# Patient Record
Sex: Female | Born: 1960 | Race: Black or African American | Hispanic: No | Marital: Single | Smoking: Former smoker
Health system: Southern US, Community
[De-identification: ages and names within clinical notes are randomized; demographics above are authoritative.]

## PROBLEM LIST (undated history)

## (undated) DIAGNOSIS — I509 Heart failure, unspecified: Secondary | ICD-10-CM

## (undated) DIAGNOSIS — F329 Major depressive disorder, single episode, unspecified: Secondary | ICD-10-CM

## (undated) DIAGNOSIS — F32A Depression, unspecified: Secondary | ICD-10-CM

## (undated) DIAGNOSIS — R519 Headache, unspecified: Secondary | ICD-10-CM

## (undated) DIAGNOSIS — I272 Pulmonary hypertension, unspecified: Secondary | ICD-10-CM

## (undated) DIAGNOSIS — G8929 Other chronic pain: Secondary | ICD-10-CM

## (undated) DIAGNOSIS — C310 Malignant neoplasm of maxillary sinus: Secondary | ICD-10-CM

## (undated) DIAGNOSIS — I712 Thoracic aortic aneurysm, without rupture, unspecified: Secondary | ICD-10-CM

## (undated) DIAGNOSIS — C300 Malignant neoplasm of nasal cavity: Secondary | ICD-10-CM

## (undated) DIAGNOSIS — F319 Bipolar disorder, unspecified: Secondary | ICD-10-CM

## (undated) DIAGNOSIS — Z9981 Dependence on supplemental oxygen: Secondary | ICD-10-CM

## (undated) DIAGNOSIS — E662 Morbid (severe) obesity with alveolar hypoventilation: Secondary | ICD-10-CM

## (undated) DIAGNOSIS — M549 Dorsalgia, unspecified: Secondary | ICD-10-CM

## (undated) DIAGNOSIS — R0789 Other chest pain: Secondary | ICD-10-CM

## (undated) DIAGNOSIS — E785 Hyperlipidemia, unspecified: Secondary | ICD-10-CM

## (undated) DIAGNOSIS — I209 Angina pectoris, unspecified: Secondary | ICD-10-CM

## (undated) DIAGNOSIS — R0600 Dyspnea, unspecified: Secondary | ICD-10-CM

## (undated) DIAGNOSIS — M25569 Pain in unspecified knee: Secondary | ICD-10-CM

## (undated) DIAGNOSIS — I2781 Cor pulmonale (chronic): Secondary | ICD-10-CM

## (undated) DIAGNOSIS — R51 Headache: Secondary | ICD-10-CM

## (undated) HISTORY — DX: Malignant neoplasm of nasal cavity: C30.0

## (undated) HISTORY — DX: Depression, unspecified: F32.A

## (undated) HISTORY — DX: Heart failure, unspecified: I50.9

## (undated) HISTORY — DX: Morbid (severe) obesity due to excess calories: E66.01

## (undated) HISTORY — DX: Morbid (severe) obesity with alveolar hypoventilation: E66.2

## (undated) HISTORY — DX: Major depressive disorder, single episode, unspecified: F32.9

## (undated) HISTORY — PX: RIGHT OOPHORECTOMY: SHX2359

## (undated) HISTORY — DX: Other chronic pain: G89.29

## (undated) HISTORY — DX: Bipolar disorder, unspecified: F31.9

## (undated) HISTORY — DX: Dorsalgia, unspecified: M54.9

## (undated) HISTORY — DX: Pain in unspecified knee: M25.569

## (undated) HISTORY — PX: TONSILLECTOMY: SUR1361

---

## 2004-06-22 ENCOUNTER — Emergency Department: Payer: Self-pay | Admitting: General Practice

## 2004-11-20 ENCOUNTER — Emergency Department: Payer: Self-pay | Admitting: Emergency Medicine

## 2004-11-22 ENCOUNTER — Inpatient Hospital Stay: Payer: Self-pay | Admitting: Psychiatry

## 2004-12-03 ENCOUNTER — Inpatient Hospital Stay: Payer: Self-pay | Admitting: Psychiatry

## 2004-12-18 ENCOUNTER — Emergency Department: Payer: Self-pay | Admitting: Emergency Medicine

## 2006-06-14 ENCOUNTER — Emergency Department: Payer: Self-pay | Admitting: Emergency Medicine

## 2006-08-27 ENCOUNTER — Emergency Department: Payer: Self-pay | Admitting: Unknown Physician Specialty

## 2006-12-23 ENCOUNTER — Other Ambulatory Visit: Payer: Self-pay

## 2006-12-23 ENCOUNTER — Inpatient Hospital Stay: Payer: Self-pay | Admitting: Internal Medicine

## 2007-12-22 ENCOUNTER — Emergency Department: Payer: Self-pay | Admitting: Emergency Medicine

## 2008-01-02 ENCOUNTER — Inpatient Hospital Stay: Payer: Self-pay | Admitting: Internal Medicine

## 2008-01-02 ENCOUNTER — Other Ambulatory Visit: Payer: Self-pay

## 2008-06-04 ENCOUNTER — Inpatient Hospital Stay: Payer: Self-pay | Admitting: Internal Medicine

## 2008-06-04 ENCOUNTER — Other Ambulatory Visit: Payer: Self-pay

## 2008-06-10 ENCOUNTER — Emergency Department: Payer: Self-pay | Admitting: Emergency Medicine

## 2008-06-10 ENCOUNTER — Other Ambulatory Visit: Payer: Self-pay

## 2009-12-30 ENCOUNTER — Inpatient Hospital Stay: Payer: Self-pay | Admitting: Internal Medicine

## 2010-03-07 ENCOUNTER — Inpatient Hospital Stay: Payer: Self-pay | Admitting: Internal Medicine

## 2011-11-11 ENCOUNTER — Emergency Department: Payer: Self-pay | Admitting: *Deleted

## 2012-01-18 ENCOUNTER — Emergency Department: Payer: Self-pay | Admitting: Emergency Medicine

## 2012-03-19 ENCOUNTER — Emergency Department: Payer: Self-pay | Admitting: Emergency Medicine

## 2012-03-19 LAB — CBC
HCT: 37.5 % (ref 35.0–47.0)
HGB: 11.3 g/dL — ABNORMAL LOW (ref 12.0–16.0)
MCV: 85 fL (ref 80–100)
RBC: 4.4 10*6/uL (ref 3.80–5.20)
WBC: 12.2 10*3/uL — ABNORMAL HIGH (ref 3.6–11.0)

## 2012-03-19 LAB — BASIC METABOLIC PANEL
BUN: 28 mg/dL — ABNORMAL HIGH (ref 7–18)
Chloride: 102 mmol/L (ref 98–107)
Co2: 34 mmol/L — ABNORMAL HIGH (ref 21–32)
Creatinine: 0.89 mg/dL (ref 0.60–1.30)
Potassium: 3.9 mmol/L (ref 3.5–5.1)
Sodium: 142 mmol/L (ref 136–145)

## 2012-03-19 LAB — CK TOTAL AND CKMB (NOT AT ARMC)
CK, Total: 64 U/L (ref 21–215)
CK-MB: 1.3 ng/mL (ref 0.5–3.6)

## 2012-03-19 LAB — TROPONIN I: Troponin-I: <0.02 ng/mL

## 2012-03-25 ENCOUNTER — Emergency Department: Payer: Self-pay | Admitting: Unknown Physician Specialty

## 2012-03-25 LAB — CBC
HCT: 38.8 % (ref 35.0–47.0)
Platelet: 187 10*3/uL (ref 150–440)
RBC: 4.66 10*6/uL (ref 3.80–5.20)
RDW: 16.3 % — ABNORMAL HIGH (ref 11.5–14.5)
WBC: 10 10*3/uL (ref 3.6–11.0)

## 2012-03-25 LAB — URINALYSIS, COMPLETE
Bilirubin,UR: NEGATIVE
Blood: NEGATIVE
Glucose,UR: NEGATIVE mg/dL (ref 0–75)
Ketone: NEGATIVE
Protein: NEGATIVE
Specific Gravity: 1.005 (ref 1.003–1.030)
Squamous Epithelial: 1

## 2012-03-25 LAB — COMPREHENSIVE METABOLIC PANEL
Albumin: 3 g/dL — ABNORMAL LOW (ref 3.4–5.0)
Alkaline Phosphatase: 75 U/L (ref 50–136)
BUN: 15 mg/dL (ref 7–18)
Bilirubin,Total: 0.3 mg/dL (ref 0.2–1.0)
Chloride: 97 mmol/L — ABNORMAL LOW (ref 98–107)
Creatinine: 0.72 mg/dL (ref 0.60–1.30)
SGOT(AST): 18 U/L (ref 15–37)
SGPT (ALT): 22 U/L
Total Protein: 7.1 g/dL (ref 6.4–8.2)

## 2012-03-25 LAB — MAGNESIUM: Magnesium: 1.7 mg/dL — ABNORMAL LOW

## 2012-04-22 ENCOUNTER — Encounter: Payer: Self-pay | Admitting: Cardiovascular Disease

## 2012-04-22 ENCOUNTER — Ambulatory Visit: Payer: Self-pay | Admitting: Cardiovascular Disease

## 2012-04-30 ENCOUNTER — Ambulatory Visit (INDEPENDENT_AMBULATORY_CARE_PROVIDER_SITE_OTHER): Payer: Medicaid Other | Admitting: Cardiovascular Disease

## 2012-04-30 ENCOUNTER — Encounter: Payer: Self-pay | Admitting: Cardiovascular Disease

## 2012-04-30 VITALS — BP 118/70 | HR 78 | Ht 64.0 in | Wt 349.2 lb

## 2012-04-30 DIAGNOSIS — I509 Heart failure, unspecified: Secondary | ICD-10-CM | POA: Insufficient documentation

## 2012-04-30 DIAGNOSIS — R0602 Shortness of breath: Secondary | ICD-10-CM

## 2012-04-30 DIAGNOSIS — R011 Cardiac murmur, unspecified: Secondary | ICD-10-CM

## 2012-04-30 NOTE — Assessment & Plan Note (Signed)
The patient has clinical evidence of congestive heart failure. Ejection fraction is not known. I recommend an echocardiogram. She also has a cardiac murmur suggestive of an aortic etiology but there does not seem to be severe stenosis. Her functional capacity is significantly reduced due to morbid obesity. She appears to be in [place] Heart Association class III. Some of her symptoms also might be related to obesity hypoventilation syndrome. Continue current dose of furosemide. Depending on the echo findings, she might require ischemic cardiac evaluation. However, stress testing might be somewhat limited in her situation due to morbid obesity. Further medications if needed will be added based on the results of the echocardiogram and whether she has LV systolic dysfunction. Low-sodium diet is recommended as well as weight loss.

## 2012-04-30 NOTE — Patient Instructions (Addendum)
Your physician has requested that you have an echocardiogram. Echocardiography is a painless test that uses sound waves to create images of your heart. It provides your doctor with information about the size and shape of your heart and how well your heart's chambers and valves are working. This procedure takes approximately one hour. There are no restrictions for this procedure.  Follow up after echo.  

## 2012-04-30 NOTE — Progress Notes (Signed)
HPI  This is a 51 year old African American female who was referred by Dr. Mayford Knife for evaluation of congestive heart failure. She has chronic medical conditions that include morbid obesity, type 2 diabetes and obesity hypoventilation syndrome with few admissions at [facility] for hypoxia mostly related to this. However, she also has known history of congestive heart failure with unknown ejection fraction. She frequently requires IV diuretics during her hospitalizations. She had some emergency room visits recently to [facility] due to increased dyspnea. BNP was found to be elevated at 450 with chest x-ray finding suggestive of pulmonary vascular congestion. She was given IV furosemide and discharged home. She denies any chest pain. She reports dyspnea with minimal activities as well as orthopnea without PND. She also gets frequent lower extremity edema.  No Known Allergies   Current Outpatient Prescriptions on File Prior to Visit  Medication Sig Dispense Refill  . ARIPiprazole (ABILIFY) 10 MG tablet Take 10 mg by mouth daily.      . divalproex (DEPAKOTE) 500 MG DR tablet Take 500 mg by mouth at bedtime.      . furosemide (LASIX) 40 MG tablet Take 40 mg by mouth 2 (two) times daily.      . metFORMIN (GLUCOPHAGE) 500 MG tablet Take 500 mg by mouth 2 (two) times daily with a meal.         Past Medical History  Diagnosis Date  . Bipolar disorder   . Chronic back pain   . Chronic knee pain   . Morbid obesity   . Obesity hypoventilation syndrome   . CHF (congestive heart failure)   . Depression   . Diabetes mellitus     Type II     Past Surgical History  Procedure Date  . Right oophorectomy      History reviewed. No pertinent family history.   History   Social History  . Marital Status: Unknown    Spouse Name: N/A    Number of Children: N/A  . Years of Education: N/A   Occupational History  . Not on file.   Social History Main Topics  . Smoking status: Former Games developer  .  Smokeless tobacco: Not on file  . Alcohol Use: Yes  . Drug Use: No  . Sexually Active: Not on file   Other Topics Concern  . Not on file   Social History Narrative  . No narrative on file     ROS Constitutional: Negative for fever, chills, diaphoresis, activity change, appetite change.  HENT: Negative for hearing loss, nosebleeds, congestion, sore throat, facial swelling, drooling, trouble swallowing, neck pain, voice change, sinus pressure and tinnitus.  Eyes: Negative for photophobia, pain, discharge and visual disturbance.  Respiratory: Negative for apnea, cough, chest tightness and wheezing.  Cardiovascular: Negative for chest pain, palpitations. Gastrointestinal: Negative for nausea, vomiting, abdominal pain, diarrhea, constipation, blood in stool and abdominal distention.  Genitourinary: Negative for dysuria, urgency, frequency, hematuria and decreased urine volume.  Musculoskeletal: Negative for myalgias, back pain, joint swelling, arthralgias and gait problem.  Skin: Negative for color change, pallor, rash and wound.  Neurological: Negative for dizziness, tremors, seizures, syncope, speech difficulty, weakness, light-headedness, numbness and headaches.  Psychiatric/Behavioral: Negative for suicidal ideas, hallucinations, behavioral problems and agitation. The patient is not nervous/anxious.     PHYSICAL EXAM   BP 118/70  Pulse 78  Ht 5\' 4"  (1.626 m)  Wt 349 lb 4 oz (158.419 kg)  BMI 59.95 kg/m2 Constitutional: She is oriented to person, place, and time.  She  Is morbidly obese. No distress.  HENT: No nasal discharge.  Head: Normocephalic and atraumatic.  Eyes: Pupils are equal and round. Right eye exhibits no discharge. Left eye exhibits no discharge.  Neck: Normal range of motion. Neck supple. No JVD present. No thyromegaly present.  Cardiovascular: Normal rate, regular rhythm, normal heart sounds. Exam reveals no gallop and no friction rub. There is a 2/6 systolic  ejection murmur at the aortic area which is early peaking.  Pulmonary/Chest: Effort normal and breath sounds normal. No stridor. No respiratory distress. She has no wheezes. She has no rales. She exhibits no tenderness.  Abdominal: Soft. Bowel sounds are normal. She exhibits no distension. There is no tenderness. There is no rebound and no guarding.  Musculoskeletal: Normal range of motion. She exhibits trace edema and no tenderness.  Neurological: She is alert and oriented to person, place, and time. Coordination normal.  Skin: Skin is warm and dry. No rash noted. She is not diaphoretic. No erythema. No pallor.  Psychiatric: She has a normal mood and affect. Her behavior is normal. Judgment and thought content normal.     EKG: Sinus  Rhythm   Low voltage in precordial leads.    ASSESSMENT AND PLAN

## 2012-05-07 ENCOUNTER — Other Ambulatory Visit (INDEPENDENT_AMBULATORY_CARE_PROVIDER_SITE_OTHER): Payer: Medicaid Other

## 2012-05-07 ENCOUNTER — Other Ambulatory Visit: Payer: Self-pay

## 2012-05-07 DIAGNOSIS — R0602 Shortness of breath: Secondary | ICD-10-CM

## 2012-05-07 DIAGNOSIS — R011 Cardiac murmur, unspecified: Secondary | ICD-10-CM

## 2012-05-07 DIAGNOSIS — R609 Edema, unspecified: Secondary | ICD-10-CM

## 2012-05-14 ENCOUNTER — Ambulatory Visit: Payer: Medicaid Other | Admitting: Cardiovascular Disease

## 2012-05-30 ENCOUNTER — Ambulatory Visit: Payer: Medicaid Other | Admitting: Cardiovascular Disease

## 2012-06-21 ENCOUNTER — Ambulatory Visit: Payer: Medicaid Other | Admitting: Cardiovascular Disease

## 2012-06-25 ENCOUNTER — Encounter: Payer: Self-pay | Admitting: Cardiovascular Disease

## 2013-12-15 LAB — URINALYSIS, COMPLETE
BACTERIA: NONE SEEN
Bilirubin,UR: NEGATIVE
Blood: NEGATIVE
GLUCOSE, UR: NEGATIVE mg/dL (ref 0–75)
Ketone: NEGATIVE
LEUKOCYTE ESTERASE: NEGATIVE
NITRITE: NEGATIVE
Ph: 6 (ref 4.5–8.0)
Protein: NEGATIVE
RBC,UR: NONE SEEN /HPF (ref 0–5)
Specific Gravity: 1.004 (ref 1.003–1.030)
Squamous Epithelial: NONE SEEN
WBC UR: NONE SEEN /HPF (ref 0–5)

## 2013-12-15 LAB — DRUG SCREEN, URINE

## 2013-12-15 LAB — ETHANOL

## 2013-12-15 LAB — COMPREHENSIVE METABOLIC PANEL
Albumin: 3.4 g/dL (ref 3.4–5.0)
Alkaline Phosphatase: 87 U/L
Anion Gap: 4 — ABNORMAL LOW (ref 7–16)
BUN: 14 mg/dL (ref 7–18)
Bilirubin,Total: 0.3 mg/dL (ref 0.2–1.0)
CALCIUM: 8.8 mg/dL (ref 8.5–10.1)
CREATININE: 0.74 mg/dL (ref 0.60–1.30)
Chloride: 103 mmol/L (ref 98–107)
Co2: 31 mmol/L (ref 21–32)
EGFR (African American): >60
EGFR (Non-African Amer.): >60
Glucose: 115 mg/dL — ABNORMAL HIGH (ref 65–99)
OSMOLALITY: 277 (ref 275–301)
Potassium: 3.4 mmol/L — ABNORMAL LOW (ref 3.5–5.1)
SGOT(AST): 19 U/L (ref 15–37)
SGPT (ALT): 21 U/L (ref 12–78)
Sodium: 138 mmol/L (ref 136–145)
TOTAL PROTEIN: 8.2 g/dL (ref 6.4–8.2)

## 2013-12-15 LAB — CBC
HCT: 42.9 % (ref 35.0–47.0)
HGB: 13.6 g/dL (ref 12.0–16.0)
MCH: 29.2 pg (ref 26.0–34.0)
MCHC: 31.7 g/dL — ABNORMAL LOW (ref 32.0–36.0)
MCV: 92 fL (ref 80–100)
Platelet: 163 10*3/uL (ref 150–440)
RBC: 4.66 10*6/uL (ref 3.80–5.20)
RDW: 15.2 % — AB (ref 11.5–14.5)
WBC: 11.3 10*3/uL — ABNORMAL HIGH (ref 3.6–11.0)

## 2013-12-15 LAB — SALICYLATE LEVEL

## 2013-12-15 LAB — TSH: Thyroid Stimulating Horm: 0.933 u[IU]/mL

## 2013-12-15 LAB — ACETAMINOPHEN LEVEL

## 2013-12-16 ENCOUNTER — Inpatient Hospital Stay: Payer: Self-pay | Admitting: Psychiatry

## 2013-12-18 LAB — LIPID PANEL
Cholesterol: 173 mg/dL (ref 0–200)
HDL Cholesterol: 48 mg/dL (ref 40–60)
Ldl Cholesterol, Calc: 105 mg/dL — ABNORMAL HIGH (ref 0–100)
Triglycerides: 101 mg/dL (ref 0–200)
VLDL Cholesterol, Calc: 20 mg/dL (ref 5–40)

## 2013-12-18 LAB — FOLATE: Folic Acid: 14.3 ng/mL (ref 3.1–100.0)

## 2013-12-18 LAB — HEMOGLOBIN A1C: HEMOGLOBIN A1C: 6.5 % — AB (ref 4.2–6.3)

## 2013-12-22 LAB — BASIC METABOLIC PANEL
Anion Gap: 2 — ABNORMAL LOW (ref 7–16)
BUN: 15 mg/dL (ref 7–18)
CO2: 35 mmol/L — AB (ref 21–32)
Calcium, Total: 9.1 mg/dL (ref 8.5–10.1)
Chloride: 101 mmol/L (ref 98–107)
Creatinine: 0.66 mg/dL (ref 0.60–1.30)
EGFR (African American): >60
EGFR (Non-African Amer.): >60
GLUCOSE: 145 mg/dL — AB (ref 65–99)
Osmolality: 279 (ref 275–301)
Potassium: 3.7 mmol/L (ref 3.5–5.1)
SODIUM: 138 mmol/L (ref 136–145)

## 2014-01-04 ENCOUNTER — Encounter (HOSPITAL_COMMUNITY): Payer: Self-pay | Admitting: Emergency Medicine

## 2014-01-04 ENCOUNTER — Emergency Department (HOSPITAL_COMMUNITY)
Admission: EM | Admit: 2014-01-04 | Discharge: 2014-01-04 | Disposition: A | Discharge status: Home / Self Care | Payer: PRIVATE HEALTH INSURANCE | Attending: Emergency Medicine | Admitting: Emergency Medicine

## 2014-01-04 DIAGNOSIS — R443 Hallucinations, unspecified: Secondary | ICD-10-CM | POA: Insufficient documentation

## 2014-01-04 DIAGNOSIS — Z87891 Personal history of nicotine dependence: Secondary | ICD-10-CM | POA: Insufficient documentation

## 2014-01-04 DIAGNOSIS — Z7982 Long term (current) use of aspirin: Secondary | ICD-10-CM | POA: Insufficient documentation

## 2014-01-04 DIAGNOSIS — G8929 Other chronic pain: Secondary | ICD-10-CM | POA: Insufficient documentation

## 2014-01-04 DIAGNOSIS — I509 Heart failure, unspecified: Secondary | ICD-10-CM | POA: Insufficient documentation

## 2014-01-04 DIAGNOSIS — F319 Bipolar disorder, unspecified: Secondary | ICD-10-CM | POA: Insufficient documentation

## 2014-01-04 DIAGNOSIS — E119 Type 2 diabetes mellitus without complications: Secondary | ICD-10-CM | POA: Insufficient documentation

## 2014-01-04 DIAGNOSIS — Z79899 Other long term (current) drug therapy: Secondary | ICD-10-CM | POA: Insufficient documentation

## 2014-01-04 MED ORDER — LORAZEPAM 1 MG PO TABS
2.0000 mg | ORAL_TABLET | Freq: Once | ORAL | Status: AC
  Administered 2014-01-04: 2 mg via ORAL
  Filled 2014-01-04: qty 2

## 2014-01-04 NOTE — ED Notes (Signed)
Pt is bipolar and has been off depakote for 6 months and abilify for 1 month.  Was kept at [facility] for a week.  Excessively cleaning, not sleeping, hallucinating, mood swings.  States she feels automatic like a robot.  Pt singing "second time around" at the top of her lungs.  Denies SI/HI.  Denies substance abuse.

## 2014-01-04 NOTE — Discharge Instructions (Signed)
Bipolar Disorder °Bipolar disorder is a mental illness. The term bipolar disorder actually is used to describe a group of disorders that all share varying degrees of emotional highs and lows that can interfere with daily functioning, such as work, school, or relationships. Bipolar disorder also can lead to drug abuse, hospitalization, and suicide. °The emotional highs of bipolar disorder are periods of elation or irritability and high energy. These highs can range from a mild form (hypomania) to a severe form (mania). People experiencing episodes of hypomania may appear energetic, excitable, and highly productive. People experiencing mania may behave impulsively or erratically. They often make poor decisions. They may have difficulty sleeping. The most severe episodes of mania can involve having very distorted beliefs or perceptions about the world and seeing or hearing things that are not real (psychotic delusions and hallucinations).  °The emotional lows of bipolar disorder (depression) also can range from mild to severe. Severe episodes of bipolar depression can involve psychotic delusions and hallucinations. °Sometimes people with bipolar disorder experience a state of mixed mood. Symptoms of hypomania or mania and depression are both present during this mixed-mood episode. °SIGNS AND SYMPTOMS °There are signs and symptoms of the episodes of hypomania and mania as well as the episodes of depression. The signs and symptoms of hypomania and mania are similar but vary in severity. They include: °· Inflated self-esteem or feeling of increased self-confidence. °· Decreased need for sleep. °· Unusual talkativeness (rapid or pressured speech) or the feeling of a need to keep talking. °· Sensation of racing thoughts or constant talking, with quick shifts between topics that may or may not be related (flight of ideas). °· Decreased ability to focus or concentrate. °· Increased purposeful activity, such as work, studies,  or social activity, or nonproductive activity, such as pacing, squirming and fidgeting, or finger and toe tapping. °· Impulsive behavior and use of poor judgment, resulting in high-risk activities, such as having unprotected sex or spending excessive amounts of money. °Signs and symptoms of depression include the following:  °· Feelings of sadness, hopelessness, or helplessness. °· Frequent or uncontrollable episodes of crying. °· Lack of feeling anything or caring about anything. °· Difficulty sleeping or sleeping too much.  °· Inability to enjoy the things you used to enjoy.   °· Desire to be alone all the time.   °· Feelings of guilt or worthlessness.  °· Lack of energy or motivation.   °· Difficulty concentrating, remembering, or making decisions.  °· Change in appetite or weight beyond normal fluctuations. °· Thoughts of death or the desire to harm yourself. °DIAGNOSIS  °Bipolar disorder is diagnosed through an assessment by your caregiver. Your caregiver will ask questions about your emotional episodes. There are two main types of bipolar disorder. People with type I bipolar disorder have manic episodes with or without depressive episodes. People with type II bipolar disorder have hypomanic episodes and major depressive episodes, which are more serious than mild depression. The type of bipolar disorder you have can make an important difference in how your illness is monitored and treated. °Your caregiver may ask questions about your medical history and use of alcohol or drugs, including prescription medication. Certain medical conditions and substances also can cause emotional highs and lows that resemble bipolar disorder (secondary bipolar disorder).  °TREATMENT  °Bipolar disorder is a long-term illness. It is best controlled with continuous treatment rather than treatment only when symptoms occur. The following treatments can be prescribed for bipolar disorders: °· Medication Medication can be prescribed by  a doctor   that is an expert in treating mental disorders (psychiatrists). Medications called mood stabilizers are usually prescribed to help control the illness. Other medications are sometimes added if symptoms of mania, depression, or psychotic delusions and hallucinations occur despite the use of a mood stabilizer. °· Talk therapy Some forms of talk therapy are helpful in providing support, education, and guidance. °A combination of medication and talk therapy is best for managing the disorder over time. A procedure in which electricity is applied to your brain through your scalp (electroconvulsive therapy) is used in cases of severe mania when medication and talk therapy do not work or work too slowly. °Document Released: 12/11/2000 Document Revised: 12/30/2012 Document Reviewed: 09/30/2012 °ExitCare® Patient Information ©2014 [employer]. ° °

## 2014-01-04 NOTE — ED Provider Notes (Signed)
Medical screening examination/treatment/procedure(s) were conducted as a shared visit with non-physician practitioner(s) and myself.  I personally evaluated the patient during the encounter.   EKG Interpretation None      Please see my above dictated note.  Tanna Furry, MD 01/04/14 [phone]

## 2014-01-04 NOTE — ED Provider Notes (Signed)
CSN: [id number]     Arrival date & time 01/04/14  1619 History   First MD Initiated Contact with Patient 01/04/14 1638     Chief Complaint  Patient presents with  . Medical Clearance     (Consider location/radiation/quality/duration/timing/severity/associated sxs/prior Treatment) HPI Comments: Patient is a 53 year old female a history of bipolar disorder who presents to the emergency department for bizarre behavior and medical clearance. Son states that patient lives with her daughter, his sister, and that she has been exhibiting bizarre behaviors which are typical when she is noncompliant with her medications. Son states that patient was hospitalized at [facility] for one week for medication noncompliance. Patient was placed back on her Abilify and discharged 2 weeks ago, but son states that this has not helped. He states that his mother is supposed to also be taking Depakote 500 mg twice a day which she has not been taking. He states that patient has been leaving the house a mess, not showering, hallucinating, and exhibiting mood swings. Patient denies SI/HI, A/V hallucinations, etoh use and illicit drug use. She state she does not like how her medications make her feel. Patient's son states she has psychiatric follow up scheduled for tomorrow. When asked why patient could not make it to this appointment he states, "My sister called me crying that things were getting worse and I couldn't not take action".  The history is provided by the patient and a relative. No language interpreter was used.    Past Medical History  Diagnosis Date  . Bipolar disorder   . Chronic back pain   . Chronic knee pain   . Morbid obesity   . Obesity hypoventilation syndrome   . Depression   . Diabetes mellitus     Type II  . CHF (congestive heart failure)    Past Surgical History  Procedure Laterality Date  . Right oophorectomy     No family history on file. History  Substance Use Topics  .  Smoking status: Former Research scientist (life sciences)  . Smokeless tobacco: Not on file  . Alcohol Use: Yes   OB History   Grav Para Term Preterm Abortions TAB SAB Ect Mult Living                  Review of Systems  Psychiatric/Behavioral: Positive for hallucinations and behavioral problems.  All other systems reviewed and are negative.    Allergies  Review of patient's allergies indicates no known allergies.  Home Medications   Prior to Admission medications   Medication Sig Start Date End Date Taking? Authorizing Provider  ARIPiprazole (ABILIFY) 10 MG tablet Take 10 mg by mouth daily.    Historical Provider, MD  aspirin 500 MG tablet Take 500 mg by mouth every 6 (six) hours as needed.    Historical Provider, MD  divalproex (DEPAKOTE) 500 MG DR tablet Take 500 mg by mouth at bedtime.    Historical Provider, MD  furosemide (LASIX) 40 MG tablet Take 40 mg by mouth 2 (two) times daily.    Historical Provider, MD  metFORMIN (GLUCOPHAGE) 500 MG tablet Take 500 mg by mouth 2 (two) times daily with a meal.    Historical Provider, MD   BP 126/69  Pulse 87  Temp(Src) 97.1 F (36.2 C) (Oral)  Resp 18  SpO2 95%  Physical Exam  Nursing note and vitals reviewed. Constitutional: She is oriented to person, place, and time. She appears well-developed and well-nourished. No distress.  HENT:  Head: Normocephalic and atraumatic.  Eyes: Conjunctivae and EOM are normal. No scleral icterus.  Neck: Normal range of motion.  Cardiovascular: Normal rate, regular rhythm and normal heart sounds.   Pulmonary/Chest: Effort normal and breath sounds normal. No respiratory distress. She has no wheezes. She has no rales.  Musculoskeletal: Normal range of motion.  Neurological: She is alert and oriented to person, place, and time.  GCS 15. Speech is goal oriented. Patient answers questions appropriately and follows simple commands. She is alert and oriented to person, place, and time. She moves her extremities without ataxia.   Skin: Skin is warm and dry. No rash noted. She is not diaphoretic. No erythema. No pallor.  Psychiatric: She has a normal mood and affect. Her speech is normal. She is not actively hallucinating. Cognition and memory are normal. She expresses no homicidal and no suicidal ideation. She expresses no suicidal plans and no homicidal plans.    ED Course  Procedures (including critical care time) Labs Review Labs Reviewed - No data to display  Imaging Review No results found.   EKG Interpretation None      MDM   Final diagnoses:  Bipolar disorder    53 year old female with a history of bipolar disorder presents to the emergency department for bizarre behavior. Son states that patient is supposed to be taking Depakote as well as Abilify, but she has been noncompliant with the Depakote secondary to side effects. Patient states that she does not like how the medication makes her feel. Son endorses patient acting more messy than usual and not showering frequently. Patient denies SI/HI, A/V hallucinations, alcohol, illicit drug use. She has followup scheduled with a psychiatrist tomorrow. I do not believe the patient to be a harm to herself or others. I do not believe she meets criteria for inpatient treatment. I consulted with my attending, Dr. Jeneen Rinks who agrees that patient is appropriate for discharge today. Will give Ativan to help manage symptoms until patient is able to followup with her psychiatrist tomorrow. Return precautions discussed with the son and patient who verbalize comfort and understanding with this plan with no unaddressed concerns.   Filed Vitals:   01/04/14 1631  BP: 126/69  Pulse: 87  Temp: 97.1 F (36.2 C)  TempSrc: Oral  Resp: 18  SpO2: 95%     Antonietta Breach, PA-C 01/04/14 1807

## 2014-01-04 NOTE — ED Provider Notes (Addendum)
Medical screening examination/treatment/procedure(s) were conducted as a shared visit with non-physician practitioner(s) and myself.  I personally evaluated the patient during the encounter.   EKG Interpretation None      She's seen and examined. I reviewed her history. Discharge from [facility] 2 weeks ago. Is compliant with her Abilify and her diabetic meds. Is not taking Depakote. She states that she was not told that she had to take the Depakote. Her son actually reiterates this. Her son, Judeth Porch, states that she had done well on Monotherapy with just Abilify while inpatient and was going to recheck how she was doing before the Depakote was reinstituted.  This recheck appointment is tomorrow. She's not homicidal, she is not suicidal. She is not gravely disabled. She admits that she has difficulty as her daughter now has a boyfriend and states that her boyfriend takes "a lot of the time she used to give to me".     Patient does not meet criteria for a hold. Do not feel she needs psychiatric evaluation now. I talked to her, her son, and then the 2 together. Plan is to give her a single dose of by mouth Ativan tonight to breath" feeling" on her bipolar disorder tonight. Feel that she is on appropriate "floor" meds with her Abilify. She may be getting manic at times. She is not so right now. She denies hallucinations. She is oriented lucid and conversant with me.  Tanna Furry, MD 01/04/14 [place], MD 01/04/14 [phone]

## 2014-01-14 ENCOUNTER — Emergency Department: Payer: Self-pay | Admitting: Emergency Medicine

## 2014-01-14 LAB — DRUG SCREEN, URINE

## 2014-01-14 LAB — COMPREHENSIVE METABOLIC PANEL
ALBUMIN: 2.8 g/dL — AB (ref 3.4–5.0)
ALT: 24 U/L (ref 12–78)
ANION GAP: 7 (ref 7–16)
AST: 16 U/L (ref 15–37)
Alkaline Phosphatase: 74 U/L
BUN: 15 mg/dL (ref 7–18)
Bilirubin,Total: 0.4 mg/dL (ref 0.2–1.0)
CREATININE: 0.6 mg/dL (ref 0.60–1.30)
Calcium, Total: 8.9 mg/dL (ref 8.5–10.1)
Chloride: 100 mmol/L (ref 98–107)
Co2: 32 mmol/L (ref 21–32)
EGFR (African American): >60
EGFR (Non-African Amer.): >60
Glucose: 165 mg/dL — ABNORMAL HIGH (ref 65–99)
Osmolality: 282 (ref 275–301)
Potassium: 3.3 mmol/L — ABNORMAL LOW (ref 3.5–5.1)
Sodium: 139 mmol/L (ref 136–145)
Total Protein: 7.3 g/dL (ref 6.4–8.2)

## 2014-01-14 LAB — URINALYSIS, COMPLETE
BILIRUBIN, UR: NEGATIVE
BLOOD: NEGATIVE
Glucose,UR: NEGATIVE mg/dL (ref 0–75)
KETONE: NEGATIVE
Leukocyte Esterase: NEGATIVE
NITRITE: NEGATIVE
PROTEIN: NEGATIVE
Ph: 5 (ref 4.5–8.0)
RBC,UR: 1 /HPF (ref 0–5)
Specific Gravity: 1.028 (ref 1.003–1.030)
Squamous Epithelial: 2

## 2014-01-14 LAB — CBC
HCT: 39.8 % (ref 35.0–47.0)
HGB: 12.5 g/dL (ref 12.0–16.0)
MCH: 29.3 pg (ref 26.0–34.0)
MCHC: 31.4 g/dL — AB (ref 32.0–36.0)
MCV: 93 fL (ref 80–100)
PLATELETS: 163 10*3/uL (ref 150–440)
RBC: 4.26 10*6/uL (ref 3.80–5.20)
RDW: 15 % — ABNORMAL HIGH (ref 11.5–14.5)
WBC: 9 10*3/uL (ref 3.6–11.0)

## 2014-01-14 LAB — ETHANOL
Ethanol %: <0.003 % (ref 0.000–0.080)
Ethanol: <3 mg/dL

## 2014-01-14 LAB — SALICYLATE LEVEL: Salicylates, Serum: <1.7 mg/dL

## 2014-01-14 LAB — ACETAMINOPHEN LEVEL: Acetaminophen: <2 ug/mL

## 2014-03-04 ENCOUNTER — Inpatient Hospital Stay: Payer: Self-pay | Admitting: Internal Medicine

## 2014-03-04 LAB — COMPREHENSIVE METABOLIC PANEL
ALBUMIN: 2.8 g/dL — AB (ref 3.4–5.0)
AST: 19 U/L (ref 15–37)
Alkaline Phosphatase: 71 U/L
Anion Gap: 5 — ABNORMAL LOW (ref 7–16)
BUN: 22 mg/dL — ABNORMAL HIGH (ref 7–18)
Bilirubin,Total: 0.2 mg/dL (ref 0.2–1.0)
CALCIUM: 8.1 mg/dL — AB (ref 8.5–10.1)
CO2: 36 mmol/L — AB (ref 21–32)
CREATININE: 0.73 mg/dL (ref 0.60–1.30)
Chloride: 103 mmol/L (ref 98–107)
Glucose: 90 mg/dL (ref 65–99)
Osmolality: 290 (ref 275–301)
POTASSIUM: 3.7 mmol/L (ref 3.5–5.1)
SGPT (ALT): 18 U/L (ref 12–78)
Sodium: 144 mmol/L (ref 136–145)
TOTAL PROTEIN: 6.9 g/dL (ref 6.4–8.2)

## 2014-03-04 LAB — CBC
HCT: 39.4 % (ref 35.0–47.0)
HGB: 12.2 g/dL (ref 12.0–16.0)
MCH: 28.6 pg (ref 26.0–34.0)
MCHC: 31 g/dL — ABNORMAL LOW (ref 32.0–36.0)
MCV: 93 fL (ref 80–100)
Platelet: 173 10*3/uL (ref 150–440)
RBC: 4.26 10*6/uL (ref 3.80–5.20)
RDW: 15.1 % — AB (ref 11.5–14.5)
WBC: 8.8 10*3/uL (ref 3.6–11.0)

## 2014-03-04 LAB — CK TOTAL AND CKMB (NOT AT ARMC)
CK, TOTAL: 74 U/L
CK, TOTAL: 48 U/L
CK-MB: 0.8 ng/mL (ref 0.5–3.6)
CK-MB: 0.6 ng/mL (ref 0.5–3.6)

## 2014-03-04 LAB — TROPONIN I: Troponin-I: <0.02 ng/mL

## 2014-03-04 LAB — PRO B NATRIURETIC PEPTIDE: B-TYPE NATIURETIC PEPTID: 163 pg/mL — AB (ref 0–125)

## 2014-03-05 LAB — CBC WITH DIFFERENTIAL/PLATELET
BASOS ABS: 0 10*3/uL (ref 0.0–0.1)
BASOS PCT: 0.5 %
EOS ABS: 0.1 10*3/uL (ref 0.0–0.7)
EOS PCT: 1.7 %
HCT: 40.2 % (ref 35.0–47.0)
HGB: 12.7 g/dL (ref 12.0–16.0)
LYMPHS PCT: 13.8 %
Lymphocyte #: 1.2 10*3/uL (ref 1.0–3.6)
MCH: 29 pg (ref 26.0–34.0)
MCHC: 31.6 g/dL — ABNORMAL LOW (ref 32.0–36.0)
MCV: 92 fL (ref 80–100)
MONOS PCT: 9.6 %
Monocyte #: 0.8 x10 3/mm (ref 0.2–0.9)
NEUTROS ABS: 6.4 10*3/uL (ref 1.4–6.5)
Neutrophil %: 74.4 %
PLATELETS: 175 10*3/uL (ref 150–440)
RBC: 4.38 10*6/uL (ref 3.80–5.20)
RDW: 15 % — ABNORMAL HIGH (ref 11.5–14.5)
WBC: 8.6 10*3/uL (ref 3.6–11.0)

## 2014-03-05 LAB — BASIC METABOLIC PANEL
Anion Gap: 2 — ABNORMAL LOW (ref 7–16)
BUN: 16 mg/dL (ref 7–18)
CHLORIDE: 96 mmol/L — AB (ref 98–107)
CREATININE: 0.6 mg/dL (ref 0.60–1.30)
Calcium, Total: 8.7 mg/dL (ref 8.5–10.1)
Co2: 39 mmol/L — ABNORMAL HIGH (ref 21–32)
EGFR (African American): >60
Glucose: 117 mg/dL — ABNORMAL HIGH (ref 65–99)
Osmolality: 276 (ref 275–301)
Potassium: 3.7 mmol/L (ref 3.5–5.1)
Sodium: 137 mmol/L (ref 136–145)

## 2014-04-16 ENCOUNTER — Emergency Department: Payer: Self-pay | Admitting: Emergency Medicine

## 2014-04-16 LAB — CBC WITH DIFFERENTIAL/PLATELET
BASOS ABS: 0.1 10*3/uL (ref 0.0–0.1)
Basophil %: 0.9 %
EOS PCT: 1.3 %
Eosinophil #: 0.1 10*3/uL (ref 0.0–0.7)
HCT: 42.7 % (ref 35.0–47.0)
HGB: 13.2 g/dL (ref 12.0–16.0)
LYMPHS PCT: 18.5 %
Lymphocyte #: 1.5 10*3/uL (ref 1.0–3.6)
MCH: 28.3 pg (ref 26.0–34.0)
MCHC: 30.8 g/dL — ABNORMAL LOW (ref 32.0–36.0)
MCV: 92 fL (ref 80–100)
MONO ABS: 0.7 x10 3/mm (ref 0.2–0.9)
Monocyte %: 8.2 %
NEUTROS ABS: 5.7 10*3/uL (ref 1.4–6.5)
Neutrophil %: 71.1 %
Platelet: 122 10*3/uL — ABNORMAL LOW (ref 150–440)
RBC: 4.64 10*6/uL (ref 3.80–5.20)
RDW: 16 % — ABNORMAL HIGH (ref 11.5–14.5)
WBC: 8 10*3/uL (ref 3.6–11.0)

## 2014-04-16 LAB — BASIC METABOLIC PANEL
Anion Gap: 6 — ABNORMAL LOW (ref 7–16)
BUN: 22 mg/dL — ABNORMAL HIGH (ref 7–18)
CALCIUM: 8.7 mg/dL (ref 8.5–10.1)
CHLORIDE: 104 mmol/L (ref 98–107)
CO2: 33 mmol/L — AB (ref 21–32)
Creatinine: 0.76 mg/dL (ref 0.60–1.30)
GLUCOSE: 145 mg/dL — AB (ref 65–99)
OSMOLALITY: 291 (ref 275–301)
Potassium: 3.4 mmol/L — ABNORMAL LOW (ref 3.5–5.1)
Sodium: 143 mmol/L (ref 136–145)

## 2014-11-14 ENCOUNTER — Encounter (HOSPITAL_COMMUNITY): Payer: Self-pay | Admitting: Emergency Medicine

## 2014-11-14 ENCOUNTER — Emergency Department (HOSPITAL_COMMUNITY)
Admission: EM | Admit: 2014-11-14 | Discharge: 2014-11-14 | Disposition: A | Discharge status: Home / Self Care | Payer: Medicare Other | Attending: Emergency Medicine | Admitting: Emergency Medicine

## 2014-11-14 DIAGNOSIS — F319 Bipolar disorder, unspecified: Secondary | ICD-10-CM | POA: Insufficient documentation

## 2014-11-14 DIAGNOSIS — Z792 Long term (current) use of antibiotics: Secondary | ICD-10-CM | POA: Insufficient documentation

## 2014-11-14 DIAGNOSIS — Z7982 Long term (current) use of aspirin: Secondary | ICD-10-CM | POA: Diagnosis not present

## 2014-11-14 DIAGNOSIS — K029 Dental caries, unspecified: Secondary | ICD-10-CM | POA: Insufficient documentation

## 2014-11-14 DIAGNOSIS — Z79899 Other long term (current) drug therapy: Secondary | ICD-10-CM | POA: Insufficient documentation

## 2014-11-14 DIAGNOSIS — Z87891 Personal history of nicotine dependence: Secondary | ICD-10-CM | POA: Insufficient documentation

## 2014-11-14 DIAGNOSIS — I509 Heart failure, unspecified: Secondary | ICD-10-CM | POA: Diagnosis not present

## 2014-11-14 DIAGNOSIS — G8929 Other chronic pain: Secondary | ICD-10-CM | POA: Diagnosis not present

## 2014-11-14 DIAGNOSIS — Z791 Long term (current) use of non-steroidal anti-inflammatories (NSAID): Secondary | ICD-10-CM | POA: Diagnosis not present

## 2014-11-14 DIAGNOSIS — E119 Type 2 diabetes mellitus without complications: Secondary | ICD-10-CM | POA: Insufficient documentation

## 2014-11-14 DIAGNOSIS — Z880 Allergy status to penicillin: Secondary | ICD-10-CM | POA: Diagnosis not present

## 2014-11-14 DIAGNOSIS — K088 Other specified disorders of teeth and supporting structures: Secondary | ICD-10-CM | POA: Diagnosis present

## 2014-11-14 MED ORDER — TRAMADOL HCL 50 MG PO TABS
50.0000 mg | ORAL_TABLET | Freq: Once | ORAL | Status: AC
  Administered 2014-11-14: 50 mg via ORAL
  Filled 2014-11-14: qty 1

## 2014-11-14 MED ORDER — TRAMADOL HCL 50 MG PO TABS: 50.0000 mg | ORAL_TABLET | Freq: Four times a day (QID) | ORAL | Status: DC | PRN

## 2014-11-14 NOTE — ED Provider Notes (Signed)
CSN: [id number]     Arrival date & time 11/14/14  0103 History   First MD Initiated Contact with Patient 11/14/14 0136     Chief Complaint  Patient presents with  . Dental Pain     (Consider location/radiation/quality/duration/timing/severity/associated sxs/prior Treatment) Patient is a 54 y.o. female presenting with tooth pain. The history is provided by the patient.  Dental Pain Location:  Upper Upper teeth location:  15/LU 2nd molar and 14/LU 1st molar Quality:  Dull Severity:  Severe Onset quality:  Gradual Timing:  Constant Progression:  Unchanged Chronicity:  New Context: dental caries   Context: not abscess   Relieved by:  Nothing Worsened by:  Nothing tried Ineffective treatments:  None tried Associated symptoms: no congestion and no trismus   Risk factors: diabetes     Past Medical History  Diagnosis Date  . Bipolar disorder   . Chronic back pain   . Chronic knee pain   . Morbid obesity   . Obesity hypoventilation syndrome   . Depression   . Diabetes mellitus     Type II  . CHF (congestive heart failure)    Past Surgical History  Procedure Laterality Date  . Right oophorectomy     History reviewed. No pertinent family history. History  Substance Use Topics  . Smoking status: Former Research scientist (life sciences)  . Smokeless tobacco: Not on file  . Alcohol Use: Yes   OB History    No data available     Review of Systems  HENT: Negative for congestion.   All other systems reviewed and are negative.     Allergies  Penicillins  Home Medications   Prior to Admission medications   Medication Sig Start Date End Date Taking? Authorizing Provider  ARIPiprazole (ABILIFY) 10 MG tablet Take 10 mg by mouth daily.   Yes Historical Provider, MD  aspirin 500 MG tablet Take 500 mg by mouth every 6 (six) hours as needed for pain.     Historical Provider, MD  clindamycin (CLEOCIN) 150 MG capsule Take 150 mg by mouth 3 (three) times daily.   Yes Historical Provider, MD    divalproex (DEPAKOTE) 500 MG DR tablet Take 500 mg by mouth at bedtime.   Yes Historical Provider, MD  furosemide (LASIX) 40 MG tablet Take 40 mg by mouth 2 (two) times daily.   Yes Historical Provider, MD  metFORMIN (GLUCOPHAGE) 500 MG tablet Take 500 mg by mouth 2 (two) times daily with a meal.   Yes Historical Provider, MD  naproxen sodium (ANAPROX) 220 MG tablet Take 220 mg by mouth 2 (two) times daily with a meal.   Yes Historical Provider, MD   BP 152/92 mmHg  Pulse 70  Temp(Src) 98 F (36.7 C) (Oral)  Resp 20  SpO2 98% Physical Exam  Constitutional: She is oriented to person, place, and time. She appears well-developed and well-nourished. No distress.  HENT:  Head: Normocephalic and atraumatic.  Mouth/Throat: Oropharynx is clear and moist. No trismus in the jaw. No oropharyngeal exudate.    Eyes: Conjunctivae are normal. Pupils are equal, round, and reactive to light.  Neck: Normal range of motion. Neck supple.  Cardiovascular: Normal rate, regular rhythm, normal heart sounds and intact distal pulses.   Pulmonary/Chest: Effort normal and breath sounds normal. No respiratory distress. She has no wheezes. She has no rales.  Abdominal: Soft. Bowel sounds are normal. There is no tenderness. There is no rebound and no guarding.  Musculoskeletal: Normal range of motion.  Neurological: She is  alert and oriented to person, place, and time.  Skin: Skin is warm and dry.  Psychiatric: She has a normal mood and affect.    ED Course  Procedures (including critical care time) Labs Review Labs Reviewed - No data to display  Imaging Review No results found.   EKG Interpretation None      MDM   Final diagnoses:  None    Follow up with dentistry will rx for pain medication.      Carlisle Beers, MD 11/14/14 [phone]

## 2014-11-14 NOTE — Discharge Instructions (Signed)
Dental Care and Dentist Visits  Dental care supports good overall health. Regular dental visits can also help you avoid dental pain, bleeding, infection, and other more serious health problems in the future. It is important to keep the mouth healthy because diseases in the teeth, gums, and other oral tissues can spread to other areas of the body. Some problems, such as diabetes, heart disease, and pre-term labor have been associated with poor oral health.   See your dentist every 6 months. If you experience emergency problems such as a toothache or broken tooth, go to the dentist right away. If you see your dentist regularly, you may catch problems early. It is easier to be treated for problems in the early stages.   WHAT TO EXPECT AT A DENTIST VISIT   Your dentist will look for many common oral health problems and recommend proper treatment. At your regular dental visit, you can expect:  · Gentle cleaning of the teeth and gums. This includes scraping and polishing. This helps to remove the sticky substance around the teeth and gums (plaque). Plaque forms in the mouth shortly after eating. Over time, plaque hardens on the teeth as tartar. If tartar is not removed regularly, it can cause problems. Cleaning also helps remove stains.  · Periodic X-rays. These pictures of the teeth and supporting bone will help your dentist assess the health of your teeth.  · Periodic fluoride treatments. Fluoride is a natural mineral shown to help strengthen teeth. Fluoride treatment involves applying a fluoride gel or varnish to the teeth. It is most commonly done in children.  · Examination of the mouth, tongue, jaws, teeth, and gums to look for any oral health problems, such as:  ¨ Cavities (dental caries). This is decay on the tooth caused by plaque, sugar, and acid in the mouth. It is best to catch a cavity when it is small.  ¨ Inflammation of the gums caused by plaque buildup (gingivitis).  ¨ Problems with the mouth or malformed  or misaligned teeth.  ¨ Oral cancer or other diseases of the soft tissues or jaws.   KEEP YOUR TEETH AND GUMS HEALTHY  For healthy teeth and gums, follow these general guidelines as well as your dentist's specific advice:  · Have your teeth professionally cleaned at the dentist every 6 months.  · Brush twice daily with a fluoride toothpaste.  · Floss your teeth daily.   · Ask your dentist if you need fluoride supplements, treatments, or fluoride toothpaste.  · Eat a healthy diet. Reduce foods and drinks with added sugar.  · Avoid smoking.  TREATMENT FOR ORAL HEALTH PROBLEMS  If you have oral health problems, treatment varies depending on the conditions present in your teeth and gums.  · Your caregiver will most likely recommend good oral hygiene at each visit.  · For cavities, gingivitis, or other oral health disease, your caregiver will perform a procedure to treat the problem. This is typically done at a separate appointment. Sometimes your caregiver will refer you to another dental specialist for specific tooth problems or for surgery.  SEEK IMMEDIATE DENTAL CARE IF:  · You have pain, bleeding, or soreness in the gum, tooth, jaw, or mouth area.  · A permanent tooth becomes loose or separated from the gum socket.  · You experience a blow or injury to the mouth or jaw area.  Document Released: 05/17/2011 Document Revised: 11/27/2011 Document Reviewed: 05/17/2011  [employer]® Patient Information ©2015 [employer]. This information is not intended to replace advice   given to you by your health care provider. Make sure you discuss any questions you have with your health care provider.

## 2014-11-14 NOTE — ED Notes (Signed)
Pt c/o L upper molar pain, pt states she was seen by dentist yesterday and given scripts for pain and antbx, pt got antbx filled but states she did not get pain meds filled fear of taking tylenol. Now pain is much worse.

## 2015-01-09 NOTE — Consult Note (Signed)
PATIENT NAME:  Jodi [name], Jodi [name] MR#:  [id number] DATE OF BIRTH:  Apr 12, 1961  DATE OF CONSULTATION:  12/16/2013  REFERRING PHYSICIAN:  ED physician  CONSULTING PHYSICIAN:  Cordelia Pen. Gretel Acre, MD  HISTORY OF PRESENT ILLNESS:  The patient is a 54 year old African American female who presented to the ED as she reported that, "I don't know why I'm here. I asked my daughter, [name]."  The patient has history of schizophrenia and she was very disorganized, looking about the room and playing with the socks in her hand and twirling over her head like a lasso. The patient stated that she has a mental illness and she has bipolar and does not take any psychiatric medications. The patient then stood up and tore off her linens from the bed and throwing them on the ground.   During my interview, the patient reported that she has history of manic depression and she is currently at the hospital. She reported that she was doing things at her daughter's home which she was not supposed to do. When asked about the same, she reported that she was cleaning mostly. However, she cleans good and sometimes she does a lot of cleaning. She was pointing towards spring cleaning. The patient reported that she was not getting help and was losing control. The patient reported that she was not acting right and then she was brought to the hospital by her daughter, [name]. Reported that she sleeps well and does not have any auditory or visual hallucinations. She stated that she is not taking any psychotropic medications at this time.   PAST PSYCHIATRIC HISTORY: The patient reported that she has history of psychiatric hospitalization in the past. She was at [facility] in 2006. She has been diagnosed with bipolar disorder. She was also admitted to [facility] 8 years ago. Reported that she has tried lithium, Seroquel, Risperdal. Reported that she was given Depakote but she does not remember the response to the medications. She stated that she is currently  taking Abilify, which was prescribed a long time ago and she does well on the medication.   PAST MEDICAL HISTORY: Non-insulin-dependent diabetes, morbid obesity.   FAMILY HISTORY: The patient reported that her 2 sisters have history of bipolar disorder. Her father has also history of bipolar disorder.   ALLERGIES: Currently denied any drug allergies.   SOCIAL HISTORY: The patient reported that she lives with her daughter, [name]. She has never been married. Reported that she used to work in Forensic scientist, including Psychologist, counselling, [employer]. She has been on disability at this time. She currently denied access to guns at home. She denied using any drugs including cigarettes.   REVIEW OF SYSTEMS:   CONSTITUTIONAL: Denies any fever or chills. No weight changes.  EYES: No double or blurred vision.  RESPIRATORY: No shortness of breath or cough.  CARDIOVASCULAR: No chest pain or orthopnea.  GASTROINTESTINAL: No abdominal pain, nausea, vomiting or diarrhea.  GENITOURINARY: No incontinence or frequency.  ENDOCRINE: No heat or cold intolerance.  LYMPHATIC: No anemia or easy bruising.  INTEGUMENTARY: No acne or rash.   VITAL SIGNS: Temperature 98.4, pulse 80, respirations 22, blood pressure 124/68.   LABORATORY DATA:  Glucose 115, BUN 14, creatinine 0.74, sodium 138, potassium 3.4, chloride 103, bicarbonate 31, GFR 60, anion gap 4. Blood alcohol less than 3. Protein 8.2, albumin 3.4, bilirubin 0.3, alkaline phosphatase 87, AST 19, ALT 21. TSH 0.933. UDS is negative. WBC 11.3, RBC 4.6, hemoglobin 13.6, hematocrit 42.9, MCV is 92.  MENTAL STATUS EXAMINATION: The patient is a morbidly obese female who was sitting in the chair. She was covering herself with a bedsheet. She maintained fair eye contact. Her speech was low in tone and volume. Mood was depressed and anxious at this time, however, she was manic earlier. Her language was appropriate. Fund of knowledge seems intact. She denied  having any perceptual disturbances. She denied having any suicidal or homicidal ideations or plans. Her memory seemed intact.   DIAGNOSTIC IMPRESSION: AXIS I: Bipolar 1 disorder, most recent episode manic, severe, without psychotic features.  AXIS II: None.  AXIS III: Morbid obesity, diabetes mellitus, hypertension,  AXIS IV: Severe.  AXIS V: Current global assessment of functioning 25.   TREATMENT PLAN: 1.  The patient is currently on involuntary commitment and will be admitted to the [facility] for stabilization and safety.  2.  I will start her on the Abilify 10 mg p.o. daily.  3.  Treatment team will obtain collateral information from her family members.  4.  She will be monitored closely by the staff.   Thank you for allowing me to participate in the care of this patient.   ____________________________ Cordelia Pen. Gretel Acre, MD usf:cs D: 12/16/2013 16:00:36 ET T: 12/16/2013 18:36:06 ET JOB#: [id number]  cc: Cordelia Pen. Gretel Acre, MD, <Dictator> Jeronimo Norma MD ELECTRONICALLY SIGNED 12/20/2013 12:31

## 2015-01-09 NOTE — Consult Note (Signed)
PATIENT NAME:  Jodi [name], Jodi [name] MR#:  [id number] DATE OF BIRTH:  1961/09/12  DATE OF CONSULTATION:  01/14/2014  REFERRING PHYSICIAN:   CONSULTING PHYSICIAN:  Gonzella Lex, MD  IDENTIFYING INFORMATION AND REASON FOR CONSULT: A 54 year old woman with a history of bipolar disorder who actually came to the Emergency Room for an accidental cut to her wrist. Consultation because of ongoing psychiatric symptoms.   HISTORY OF PRESENT ILLNESS: Information obtained from the patient, from the chart and from conversation with the patient's daughter. The patient gave consent for me to speak with the daughter. The patient and daughter tell a similar story which is that, although she just came home from the hospital about 3 weeks ago, she has continued to have some symptoms characteristic of mania. She has continued to have some hyperactive behavior with a lot of purposeless movement, purposeless cleaning of the house, some agitation. She has frequently been getting paranoid, believing that someone was out to get her or was coming into the house. This has been somewhat disruptive to the household. On the other hand, the patient does have insight into this and the fact that it is delusional and she has been compliant with her medicine and with outpatient treatment. Furthermore, there is no indication that she has done anything acutely dangerous at this point. They are just concerned about her ongoing symptoms.   PAST PSYCHIATRIC HISTORY: Long history of bipolar disorder characterized by multiple manias. The patient denies a history of depression. Has had several hospitalizations over the years. Has had some problems with compliance which have resulted in return of symptoms. No history of suicide.   SOCIAL HISTORY: The patient lives with her daughter and daughter's boyfriend. It sounds like by and large they are getting along okay but with some conflict to be expected and some of it exacerbated by the patient's illness.    PAST MEDICAL HISTORY: The patient is overweight and has been diagnosed with type 2 diabetes. Currently, she also has an acute cut to her wrist which is accidental by her description. Apparently, she was running up a flight of stairs while holding a glass and fell down. It broke and cut her wrist.   CURRENT MEDICATIONS: Different than what she was taking on discharge. Psychiatrically she tells me now she is taking Abilify 10 mg per day and Depakote 500 mg at night. This is slightly different than what she was on when she left the hospital last time but she has seen Dr. Annitta Jersey in the interval.   ALLERGIES: AMOXICILLIN AND PENICILLIN.   REVIEW OF SYSTEMS: The patient admits to feeling paranoid at times. Denies suicidal or homicidal ideation. It sounds like she may occasionally have some hallucinations. Still has some agitated behavior. Feels like she is sleeping reasonably well but admits that she often wakes up very early. Having some pain in her wrist as to be expected from the cut, otherwise unremarkable.   MENTAL STATUS EXAMINATION: The patient interviewed in the Emergency Room. The patient looks her stated age. Cooperative and pleasant with the interview. Good eye contact, normal psychomotor activity. Speech normal rate, tone and volume. Affect euthymic and appropriate. Mood stated as being okay. Thoughts appear to be lucid. No obvious loosening of associations or delusions. Did not make any bizarre statements. Shows good insight and judgment, understanding that her paranoia at home is still a symptom of her illness. Denies suicidality. Denies that she is aware of having hallucinations currently. The patient's short- and long-term memory appear  to be intact. Normal intelligence. Normal fund of knowledge.   LABORATORY RESULTS: Wrist x-ray was done which just showed her wrist with no bony damage and no foreign bodies still in it. Salicylates negative. Acetaminophen negative. Chemistry panel: Elevated  glucose 165, potassium of 3.3. CBC unremarkable. Urinalysis unremarkable. Drug screen negative.   ASSESSMENT: This is a 54 year old woman with bipolar disorder who recently was in the hospital for a mania. Since going home, she is still having some symptoms characteristic of hypomania. They are certainly resolving compared to what they were before. There is no sign that she is behaving in an acutely dangerous manner and she has been cooperative with treatment and maintains good insight. At this point, I do not think there is any indication for rehospitalization. I have discussed the illness and treatment with both the patient and her daughter. My recommendation is that she increase the dose of her Depakote as 500 would normally be a very low dose for someone of her size and she is not having any side effects currently. Continue the Abilify, which is probably at 20 mg a day, and increase Depakote to 1000 mg at night. I have given her a prescription. She agrees to the plan. She will follow up with Dr. Annitta Jersey and I have instructed her and her daughter to call the office, see if she can get in a week or so early if possible.   DIAGNOSIS, PRINCIPAL AND PRIMARY:  AXIS I: Bipolar disorder, hypomania.   SECONDARY DIAGNOSES: AXIS I: No further.  AXIS II: No diagnosis.  AXIS III: Obesity, diabetes.  AXIS IV: Moderate chronic stress, living with her daughter.  AXIS V: Functioning at time of evaluation 53.   ____________________________ Gonzella Lex, MD jtc:cs D: 01/14/2014 16:11:00 ET T: 01/14/2014 18:53:29 ET JOB#: [id number]  cc: Gonzella Lex, MD, <Dictator> Gonzella Lex MD ELECTRONICALLY SIGNED 01/14/2014 22:50

## 2015-01-09 NOTE — H&P (Signed)
PATIENT NAME:  Jodi [name], KRING MR#:  [id number] DATE OF BIRTH:  Oct 21, 1960  DATE OF ADMISSION:  03/04/2014  PRIMARY CARE PHYSICIAN: A. Lavone Orn, MD  REFERRING PHYSICIAN: Gretchen Short. Beather Arbour, MD   CHIEF COMPLAINT: Shortness of breath.   HISTORY OF PRESENT ILLNESS: Jodi [name] is a 54 year old morbidly obese female with a BMI of 43,  has history of hypertension, congestive heart failure, diastolic, who presented to the Emergency Department with complaints of shortness of breath which has been gradually getting worse for the last 3 to 4 days. The patient states that the patient is supposed to take Lasix 40 mg 2 times a day; however, has been taking only once a day. Noticed to have increased swelling in the lower extremities which has been gradually getting worse. The patient, at baseline, sleeps only in the recliner. Denied any paroxysmal nocturnal dyspnea. Concerning this shortness of breath, came to the Emergency Department. Initial oxygen saturations were 88%. The patient was given 1 dose of Lasix, with good diuresis in the Emergency Department. The patient denied having any chest pain, palpations. Initial set of cardiac enzymes was negative. Chest x-ray is consistent with vascular congestion.   PAST MEDICAL HISTORY:  1. Hypertension.  2. Hyperlipidemia.  3. Diabetes mellitus.  4. Morbid obesity with a BMI of 56. 5. Chronic back pain.  6. Bipolar disorder with multiple manic episodes.  7. Obesity hypoventilation syndrome, noncompliant with CPAP.   PAST SURGICAL HISTORY: Right oophorectomy.   ALLERGIES: AMOXICILLIN AND PENICILLIN.   HOME MEDICATIONS:  1. Metformin 500 mg 2 times a day.  2. Lasix 40 mg 2 times a day. 3. Aripiprazole 20 mg once a day.   SOCIAL HISTORY: Remote history of smoking. Denies drinking alcohol or using illicit drugs. Lives with her daughter. Independent of ADLs.   FAMILY HISTORY: Grandmother with history of cancer. Sister died of unknown reasons. Father with  pancreatic cancer.   REVIEW OF SYSTEMS:  CONSTITUTIONAL: Denies any generalized weakness.  EYES: No change in vision.  ENT: No change in hearing.  RESPIRATORY: Has cough, shortness of breath.  CARDIOVASCULAR: No chest pain, palpitations. Increased lower extremity swelling.  ENDOCRINE: Has diagnosis of diabetes mellitus.  HEMATOLOGY: No easy bruising or bleeding.  SKIN: No rashes or lesions.  MUSCULOSKELETAL: Has chronic pain.  NEUROLOGIC: No weakness or numbness in any part of the body.  PSYCHIATRIC: Has history of bipolar disorder.   PHYSICAL EXAMINATION:  GENERAL: This is a well-built, well-nourished, age-appropriate female lying down in the bed, not in distress.  VITAL SIGNS: Temperature 98, pulse 61, blood pressure 146/79, respiratory rate of 16, oxygen saturation is 90% on 2 liters of oxygen.  HEENT: Head normocephalic, atraumatic. Eyes: No scleral icterus. Conjunctivae normal. Pupils equal and reactive. Extraocular movements are intact. Mucous membranes moist. No pharyngeal erythema.  NECK: Supple. No lymphadenopathy. No JVD. No carotid bruit.  CHEST: Has no focal tenderness.  LUNGS: Bibasilar crackles are heard.  HEART: S1, S2 regular. Systolic murmur heard.  ABDOMEN: Bowel sounds present. Soft, nontender, nondistended. No hepatosplenomegaly.  EXTREMITIES: 2 to 3+ pitting edema extending up to the knees.  SKIN: No rash or lesions.  MUSCULOSKELETAL: No joint pains and aches.  NEUROLOGIC: The patient is alert, oriented to place, person and time. Cranial nerves II through XII intact. Motor 5/5 in upper and lower extremities.   LABORATORY DATA:  Chest x-ray shows vascular congestion, mild cardiomegaly, mildly increased interstitial markings.  CBC is completely within normal limits.  CMP is completely within normal  limits.  BNP is 163.   ASSESSMENT AND PLAN: [name] is a 54 year old female who comes to the Emergency Department with congestive heart failure.   1. Congestive  heart failure. Keep the patient on fluid restriction. Continue with the diuresis 40 mg IV b.i.d. Strict I's and O's. The patient is not on any ACE inhibitors or beta blockers. Start the patient on Coreg and lisinopril.  2. Diabetes mellitus. The patient is on metformin, and also continue sliding scale insulin.  3. Morbid obesity. Counseled with the patient regarding diet and exercise. Will need further counseling.  4. Hypertension, currently well controlled.  5. Keep the patient on deep vein thrombosis prophylaxis with Lovenox.   TIME SPENT: 50 minutes.   ____________________________ Monica Becton, MD pv:lb D: 03/04/2014 05:38:18 ET T: 03/04/2014 06:16:21 ET JOB#: [id number]  cc: Monica Becton, MD, <Dictator> A. Lavone Orn, MD Grier Mitts VASIREDDY MD ELECTRONICALLY SIGNED 03/12/2014 20:58

## 2015-01-09 NOTE — H&P (Signed)
PATIENT NAME:  Jodi [name], Jodi [name] MR#:  [id number] DATE OF BIRTH:  Oct 03, 1960  DATE OF ADMISSION:  12/16/2013  REFERRING PHYSICIAN: Emergency Room M.D.   ATTENDING PHYSICIAN: Jolanta B. Bary Leriche, M.D.   IDENTIFYING DATA: Jodi [name] is a 54 year old female with history of bipolar disorder.   CHIEF COMPLAINT: "I've been cleaning"   HISTORY OF PRESENT ILLNESS: Jodi [name] was brought to the hospital by her daughter, with whom she lives. The patient has been not sleeping, cleaning her house all the time, singing, dancing, hyperactive, not herself with disorganized thoughts. The patient has a history of bipolar illness. She was hospitalized several times before, always for manic episodes. Following her last hospitalization in 2008 at [facility], she was on Depakote and Abilify but she was not compliant with medications. She first stopped Depakote and then Abilify. She has been off medications for a period of time. She is not a good historian and I do not believe that she can be fully trusted, but much of the information was obtained from her daughter, [name].  The patient reports that she agreed to come to the hospital this time around while in the past she would always be taken to the hospital by police when her symptoms were really much worse. She started on Abilify already and I see much progress. She is not displaying any unusual behavior. No singing, no dancing. She went to group this morning. She is still disorganized and tangential, but no overly psychotic symptoms are present. The patient denies ever having symptoms of depression. She denies auditory hallucinations but does admit to paranoid delusions, feeling of power and might, communicating with God and being a special messenger, appropriately for Easter. She denies ever using alcohol or substances.   PAST PSYCHIATRIC HISTORY: She was hospitalized at [facility] in 2006 and [facility] in [facility]. She says that in all she  could have 3 or 4 hospitalizations. She has been tried on multiple medications including Seroquel, Risperdal, Depakote, lithium. She feels that Abilify works best for her. She is morbidly obese and weight gain is always an issue. She reports that lately she has been losing weight. She developed a heart condition for which she is prescribed low cholesterol, low sodium diet. She later developed diabetes for which she is prescribed a low-carbohydrate. She adheres to her diet religiously, helping her weight loss. She denies ever attempting suicide.   FAMILY PSYCHIATRIC HISTORY: Reportedly 2 sisters with bipolar.   PAST MEDICAL HISTORY: Morbid obesity, hypertension, dyslipidemia, diabetes, history of coronary artery disease.   ALLERGIES: AMOXICILLIN AND PENICILLIN.   MEDICATIONS ON ADMISSION: Aspirin 81 mg daily, metformin 500 mg twice daily, Depakote ER 500 mg at bedtime, Abilify 10 mg at bedtime, furosemide 40 mg 3 times daily, amoxicillin 500 mg 3 times daily for 10 days and Benadryl. I doubt that they are her regular medications. This probably is an incomplete or old list.   SOCIAL: She is disabled. She used to work in [employer] (medical) and now she lives with her daughter, [name].    REVIEW OF SYSTEMS: CONSTITUTIONAL: No fevers or chills. Positive for gradual weight loss.  EYES: No double or blurred vision.  ENT: No hearing loss.  RESPIRATORY: No shortness of breath or cough.  CARDIOVASCULAR: No chest pain or orthopnea.  GASTROINTESTINAL: No abdominal pain, nausea, vomiting or diarrhea.  GENITOURINARY: Positive for incontinence.  ENDOCRINE: No heat or cold intolerance.  LYMPHATIC: No anemia or easy bruising.  INTEGUMENTARY: No acne or rash.  MUSCULOSKELETAL: No muscle or joint pain.  NEUROLOGIC: No tingling or weakness.  PSYCHIATRIC: See history of present illness for details.   PHYSICAL EXAMINATION: VITAL SIGNS: Blood pressure 123/74, pulse 71, respirations 20, temperature 97.9.  GENERAL: This is  a morbidly obese female walking with a walker.  HEENT: The pupils are equal, round and reactive to light.  NECK: Supple. No thyromegaly.  LUNGS: Clear to auscultation. No dullness to percussion.  HEART: Regular rhythm and rate. No murmurs, rubs or gallops.  ABDOMEN: Soft, nontender, nondistended. Positive bowel sounds.  MUSCULOSKELETAL: Normal muscle strength in all extremities.  SKIN: No rashes or bruises.  LYMPHATIC: No cervical adenopathy.  NEUROLOGIC: Cranial nerves II through XII are intact.   LABORATORY DATA: Chemistries are within normal limits except for blood glucose of 128, potassium of 3.4. Blood alcohol level is zero. LFTs within normal limits. TSH 0.933. Urine tox screen negative for substances. CBC within normal limits except for white blood count of 11.3. Urinalysis is not suggestive of urinary tract infection. Serum acetaminophen and salicylate are low.   MENTAL STATUS EXAMINATION ON ADMISSION: The patient is alert and oriented to person, place, time and situation. She is pleasant, polite and cooperative. She is marginally groomed, wearing private clothes. She maintains good eye contact. Her speech is of normal rhythm, rate and volume. Mood is "very good" with labile affect. Thought process is disorganized. There are racing thoughts and tangentiality. She denies thoughts of hurting herself or others. She is paranoid and delusional with delusions of grandeur. She denies auditory or visual hallucinations but has a way to communicate with God. Her cognition is grossly intact. She registers 3 out of 3 and recalls 1 out of 3 objects after 5 minutes. She can spell "cat" forwards and backwards. She is not a very good historian. She seems to be of normal intelligence and average fund of knowledge. Her insight and judgment seem fair.   SUICIDE RISK ASSESSMENT: This is a patient with history of bipolar illness, mostly manic episodes, who came to the hospital in a manic psychotic episode in the  context of medication noncompliance.   DIAGNOSES: AXIS I: Bipolar 1 disorder, mania with psychosis.  AXIS IV: Deferred.  AXIS III: Dyslipidemia, hypertension, coronary artery disease, diabetes and morbid obesity.  AXIS II: Mental illness, treatment compliance.  AXIS V: Global assessment of functioning 35.   PLAN: The patient was admitted to [facility] for safety, stabilization and medication management. She was initially placed on suicide precautions and was closely monitored for any unsafe behaviors. She underwent full psychiatric and risk assessment. She received pharmacotherapy, individual and group psychotherapy, substance abuse counseling and support from therapeutic milieu.  1.  Mood and psychosis. The patient was started on Abilify and low-dose Depakote. Given her weight, I do not believe that Depakote is a good choice. She has been improving very rapidly and was noncompliant with Abilify. Will just keep her on Abilify for now and wait and see.  Tegretol would be more appropriate or maybe switching to Geodon if necessary.  2.  Medical. We will continue all medications as in the community with glucose monitoring.  3.  Morbid obesity. Dietary consult.  4.  The patient is using a walker on the unit, at home a cane.  I am going to ask PT to evaluate.  5.  Disposition. She will be discharged to home with her daughter.   ____________________________ Wardell Honour. Bary Leriche, MD jbp:[id number] D: 12/17/2013 16:29:38 ET T:  12/17/2013 18:51:51 ET JOB#: [id number]  cc: Jolanta B. Bary Leriche, MD, <Dictator> Clovis Fredrickson MD ELECTRONICALLY SIGNED 12/20/2013 5:32

## 2015-01-09 NOTE — Discharge Summary (Signed)
PATIENT NAME:  Jodi [name], Jodi [name] MR#:  [id number] DATE OF BIRTH:  1960-10-20  DATE OF ADMISSION:  03/04/2014 DATE OF DISCHARGE:  03/05/2014  FINAL DIAGNOSES:  1. Acute diastolic congestive heart failure.  2. Bipolar.  3. Obesity.  4. Diabetes.   MEDICATIONS ON DISCHARGE: Include metformin 500 mg twice a day, Lasix 40 mg twice a day, Depakote ER 500 mg extended-release 2 tablets at bedtime, Seroquel decreased to 150 mg extended-release at bedtime, metoprolol tartrate 25 mg extended-release once a day.   DIET: Low sodium diet, carbohydrate -controlled diet, regular consistency.   ACTIVITY: As tolerated.   FOLLOWUP:  With the CHF clinic. Follow up in 1-2 weeks at [facility]. I do recommend an outpatient sleep study.   HOSPITAL COURSE: The patient was admitted 03/04/2014, discharged 03/05/2014. Came in with shortness of breath. The patient stopped taking her Lasix for a few days. Was admitted with congestive heart failure and started on Lasix 40 mg IV b.i.d.   LABORATORY AND RADIOLOGICAL DATA DURING THE HOSPITAL COURSE: Included troponin that was negative x 3. White blood cell count 8.8, H and H 12.2 and 39.4, platelet count 173,000. Glucose 90, BUN 22, creatinine 0.73, sodium 144, potassium 3.7, chloride 103, CO2 of 36, calcium 8.1. Liver function tests normal range. Albumin low at 2.9. BNP elevated at 163.  EKG: Sinus bradycardia. Chest x-ray: Vascular congestion, mild cardiomegaly, increased interstitial markings raising concern for mild interstitial edema echocardiogram showed an EF of 55% to 60%, normal global left ventricular systolic function, moderate left ventricular hypertrophy, mildly elevated pulmonary arterial systolic pressure. Creatinine upon discharge 0.60, potassium 3.7.   HOSPITAL COURSE PER PROBLEM LIST:  1. For the patient's acute diastolic congestive heart failure, we did give Lasix 40 mg IV b.i.d. While here, I switched over to her usual 40 mg twice a day.  I advised  her to take this medication. I had to convince her to take a beta blocker, Toprol-XL, once a day. No need for ACE inhibitors, since the patient's ejection fraction is normal. Follow up in the congestive heart failure clinic. I do recommend an outpatient sleep study to rule out sleep apnea.  2. Bipolar disorder. The patient is very sleepy with the increased dose of Seroquel. I cut her Seroquel  in her half, 150 mg extended-release daily. I wrote her a script for 30 pills. The patient states that she does not take the Abilify anymore and she takes Depakote.  3. Obesity, BMI is 57.7. Weight loss as needed. Recommend screening for sleep apnea with sleep study.  4. Diabetes. The patient is on metformin.  TIME SPENT ON DISCHARGE: 35 minutes.    ____________________________ Tana Conch. Leslye Peer, MD rjw:dd D: 03/05/2014 15:13:02 ET T: 03/05/2014 21:13:40 ET JOB#: [id number]  cc: Tana Conch. Leslye Peer, MD, <Dictator> Marisue Brooklyn MD ELECTRONICALLY SIGNED 03/06/2014 16:18

## 2015-01-09 NOTE — Consult Note (Signed)
Brief Consult Note: Diagnosis: bipolar disorder.   Patient was seen by consultant.   Consult note dictated.   Recommend further assessment or treatment.   Orders entered.   Discussed with Attending MD.   Comments: Psychiatry: Patient seen and note dictated. Patient with bipolar disorder and recent hospitalization still having some hypomanic behavior and paranoia at home but no sign of acute dangerousness. Reviewed plan and suggest increased depakote and follow up with outpt psychiatry. Patient agreeable. Script done.Discussed with ER doctor.  Electronic Signatures: Clapacs, Madie Reno (MD)  (Signed 29-Apr-15 16:03)  Authored: Brief Consult Note   Last Updated: 29-Apr-15 16:03 by Gonzella Lex (MD)

## 2015-01-21 ENCOUNTER — Other Ambulatory Visit: Payer: Self-pay | Admitting: Family Medicine

## 2015-01-21 DIAGNOSIS — Z1231 Encounter for screening mammogram for malignant neoplasm of breast: Secondary | ICD-10-CM

## 2015-02-27 ENCOUNTER — Emergency Department (HOSPITAL_COMMUNITY): Payer: Medicare Other

## 2015-02-27 ENCOUNTER — Emergency Department (HOSPITAL_COMMUNITY)
Admission: EM | Admit: 2015-02-27 | Discharge: 2015-02-27 | Disposition: A | Discharge status: Home / Self Care | Payer: Medicare Other | Attending: Emergency Medicine | Admitting: Emergency Medicine

## 2015-02-27 ENCOUNTER — Encounter (HOSPITAL_COMMUNITY): Payer: Self-pay | Admitting: Emergency Medicine

## 2015-02-27 DIAGNOSIS — E119 Type 2 diabetes mellitus without complications: Secondary | ICD-10-CM | POA: Insufficient documentation

## 2015-02-27 DIAGNOSIS — Z880 Allergy status to penicillin: Secondary | ICD-10-CM | POA: Insufficient documentation

## 2015-02-27 DIAGNOSIS — D849 Immunodeficiency, unspecified: Secondary | ICD-10-CM | POA: Diagnosis not present

## 2015-02-27 DIAGNOSIS — R0602 Shortness of breath: Secondary | ICD-10-CM

## 2015-02-27 DIAGNOSIS — Z87891 Personal history of nicotine dependence: Secondary | ICD-10-CM | POA: Insufficient documentation

## 2015-02-27 DIAGNOSIS — R600 Localized edema: Secondary | ICD-10-CM | POA: Diagnosis not present

## 2015-02-27 DIAGNOSIS — R609 Edema, unspecified: Secondary | ICD-10-CM

## 2015-02-27 DIAGNOSIS — F319 Bipolar disorder, unspecified: Secondary | ICD-10-CM | POA: Diagnosis not present

## 2015-02-27 DIAGNOSIS — I509 Heart failure, unspecified: Secondary | ICD-10-CM | POA: Insufficient documentation

## 2015-02-27 DIAGNOSIS — M25561 Pain in right knee: Secondary | ICD-10-CM | POA: Insufficient documentation

## 2015-02-27 DIAGNOSIS — G8929 Other chronic pain: Secondary | ICD-10-CM | POA: Insufficient documentation

## 2015-02-27 DIAGNOSIS — R05 Cough: Secondary | ICD-10-CM | POA: Insufficient documentation

## 2015-02-27 DIAGNOSIS — M25562 Pain in left knee: Secondary | ICD-10-CM | POA: Insufficient documentation

## 2015-02-27 LAB — CBC
HEMATOCRIT: 46.3 % — AB (ref 36.0–46.0)
HEMOGLOBIN: 14.5 g/dL (ref 12.0–15.0)
MCH: 29.2 pg (ref 26.0–34.0)
MCHC: 31.3 g/dL (ref 30.0–36.0)
MCV: 93.2 fL (ref 78.0–100.0)
PLATELETS: 174 10*3/uL (ref 150–400)
RBC: 4.97 MIL/uL (ref 3.87–5.11)
RDW: 14.5 % (ref 11.5–15.5)
WBC: 7.1 10*3/uL (ref 4.0–10.5)

## 2015-02-27 LAB — COMPREHENSIVE METABOLIC PANEL
ALK PHOS: 63 U/L (ref 38–126)
ALT: 18 U/L (ref 14–54)
ANION GAP: 11 (ref 5–15)
AST: 16 U/L (ref 15–41)
Albumin: 3.4 g/dL — ABNORMAL LOW (ref 3.5–5.0)
BUN: 18 mg/dL (ref 6–20)
CHLORIDE: 101 mmol/L (ref 101–111)
CO2: 28 mmol/L (ref 22–32)
Calcium: 9 mg/dL (ref 8.9–10.3)
Creatinine, Ser: 0.62 mg/dL (ref 0.44–1.00)
GFR calc non Af Amer: >60 mL/min (ref >60)
GLUCOSE: 129 mg/dL — AB (ref 65–99)
Potassium: 3.7 mmol/L (ref 3.5–5.1)
Sodium: 140 mmol/L (ref 135–145)
Total Bilirubin: 0.4 mg/dL (ref 0.3–1.2)
Total Protein: 7.1 g/dL (ref 6.5–8.1)

## 2015-02-27 LAB — TROPONIN I

## 2015-02-27 LAB — BRAIN NATRIURETIC PEPTIDE: B NATRIURETIC PEPTIDE 5: 23.2 pg/mL (ref 0.0–100.0)

## 2015-02-27 MED ORDER — MORPHINE SULFATE 4 MG/ML IJ SOLN
4.0000 mg | Freq: Once | INTRAMUSCULAR | Status: DC
Start: 2015-02-27 — End: 2015-02-27

## 2015-02-27 MED ORDER — HYDROCODONE-ACETAMINOPHEN 5-325 MG PO TABS
1.0000 | ORAL_TABLET | Freq: Once | ORAL | Status: DC
  Filled 2015-02-27: qty 1

## 2015-02-27 NOTE — ED Provider Notes (Signed)
MSE was initiated and I personally evaluated the patient and placed orders (if any) at  6:02 AM on February 26, 4178.   54 year old female with a history of morbid obesity, diabetes mellitus, and CHF presents to the emergency department for further evaluation of lower extremity swelling and shortness of breath. Patient states that she was sitting on the couch when she felt as though her left leg felt funny. She reports looking down at her leg and noticing that it seemed swollen to her. She states that she may have had some mild swelling in her right ankle as well. Shortly after noticing this swelling, the patient reports she began to feel short of breath. She denies experiencing any chest pain. No syncope, nausea, or vomiting associated with her symptoms. Patient denies a history of blood clots. She reports trying to take a tablet of 40 mg Lasix to relieve her symptoms, with no effect. She states that she has had similar symptoms in the past associated with acute CHF exacerbation.  PCP - Precious Haws   The patient appears stable so that the remainder of the MSE may be completed by another provider.  Antonietta Breach, PA-C 02/27/15 [id number]  Julianne Rice, MD 02/27/15 [phone]

## 2015-02-27 NOTE — Discharge Instructions (Signed)
Elevate your legs and use heat or ice to help with pain and swelling. Use tylenol and motrin as needed for pain. Continue your usual home medications. Follow up with your regular doctor in 5-7 days for recheck of symptoms and ongoing management of your chronic knee pain. Return to the ER for changes or worsening symptoms.   Edema Edema is an abnormal buildup of fluids. It is more common in your legs and thighs. Painless swelling of the feet and ankles is more likely as a person ages. It also is common in looser skin, like around your eyes. HOME CARE   Keep the affected body part above the level of the heart while lying down.  Do not sit still or stand for a long time.  Do not put anything right under your knees when you lie down.  Do not wear tight clothes on your upper legs.  Exercise your legs to help the puffiness (swelling) go down.  Wear elastic bandages or support stockings as told by your doctor.  A low-salt diet may help lessen the puffiness.  Only take medicine as told by your doctor. GET HELP IF:  Treatment is not working.  You have heart, liver, or kidney disease and notice that your skin looks puffy or shiny.  You have puffiness in your legs that does not get better when you raise your legs.  You have sudden weight gain for no reason. GET HELP RIGHT AWAY IF:   You have shortness of breath or chest pain.  You cannot breathe when you lie down.  You have pain, redness, or warmth in the areas that are puffy.  You have heart, liver, or kidney disease and get edema all of a sudden.  You have a fever and your symptoms get worse all of a sudden. MAKE SURE YOU:   Understand these instructions.  Will watch your condition.  Will get help right away if you are not doing well or get worse. Document Released: 02/21/2008 Document Revised: 09/09/2013 Document Reviewed: 06/27/2013 [facility] Patient Information 2015 [place], [place]. This information is not intended to  replace advice given to you by your health care provider. Make sure you discuss any questions you have with your health care provider.  Knee Pain Knee pain can be a result of an injury or other medical conditions. Treatment will depend on the cause of your pain. HOME CARE  Only take medicine as told by your doctor.  Keep a healthy weight. Being overweight can make the knee hurt more.  Stretch before exercising or playing sports.  If there is constant knee pain, change the way you exercise. Ask your doctor for advice.  Make sure shoes fit well. Choose the right shoe for the sport or activity.  Protect your knees. Wear kneepads if needed.  Rest when you are tired. GET HELP RIGHT AWAY IF:   Your knee pain does not stop.  Your knee pain does not get better.  Your knee joint feels hot to the touch.  You have a fever. MAKE SURE YOU:   Understand these instructions.  Will watch this condition.  Will get help right away if you are not doing well or get worse. Document Released: 12/01/2008 Document Revised: 11/27/2011 Document Reviewed: 12/01/2008 [facility] Patient Information 2015 [place], [place]. This information is not intended to replace advice given to you by your health care provider. Make sure you discuss any questions you have with your health care provider.  Heat Therapy Heat therapy can help ease  sore, stiff, injured, and tight muscles and joints. Heat relaxes your muscles, which may help ease your pain.  RISKS AND COMPLICATIONS If you have any of the following conditions, do not use heat therapy unless your health care provider has approved:  Poor circulation.  Healing wounds or scarred skin in the area being treated.  Diabetes, heart disease, or high blood pressure.  Not being able to feel (numbness) the area being treated.  Unusual swelling of the area being treated.  Active infections.  Blood clots.  Cancer.  Inability to communicate pain. This may  include young children and people who have problems with their brain function (dementia).  Pregnancy. Heat therapy should only be used on old, pre-existing, or long-lasting (chronic) injuries. Do not use heat therapy on new injuries unless directed by your health care provider. HOW TO USE HEAT THERAPY There are several different kinds of heat therapy, including:  Moist heat pack.  Warm water bath.  Hot water bottle.  Electric heating pad.  Heated gel pack.  Heated wrap.  Electric heating pad. Use the heat therapy method suggested by your health care provider. Follow your health care provider's instructions on when and how to use heat therapy. GENERAL HEAT THERAPY RECOMMENDATIONS  Do not sleep while using heat therapy. Only use heat therapy while you are awake.  Your skin may turn pink while using heat therapy. Do not use heat therapy if your skin turns red.  Do not use heat therapy if you have new pain.  High heat or long exposure to heat can cause burns. Be careful when using heat therapy to avoid burning your skin.  Do not use heat therapy on areas of your skin that are already irritated, such as with a rash or sunburn. SEEK MEDICAL CARE IF:  You have blisters, redness, swelling, or numbness.  You have new pain.  Your pain is worse. MAKE SURE YOU:  Understand these instructions.  Will watch your condition.  Will get help right away if you are not doing well or get worse. Document Released: 11/27/2011 Document Revised: 01/19/2014 Document Reviewed: 10/28/2013 [facility] Patient Information 2015 [place], [place]. This information is not intended to replace advice given to you by your health care provider. Make sure you discuss any questions you have with your health care provider.  Cryotherapy Cryotherapy means treatment with cold. Ice or gel packs can be used to reduce both pain and swelling. Ice is the most helpful within the first 24 to 48 hours after an injury or  flare-up from overusing a muscle or joint. Sprains, strains, spasms, burning pain, shooting pain, and aches can all be eased with ice. Ice can also be used when recovering from surgery. Ice is effective, has very few side effects, and is safe for most people to use. PRECAUTIONS  Ice is not a safe treatment option for people with:  Raynaud phenomenon. This is a condition affecting small blood vessels in the extremities. Exposure to cold may cause your problems to return.  Cold hypersensitivity. There are many forms of cold hypersensitivity, including:  Cold urticaria. Red, itchy hives appear on the skin when the tissues begin to warm after being iced.  Cold erythema. This is a red, itchy rash caused by exposure to cold.  Cold hemoglobinuria. Red blood cells break down when the tissues begin to warm after being iced. The hemoglobin that carry oxygen are passed into the urine because they cannot combine with blood proteins fast enough.  Numbness or altered sensitivity  in the area being iced. If you have any of the following conditions, do not use ice until you have discussed cryotherapy with your caregiver:  Heart conditions, such as arrhythmia, angina, or chronic heart disease.  High blood pressure.  Healing wounds or open skin in the area being iced.  Current infections.  Rheumatoid arthritis.  Poor circulation.  Diabetes. Ice slows the blood flow in the region it is applied. This is beneficial when trying to stop inflamed tissues from spreading irritating chemicals to surrounding tissues. However, if you expose your skin to cold temperatures for too long or without the proper protection, you can damage your skin or nerves. Watch for signs of skin damage due to cold. HOME CARE INSTRUCTIONS Follow these tips to use ice and cold packs safely.  Place a dry or damp towel between the ice and skin. A damp towel will cool the skin more quickly, so you may need to shorten the time that the  ice is used.  For a more rapid response, add gentle compression to the ice.  Ice for no more than 10 to 20 minutes at a time. The bonier the area you are icing, the less time it will take to get the benefits of ice.  Check your skin after 5 minutes to make sure there are no signs of a poor response to cold or skin damage.  Rest 20 minutes or more between uses.  Once your skin is numb, you can end your treatment. You can test numbness by very lightly touching your skin. The touch should be so light that you do not see the skin dimple from the pressure of your fingertip. When using ice, most people will feel these normal sensations in this order: cold, burning, aching, and numbness.  Do not use ice on someone who cannot communicate their responses to pain, such as small children or people with dementia. HOW TO MAKE AN ICE PACK Ice packs are the most common way to use ice therapy. Other methods include ice massage, ice baths, and cryosprays. Muscle creams that cause a cold, tingly feeling do not offer the same benefits that ice offers and should not be used as a substitute unless recommended by your caregiver. To make an ice pack, do one of the following:  Place crushed ice or a bag of frozen vegetables in a sealable plastic bag. Squeeze out the excess air. Place this bag inside another plastic bag. Slide the bag into a pillowcase or place a damp towel between your skin and the bag.  Mix 3 parts water with 1 part rubbing alcohol. Freeze the mixture in a sealable plastic bag. When you remove the mixture from the freezer, it will be slushy. Squeeze out the excess air. Place this bag inside another plastic bag. Slide the bag into a pillowcase or place a damp towel between your skin and the bag. SEEK MEDICAL CARE IF:  You develop white spots on your skin. This may give the skin a blotchy (mottled) appearance.  Your skin turns blue or pale.  Your skin becomes waxy or hard.  Your swelling gets  worse. MAKE SURE YOU:   Understand these instructions.  Will watch your condition.  Will get help right away if you are not doing well or get worse. Document Released: 05/01/2011 Document Revised: 01/19/2014 Document Reviewed: 05/01/2011 [facility] Patient Information 2015 [place], [place]. This information is not intended to replace advice given to you by your health care provider. Make sure you discuss any  questions you have with your health care provider.  Shortness of Breath Shortness of breath means you have trouble breathing. Shortness of breath needs medical care right away. HOME CARE   Do not smoke.  Avoid being around chemicals or things (paint fumes, dust) that may bother your breathing.  Rest as needed. Slowly begin your normal activities.  Only take medicines as told by your doctor.  Keep all doctor visits as told. GET HELP RIGHT AWAY IF:   Your shortness of breath gets worse.  You feel lightheaded, pass out (faint), or have a cough that is not helped by medicine.  You cough up blood.  You have pain with breathing.  You have pain in your chest, arms, shoulders, or belly (abdomen).  You have a fever.  You cannot walk up stairs or exercise the way you normally do.  You do not get better in the time expected.  You have a hard time doing normal activities even with rest.  You have problems with your medicines.  You have any new symptoms. MAKE SURE YOU:  Understand these instructions.  Will watch your condition.  Will get help right away if you are not doing well or get worse. Document Released: 02/21/2008 Document Revised: 09/09/2013 Document Reviewed: 11/20/2011 [facility] Patient Information 2015 [place], [place]. This information is not intended to replace advice given to you by your health care provider. Make sure you discuss any questions you have with your health care provider.

## 2015-02-27 NOTE — ED Provider Notes (Signed)
CSN: [id number]     Arrival date & time 02/27/15  5400 History   First MD Initiated Contact with Patient 02/27/15 [phone]     Chief Complaint  Patient presents with  . Shortness of Breath  . Leg Pain  . Leg Swelling     (Consider location/radiation/quality/duration/timing/severity/associated sxs/prior Treatment) HPI Comments: Jodi [name] is a 54 y.o. female with a PMHx of morbid obesity, CHF, DM2, depression, chronic back and knee pain, and bipolar disorder, who presents to the ED with complaints of lower extremity swelling and SOB. Patient states that she was sitting on the couch when she felt L leg felt funny but without pain. She reports she thought it looked swollen. She states that she may have had some mild swelling in her right ankle as well. Shortly after this, she states she began to feel short of breath. She states that she has had similar symptoms in the past associated with acute CHF exacerbation. She tried taking lasix 40mg  around the time of onset at ~3:30am, and at the time of my exam she states that this has helped her shortness of breath. She states that now she is having some right knee pain, 3/10 aching constant nonradiating with no known aggravating factors, and unrelieved with Aleve. Additional symptoms include a dry nonproductive cough. She denies any fevers, chills, chest pain, wheezing, hemoptysis, recent travel/surgery/immobilization, as she continues, history of DVT/PE, leg warmth or erythema, abdominal pain, nausea, vomiting, diarrhea, constipation, dysuria, hematuria, vaginal bleeding or discharge, numbness, tingling, weakness, orthopnea, diaphoresis, lightheadedness, smoking history, or family history of cardiac disease or DVT/PE. Overall she feels better at this time.   Patient is a 54 y.o. female presenting with shortness of breath and leg pain. The history is provided by the patient. No language interpreter was used.  Shortness of Breath Severity:  Mild Onset  quality:  Gradual Duration:  4 hours Timing:  Constant Progression:  Improving Chronicity:  Recurrent Relieved by:  Diuretics Worsened by:  Nothing tried Ineffective treatments:  None tried Associated symptoms: cough (dry)   Associated symptoms: no abdominal pain, no chest pain, no claudication, no diaphoresis, no fever, no hemoptysis, no PND, no sputum production, no vomiting and no wheezing   Risk factors: obesity   Risk factors: no family hx of DVT, no hx of PE/DVT, no oral contraceptive use, no prolonged immobilization and no recent surgery   Leg Pain Associated symptoms: no fever     Past Medical History  Diagnosis Date  . Bipolar disorder   . Chronic back pain   . Chronic knee pain   . Morbid obesity   . Obesity hypoventilation syndrome   . Depression   . Diabetes mellitus     Type II  . CHF (congestive heart failure)    Past Surgical History  Procedure Laterality Date  . Right oophorectomy     No family history on file. History  Substance Use Topics  . Smoking status: Former Research scientist (life sciences)  . Smokeless tobacco: Not on file  . Alcohol Use: Yes   OB History    No data available     Review of Systems  Constitutional: Negative for fever, chills and diaphoresis.  Respiratory: Positive for cough (dry) and shortness of breath. Negative for hemoptysis, sputum production and wheezing.   Cardiovascular: Positive for leg swelling (subjective RLE). Negative for chest pain, claudication and PND.  Gastrointestinal: Negative for nausea, vomiting, abdominal pain, diarrhea and constipation.  Genitourinary: Negative for dysuria, hematuria, vaginal bleeding and vaginal  discharge.  Musculoskeletal: Positive for arthralgias (R knee pain). Negative for myalgias.  Skin: Negative for color change.  Allergic/Immunologic: Positive for immunocompromised state (diabetic).  Neurological: Negative for dizziness, weakness, light-headedness and numbness.  Psychiatric/Behavioral: Negative for  confusion.   10 Systems reviewed and are negative for acute change except as noted in the HPI.    Allergies  Penicillins  Home Medications   Prior to Admission medications   Medication Sig Start Date End Date Taking? Authorizing Provider  acetaminophen (TYLENOL) 500 MG tablet Take 500 mg by mouth every 6 (six) hours as needed for mild pain.   Yes Historical Provider, MD  divalproex (DEPAKOTE) 500 MG DR tablet Take 500 mg by mouth at bedtime.   Yes Historical Provider, MD  furosemide (LASIX) 40 MG tablet Take 40 mg by mouth 2 (two) times daily.   Yes Historical Provider, MD  metFORMIN (GLUCOPHAGE) 500 MG tablet Take 500 mg by mouth 2 (two) times daily with a meal.   Yes Historical Provider, MD  naproxen sodium (ANAPROX) 220 MG tablet Take 220 mg by mouth 2 (two) times daily as needed (pain).    Yes Historical Provider, MD  ARIPiprazole (ABILIFY) 10 MG tablet Take 10 mg by mouth daily.    Historical Provider, MD  traMADol (ULTRAM) 50 MG tablet Take 1 tablet (50 mg total) by mouth every 6 (six) hours as needed. Patient not taking: Reported on 02/27/2015 11/14/14   April Palumbo, MD   BP 119/60 mmHg  Pulse 72  Temp(Src) 98 F (36.7 C) (Oral)  Resp 18  Ht 5\' 4"  (1.626 m)  Wt 341 lb 4.8 oz (154.813 kg)  BMI 58.56 kg/m2  SpO2 95%   Physical Exam  Constitutional: She is oriented to person, place, and time. Vital signs are normal. She appears well-developed and well-nourished.  Non-toxic appearance. No distress.  Afebrile, nontoxic, NAD. Morbidly obese  HENT:  Head: Normocephalic and atraumatic.  Mouth/Throat: Oropharynx is clear and moist and mucous membranes are normal.  Eyes: Conjunctivae and EOM are normal. Right eye exhibits no discharge. Left eye exhibits no discharge.  Neck: Normal range of motion. Neck supple. No JVD present.  No JVD  Cardiovascular: Normal rate, regular rhythm, normal heart sounds and intact distal pulses.  Exam reveals no gallop and no friction rub.   No murmur  heard. RRR, nl s1/s2, no m/r/g, distal pulses intact, no pedal edema   Pulmonary/Chest: Effort normal and breath sounds normal. No respiratory distress. She has no decreased breath sounds. She has no wheezes. She has no rhonchi. She has no rales.  CTAB in all lung fields, no w/r/r, no hypoxia or increased WOB, speaking in full sentences, SpO2 95% on RA   Abdominal: Soft. Normal appearance and bowel sounds are normal. She exhibits no distension. There is no tenderness. There is no rigidity and no guarding.  Musculoskeletal: Normal range of motion.       Right knee: She exhibits normal range of motion, no swelling, no effusion, no erythema, normal alignment, no LCL laxity, normal patellar mobility, no bony tenderness and no MCL laxity. Tenderness found. Medial joint line and lateral joint line tenderness noted.       Legs: R knee with FROM intact, mild medial and lateral joint line TTP, no swelling/effusion/deformity, no bruising or erythema, no warmth, no abnormal alignment or patellar mobility, no varus/valgus laxity, neg anterior drawer test, no crepitus. No baker's cyst palpable. MAE x4 Strength and sensation grossly intact Distal pulses intact No pedal edema,  neg homan's bilaterally   Neurological: She is alert and oriented to person, place, and time. She has normal strength. No sensory deficit.  Skin: Skin is warm, dry and intact. No rash noted.  Psychiatric: She has a normal mood and affect.  Nursing note and vitals reviewed.   ED Course  Procedures (including critical care time) Labs Review Labs Reviewed  CBC - Abnormal; Notable for the following:    HCT 46.3 (*)    All other components within normal limits  COMPREHENSIVE METABOLIC PANEL - Abnormal; Notable for the following:    Glucose, Bld 129 (*)    Albumin 3.4 (*)    All other components within normal limits  BRAIN NATRIURETIC PEPTIDE  TROPONIN I    Imaging Review Dg Chest 2 View  02/27/2015   CLINICAL DATA:  Dyspnea  tonight.  EXAM: CHEST  2 VIEW  COMPARISON:  None.  FINDINGS: There is mild cardiomegaly. There is minimal linear scarring or atelectasis in the lateral left lung. There is no confluent airspace consolidation. There is no effusion. The pulmonary vasculature is normal.  IMPRESSION: Mild cardiomegaly.   Electronically Signed   By: Andreas Newport M.D.   On: 02/27/2015 06:14     EKG Interpretation   Date/Time:  Saturday February 27 2015 05:30:41 EDT Ventricular Rate:  63 PR Interval:  196 QRS Duration: 88 QT Interval:  426 QTC Calculation: 436 R Axis:   82 Text Interpretation:  Age not entered, assumed to be  54 years old for  purpose of ECG interpretation Sinus rhythm Low voltage, precordial leads  Borderline T abnormalities, anterior leads Confirmed by YELVERTON  MD,  DAVID ([id number]) on 02/27/2015 7:22:33 AM      MDM   Final diagnoses:  SOB (shortness of breath)  Peripheral edema  Knee pain, bilateral  Morbid obesity    54 y.o. female here with mild SOB and subjective RLE swelling. On exam, no pedal edema and neg homan's. Pain seems to be mostly in her knee, which is chronic. No CP complaint, no hypoxia or increased WOB, no tachycardia, doubt PE. States she took Lasix and since then she seems to feel better here. EKG nonischemic. CXR with mild cardiomegaly without pulm edema. Awaiting labs. Since pt is feeling improved, will hold off on any further lasix. Will give pain meds then reassess.  9:22 AM Pt didn't want pain meds, states her knees are already feeling better and SOB is gone. No interventions done here. Labs unremarkable, trop neg, BNP WNL, CBC and CMP WNL. Will have her use tylenol/motrin, heat/ice, and elevation to help with her leg pain/swelling (although no swelling noted on exam here). Will have her f/up with PCP in 1wk for recheck of symptoms and ongoing management. I explained the diagnosis and have given explicit precautions to return to the ER including for any other new or  worsening symptoms. The patient understands and accepts the medical plan as it's been dictated and I have answered their questions. Discharge instructions concerning home care and prescriptions have been given. The patient is STABLE and is discharged to home in good condition.   BP 119/60 mmHg  Pulse 72  Temp(Src) 98 F (36.7 C) (Oral)  Resp 18  Ht 5\' 4"  (1.626 m)  Wt 341 lb 4.8 oz (154.813 kg)  BMI 58.56 kg/m2  SpO2 95%        Treyvone Chelf Camprubi-Soms, PA-C 02/27/15 0175  Julianne Rice, MD 02/28/15 [phone]

## 2015-02-27 NOTE — ED Notes (Signed)
"  im not in that much pain, that's too strong"

## 2015-02-27 NOTE — ED Notes (Signed)
Pt brought to ED by [facility] for 5/10 leg pain and edema, pt states she is taking her lasix at home with not relief, pt is having some SOB and anxiety at this time. CBG 115, BP 146/88. R 18, HR 64, 97% on RA. Family member at the bedside. NAD noticed

## 2015-06-13 IMAGING — CR LEFT WRIST - COMPLETE 3+ VIEW
1 series · 4 of 4 positions shown · non-contrast
Comparison: None

CLINICAL DATA: Fell down stairs with a glass in hand, glass broke,
laceration at wrist anteriorly, question foreign body

EXAM:
LEFT WRIST - COMPLETE 3+ VIEW

[Series 1: pa · 0.17mm/px · 4 of 4 slices shown]
[im 1/4]
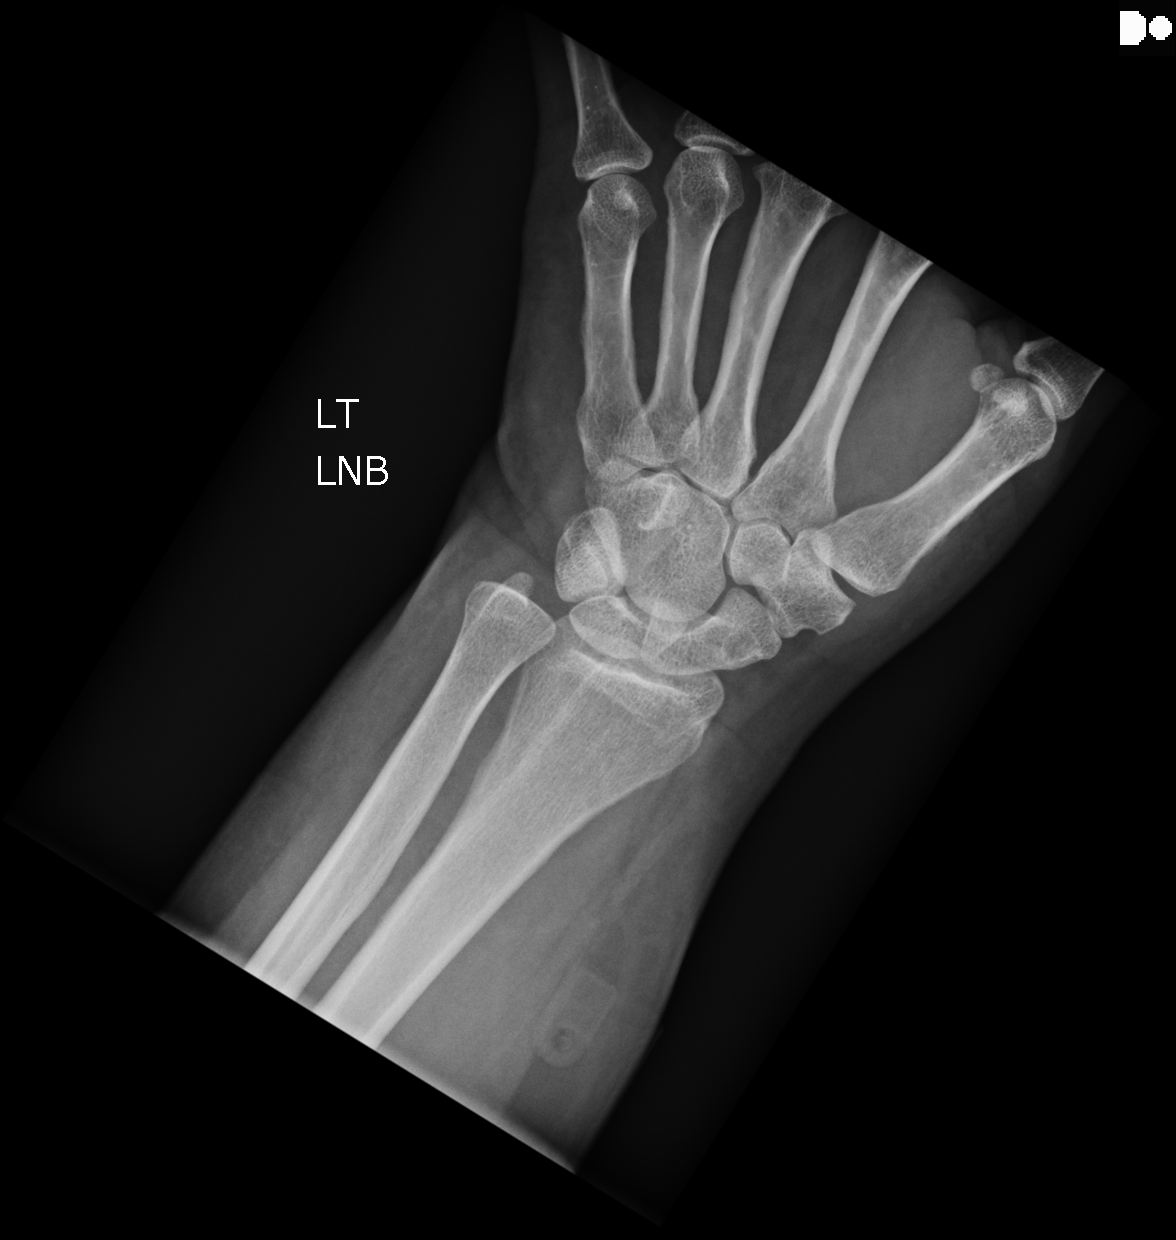
[im 2/4]
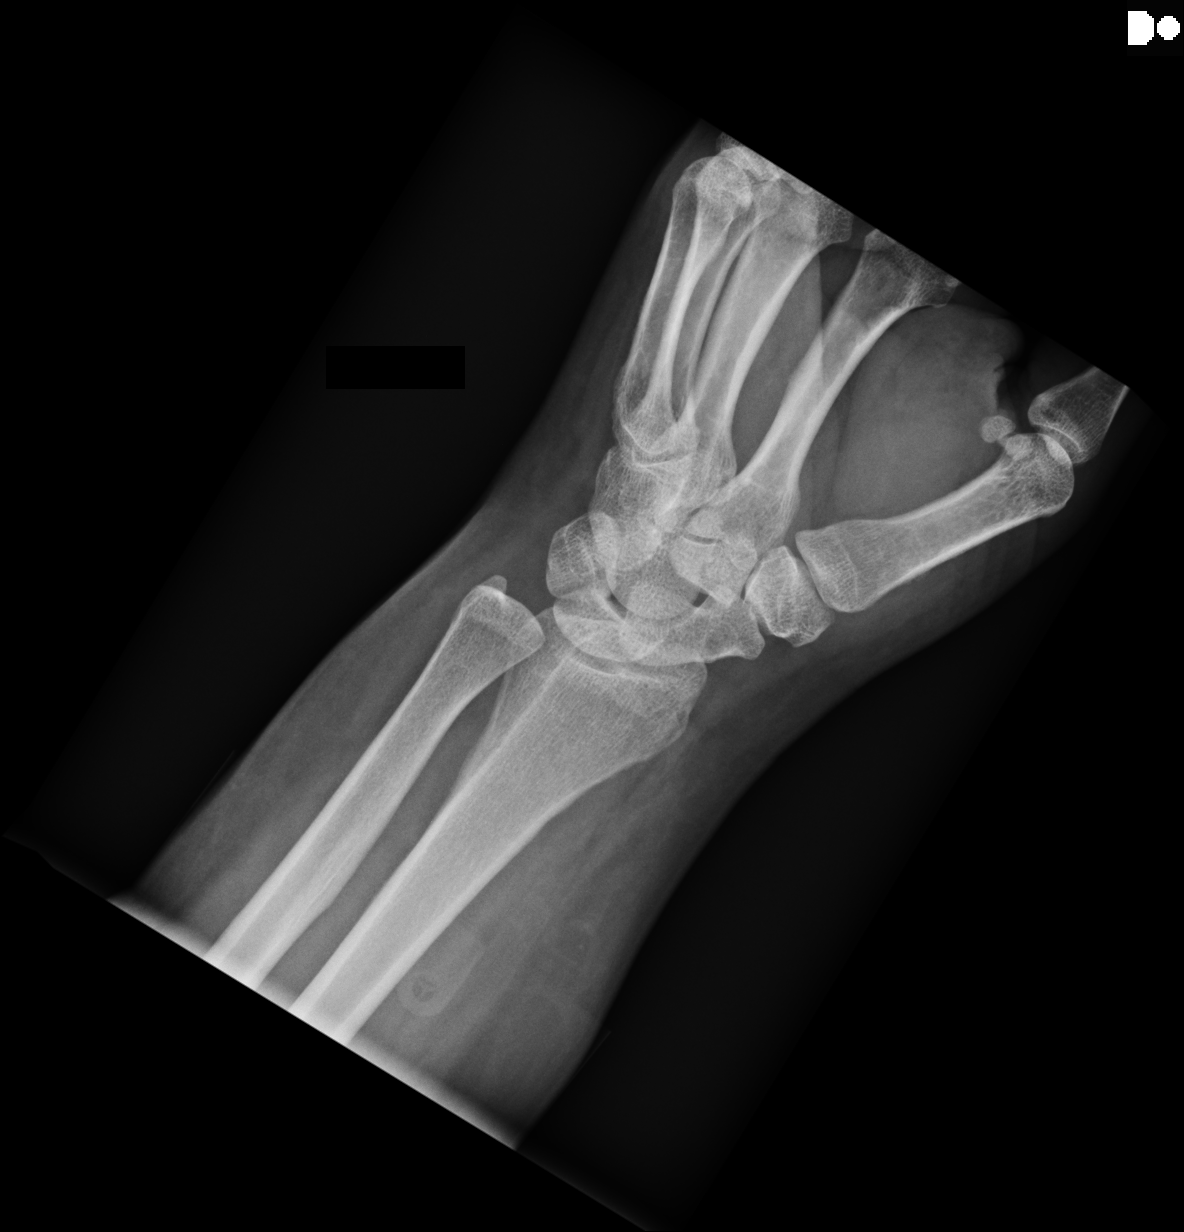
[im 3/4]
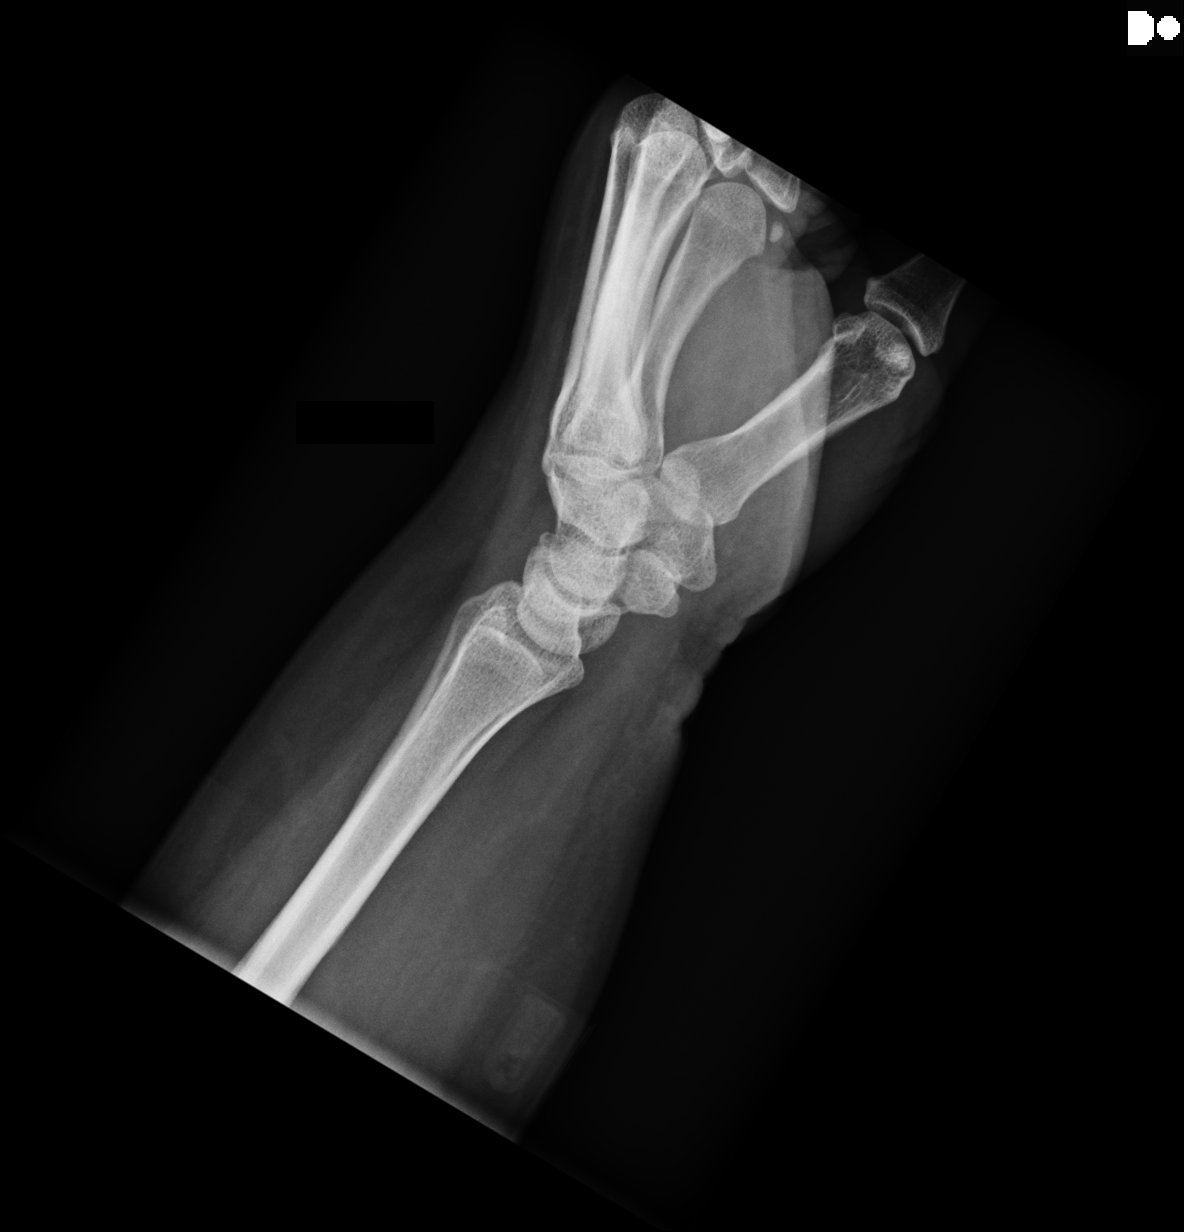
[im 4/4]
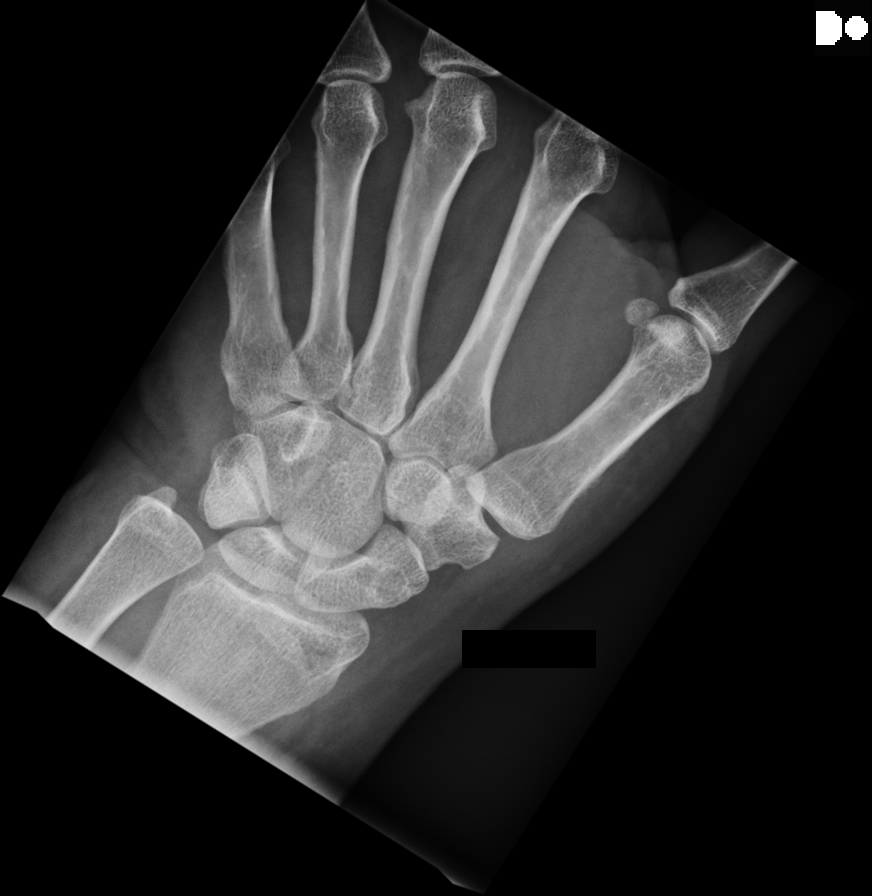

[4 of 4 positions shown; findings below may reference images not displayed]

FINDINGS: Clothing artifacts at distal forearm.

Osseous mineralization normal.

Joint spaces preserved.

No acute fracture, dislocation, or bone destruction.

No additional radiopaque foreign body identified.

Scattered soft tissue swelling.
IMPRESSION: Presumed clothing artifacts at distal forearm.

No definite additional radiopaque foreign bodies identified.

## 2015-09-14 ENCOUNTER — Emergency Department (HOSPITAL_COMMUNITY): Payer: Medicare Other

## 2015-09-14 ENCOUNTER — Encounter (HOSPITAL_COMMUNITY): Payer: Self-pay | Admitting: Emergency Medicine

## 2015-09-14 ENCOUNTER — Inpatient Hospital Stay (HOSPITAL_COMMUNITY)
Admission: EM | Admit: 2015-09-14 | Discharge: 2015-09-16 | DRG: 291 | Disposition: A | Discharge status: Home Health | Payer: Medicare Other | Attending: Internal Medicine | Admitting: Internal Medicine

## 2015-09-14 DIAGNOSIS — M549 Dorsalgia, unspecified: Secondary | ICD-10-CM | POA: Diagnosis present

## 2015-09-14 DIAGNOSIS — E662 Morbid (severe) obesity with alveolar hypoventilation: Secondary | ICD-10-CM | POA: Diagnosis present

## 2015-09-14 DIAGNOSIS — Z880 Allergy status to penicillin: Secondary | ICD-10-CM

## 2015-09-14 DIAGNOSIS — Z91199 Patient's noncompliance with other medical treatment and regimen due to unspecified reason: Secondary | ICD-10-CM

## 2015-09-14 DIAGNOSIS — G8929 Other chronic pain: Secondary | ICD-10-CM | POA: Diagnosis present

## 2015-09-14 DIAGNOSIS — E119 Type 2 diabetes mellitus without complications: Secondary | ICD-10-CM

## 2015-09-14 DIAGNOSIS — I5033 Acute on chronic diastolic (congestive) heart failure: Principal | ICD-10-CM | POA: Diagnosis present

## 2015-09-14 DIAGNOSIS — Z7984 Long term (current) use of oral hypoglycemic drugs: Secondary | ICD-10-CM

## 2015-09-14 DIAGNOSIS — Z87891 Personal history of nicotine dependence: Secondary | ICD-10-CM

## 2015-09-14 DIAGNOSIS — Z6841 Body Mass Index (BMI) 40.0 and over, adult: Secondary | ICD-10-CM

## 2015-09-14 DIAGNOSIS — Z79899 Other long term (current) drug therapy: Secondary | ICD-10-CM

## 2015-09-14 DIAGNOSIS — M25569 Pain in unspecified knee: Secondary | ICD-10-CM | POA: Diagnosis present

## 2015-09-14 DIAGNOSIS — I517 Cardiomegaly: Secondary | ICD-10-CM | POA: Diagnosis present

## 2015-09-14 DIAGNOSIS — I509 Heart failure, unspecified: Secondary | ICD-10-CM

## 2015-09-14 DIAGNOSIS — Z9119 Patient's noncompliance with other medical treatment and regimen: Secondary | ICD-10-CM

## 2015-09-14 DIAGNOSIS — E876 Hypokalemia: Secondary | ICD-10-CM | POA: Diagnosis present

## 2015-09-14 DIAGNOSIS — T502X5A Adverse effect of carbonic-anhydrase inhibitors, benzothiadiazides and other diuretics, initial encounter: Secondary | ICD-10-CM | POA: Diagnosis present

## 2015-09-14 DIAGNOSIS — R0989 Other specified symptoms and signs involving the circulatory and respiratory systems: Secondary | ICD-10-CM | POA: Diagnosis present

## 2015-09-14 DIAGNOSIS — J9601 Acute respiratory failure with hypoxia: Secondary | ICD-10-CM | POA: Diagnosis present

## 2015-09-14 DIAGNOSIS — Z9111 Patient's noncompliance with dietary regimen: Secondary | ICD-10-CM

## 2015-09-14 DIAGNOSIS — R0602 Shortness of breath: Secondary | ICD-10-CM | POA: Diagnosis not present

## 2015-09-14 DIAGNOSIS — F319 Bipolar disorder, unspecified: Secondary | ICD-10-CM | POA: Diagnosis present

## 2015-09-14 LAB — CBC
HEMATOCRIT: 45.8 % (ref 36.0–46.0)
Hemoglobin: 13.6 g/dL (ref 12.0–15.0)
MCH: 28.6 pg (ref 26.0–34.0)
MCHC: 29.7 g/dL — AB (ref 30.0–36.0)
MCV: 96.2 fL (ref 78.0–100.0)
Platelets: 171 10*3/uL (ref 150–400)
RBC: 4.76 MIL/uL (ref 3.87–5.11)
RDW: 14.1 % (ref 11.5–15.5)
WBC: 9.7 10*3/uL (ref 4.0–10.5)

## 2015-09-14 LAB — BASIC METABOLIC PANEL
Anion gap: 6 (ref 5–15)
BUN: 21 mg/dL — ABNORMAL HIGH (ref 6–20)
CHLORIDE: 98 mmol/L — AB (ref 101–111)
CO2: 38 mmol/L — AB (ref 22–32)
CREATININE: 0.74 mg/dL (ref 0.44–1.00)
Calcium: 8.6 mg/dL — ABNORMAL LOW (ref 8.9–10.3)
GFR calc Af Amer: >60 mL/min (ref >60)
GFR calc non Af Amer: >60 mL/min (ref >60)
GLUCOSE: 106 mg/dL — AB (ref 65–99)
POTASSIUM: 4.4 mmol/L (ref 3.5–5.1)
Sodium: 142 mmol/L (ref 135–145)

## 2015-09-14 LAB — I-STAT TROPONIN, ED: Troponin i, poc: 0 ng/mL (ref 0.00–0.08)

## 2015-09-14 MED ORDER — FUROSEMIDE 10 MG/ML IJ SOLN
40.0000 mg | Freq: Once | INTRAMUSCULAR | Status: AC
Start: 2015-09-14 — End: 2015-09-14
  Administered 2015-09-14: 40 mg via INTRAVENOUS
  Filled 2015-09-14: qty 4

## 2015-09-14 NOTE — ED Provider Notes (Signed)
CSN: [id number]     Arrival date & time 09/14/15  2124 History   First MD Initiated Contact with Patient 09/14/15 2150     Chief Complaint  Patient presents with  . Shortness of Breath  . Headache     (Consider location/radiation/quality/duration/timing/severity/associated sxs/prior Treatment) HPI   Patient is a 54 year old morbidly obese female with history of congestive heart failure on Lasix presenting today with shortness of breath. Patient takes 40 mg of Lasix twice a daily. Patient has been short of breath since her birthday on the [birth year]rd of this [birth year]. This is roughly 5 days ago. She's been increasing her Lasix taking it 3-4 times daily to try to get rid of her shortness of breath. Patient and patient's daughter reports that she's been having trouble maintaining her adherence to diet. Especially during her birthday and Christmas. Patient started some swelling to her lower extremities and shortness of breath. No fever no cough.  Patient is 85% on room air currently. She was 81% on arrival to the emergency department. On 2 L she is 93%.  Past Medical History  Diagnosis Date  . Bipolar disorder ([place])   . Chronic back pain   . Chronic knee pain   . Morbid obesity ([place])   . Obesity hypoventilation syndrome ([place])   . Depression   . Diabetes mellitus     Type II  . CHF (congestive heart failure) [facility])    Past Surgical History  Procedure Laterality Date  . Right oophorectomy     No family history on file. Social History  Substance Use Topics  . Smoking status: Former Research scientist (life sciences)  . Smokeless tobacco: None  . Alcohol Use: Yes   OB History    No data available     Review of Systems  Constitutional: Positive for fatigue. Negative for fever and activity change.  Eyes: Negative for discharge.  Respiratory: Positive for cough and shortness of breath. Negative for chest tightness.   Cardiovascular: Positive for leg swelling. Negative for chest pain.  Gastrointestinal: Negative for  abdominal distention.  Genitourinary: Negative for dysuria and difficulty urinating.  Musculoskeletal: Negative for joint swelling.  Skin: Negative for rash.  Allergic/Immunologic: Negative for immunocompromised state.  Neurological: Negative for seizures.  Psychiatric/Behavioral: Negative for agitation.      Allergies  Penicillins  Home Medications   Prior to Admission medications   Medication Sig Start Date End Date Taking? Authorizing Provider  acetaminophen (TYLENOL) 500 MG tablet Take 500 mg by mouth every 6 (six) hours as needed for mild pain.   Yes Historical Provider, MD  ARIPiprazole (ABILIFY) 10 MG tablet Take 10 mg by mouth at bedtime. Reported on 09/14/2015   Yes Historical Provider, MD  divalproex (DEPAKOTE) 500 MG DR tablet Take 500 mg by mouth at bedtime.   Yes Historical Provider, MD  furosemide (LASIX) 40 MG tablet Take 40 mg by mouth 2 (two) times daily.   Yes Historical Provider, MD  metFORMIN (GLUCOPHAGE) 500 MG tablet Take 500 mg by mouth 2 (two) times daily with a meal.   Yes Historical Provider, MD  traMADol (ULTRAM) 50 MG tablet Take 1 tablet (50 mg total) by mouth every 6 (six) hours as needed. Patient not taking: Reported on 02/27/2015 11/14/14   April Palumbo, MD   BP 146/70 mmHg  Pulse 68  Temp(Src) 98.9 F (37.2 C) (Oral)  Resp 17  Wt 340 lb (154.223 kg)  SpO2 95% Physical Exam  Constitutional: She is oriented to person, place, and time. She  appears well-developed and well-nourished.  Morbidly obese African American female.  HENT:  Head: Normocephalic and atraumatic.  Eyes: Conjunctivae are normal. Right eye exhibits no discharge.  Neck: Neck supple.  Cardiovascular: Normal rate, regular rhythm and normal heart sounds.   No murmur heard. Pulmonary/Chest: She has no wheezes. She has rales.  Patient mildly tachypnic.   Abdominal: Soft. She exhibits no distension. There is no tenderness.  Musculoskeletal: Normal range of motion. She exhibits no  edema.  Neurological: She is oriented to person, place, and time. No cranial nerve deficit.  Skin: Skin is warm and dry. No rash noted. She is not diaphoretic.  Psychiatric: She has a normal mood and affect. Her behavior is normal.  Nursing note and vitals reviewed.   ED Course  Procedures (including critical care time) Labs Review Labs Reviewed  BASIC METABOLIC PANEL - Abnormal; Notable for the following:    Chloride 98 (*)    CO2 38 (*)    Glucose, Bld 106 (*)    BUN 21 (*)    Calcium 8.6 (*)    All other components within normal limits  CBC - Abnormal; Notable for the following:    MCHC 29.7 (*)    All other components within normal limits  BRAIN NATRIURETIC PEPTIDE  I-STAT TROPOININ, ED    Imaging Review Dg Chest 2 View  09/14/2015  CLINICAL DATA:  Shortness of Breath EXAM: CHEST - 2 VIEW COMPARISON:  02/27/2015 FINDINGS: Cardiac shadow is enlarged. The lungs are well aerated bilaterally. Increased central vascular congestion is noted consistent with CHF. No pulmonary edema is noted. No bony abnormality is seen. IMPRESSION: Cardiomegaly and vascular congestion Electronically Signed   By: Inez Catalina M.D.   On: 09/14/2015 22:24   I have personally reviewed and evaluated these images and lab results as part of my medical decision-making.   EKG Interpretation   Date/Time:  Tuesday September 14 2015 22:47:39 EST Ventricular Rate:  71 PR Interval:  172 QRS Duration: 87 QT Interval:  400 QTC Calculation: 435 R Axis:   90 Text Interpretation:  Sinus rhythm Atrial premature complex Consider right  atrial enlargement Borderline right axis deviation Low voltage, precordial  leads No significant change since last tracing no acute ischemia Confirmed  by Gerald Leitz ([id number]) on 09/14/2015 10:52:20 PM      MDM   Final diagnoses:  Acute on chronic congestive heart failure, unspecified congestive heart failure type [facility])    Patient is a very pleasant 54 year old female  with morbid obesity, congestive heart failure on Lasix daily. Patient's been increasing her home Lasix to 3-4 times daily to try to help with her short of breath. She admits to dietary noncompliance. Patient is 81% on arrival to the ED. She is 89% currently without oxygen. On 2 L she is 94%. Her x-ray shows evidence of CHF. I believe this is a failure of PO Lasix. Patient will require IV diuretics. We'll start her on 40 mg of IV Lasix here in the emergency department.  Discussed with hospitilist, will admit to telemetry.   Courteney Julio Alm, MD 09/14/15 2337

## 2015-09-14 NOTE — ED Notes (Signed)
Patient c/o SOB & headache x6 days. History of CHF and headaches. Denies chest pain, denies increased swelling in lower extremities. Pedal pulses intact, no pitting edema. Takes lasix for CHF.

## 2015-09-15 ENCOUNTER — Observation Stay (HOSPITAL_BASED_OUTPATIENT_CLINIC_OR_DEPARTMENT_OTHER): Payer: Medicare Other

## 2015-09-15 ENCOUNTER — Encounter (HOSPITAL_COMMUNITY): Payer: Self-pay | Admitting: Internal Medicine

## 2015-09-15 DIAGNOSIS — Z7984 Long term (current) use of oral hypoglycemic drugs: Secondary | ICD-10-CM | POA: Diagnosis not present

## 2015-09-15 DIAGNOSIS — Z880 Allergy status to penicillin: Secondary | ICD-10-CM | POA: Diagnosis not present

## 2015-09-15 DIAGNOSIS — E662 Morbid (severe) obesity with alveolar hypoventilation: Secondary | ICD-10-CM | POA: Diagnosis present

## 2015-09-15 DIAGNOSIS — E119 Type 2 diabetes mellitus without complications: Secondary | ICD-10-CM

## 2015-09-15 DIAGNOSIS — Z6841 Body Mass Index (BMI) 40.0 and over, adult: Secondary | ICD-10-CM | POA: Diagnosis not present

## 2015-09-15 DIAGNOSIS — M549 Dorsalgia, unspecified: Secondary | ICD-10-CM | POA: Diagnosis present

## 2015-09-15 DIAGNOSIS — Z79899 Other long term (current) drug therapy: Secondary | ICD-10-CM | POA: Diagnosis not present

## 2015-09-15 DIAGNOSIS — E876 Hypokalemia: Secondary | ICD-10-CM | POA: Diagnosis present

## 2015-09-15 DIAGNOSIS — F3170 Bipolar disorder, currently in remission, most recent episode unspecified: Secondary | ICD-10-CM | POA: Diagnosis not present

## 2015-09-15 DIAGNOSIS — E118 Type 2 diabetes mellitus with unspecified complications: Secondary | ICD-10-CM

## 2015-09-15 DIAGNOSIS — G8929 Other chronic pain: Secondary | ICD-10-CM | POA: Diagnosis present

## 2015-09-15 DIAGNOSIS — R0989 Other specified symptoms and signs involving the circulatory and respiratory systems: Secondary | ICD-10-CM | POA: Diagnosis present

## 2015-09-15 DIAGNOSIS — Z9111 Patient's noncompliance with dietary regimen: Secondary | ICD-10-CM | POA: Diagnosis not present

## 2015-09-15 DIAGNOSIS — T502X5A Adverse effect of carbonic-anhydrase inhibitors, benzothiadiazides and other diuretics, initial encounter: Secondary | ICD-10-CM | POA: Diagnosis present

## 2015-09-15 DIAGNOSIS — R0602 Shortness of breath: Secondary | ICD-10-CM | POA: Diagnosis present

## 2015-09-15 DIAGNOSIS — F319 Bipolar disorder, unspecified: Secondary | ICD-10-CM | POA: Diagnosis present

## 2015-09-15 DIAGNOSIS — I509 Heart failure, unspecified: Secondary | ICD-10-CM | POA: Diagnosis not present

## 2015-09-15 DIAGNOSIS — Z87891 Personal history of nicotine dependence: Secondary | ICD-10-CM | POA: Diagnosis not present

## 2015-09-15 DIAGNOSIS — I5033 Acute on chronic diastolic (congestive) heart failure: Secondary | ICD-10-CM | POA: Diagnosis present

## 2015-09-15 DIAGNOSIS — J9601 Acute respiratory failure with hypoxia: Secondary | ICD-10-CM | POA: Diagnosis present

## 2015-09-15 DIAGNOSIS — I517 Cardiomegaly: Secondary | ICD-10-CM | POA: Diagnosis present

## 2015-09-15 DIAGNOSIS — M25569 Pain in unspecified knee: Secondary | ICD-10-CM | POA: Diagnosis present

## 2015-09-15 LAB — BASIC METABOLIC PANEL
ANION GAP: 8 (ref 5–15)
BUN: 18 mg/dL (ref 6–20)
CHLORIDE: 94 mmol/L — AB (ref 101–111)
CO2: 38 mmol/L — AB (ref 22–32)
Calcium: 8.6 mg/dL — ABNORMAL LOW (ref 8.9–10.3)
Creatinine, Ser: 0.57 mg/dL (ref 0.44–1.00)
GFR calc Af Amer: >60 mL/min (ref >60)
GFR calc non Af Amer: >60 mL/min (ref >60)
GLUCOSE: 124 mg/dL — AB (ref 65–99)
POTASSIUM: 3.5 mmol/L (ref 3.5–5.1)
Sodium: 140 mmol/L (ref 135–145)

## 2015-09-15 LAB — CBC
HEMATOCRIT: 45.4 % (ref 36.0–46.0)
HEMOGLOBIN: 13.3 g/dL (ref 12.0–15.0)
MCH: 28.1 pg (ref 26.0–34.0)
MCHC: 29.3 g/dL — AB (ref 30.0–36.0)
MCV: 96 fL (ref 78.0–100.0)
Platelets: 160 10*3/uL (ref 150–400)
RBC: 4.73 MIL/uL (ref 3.87–5.11)
RDW: 14.2 % (ref 11.5–15.5)
WBC: 9.4 10*3/uL (ref 4.0–10.5)

## 2015-09-15 LAB — COMPREHENSIVE METABOLIC PANEL
ALBUMIN: 3.6 g/dL (ref 3.5–5.0)
ALK PHOS: 69 U/L (ref 38–126)
ALT: 25 U/L (ref 14–54)
ANION GAP: 9 (ref 5–15)
AST: 13 U/L — ABNORMAL LOW (ref 15–41)
BILIRUBIN TOTAL: 0.8 mg/dL (ref 0.3–1.2)
BUN: 19 mg/dL (ref 6–20)
CALCIUM: 8.9 mg/dL (ref 8.9–10.3)
CO2: 39 mmol/L — ABNORMAL HIGH (ref 22–32)
Chloride: 93 mmol/L — ABNORMAL LOW (ref 101–111)
Creatinine, Ser: 0.69 mg/dL (ref 0.44–1.00)
GFR calc non Af Amer: >60 mL/min (ref >60)
GLUCOSE: 140 mg/dL — AB (ref 65–99)
Potassium: 3.5 mmol/L (ref 3.5–5.1)
Sodium: 141 mmol/L (ref 135–145)
TOTAL PROTEIN: 7.6 g/dL (ref 6.5–8.1)

## 2015-09-15 LAB — TROPONIN I
Troponin I: <0.03 ng/mL (ref <0.031)
Troponin I: <0.03 ng/mL (ref <0.031)

## 2015-09-15 LAB — BRAIN NATRIURETIC PEPTIDE: B NATRIURETIC PEPTIDE 5: 218.4 pg/mL — AB (ref 0.0–100.0)

## 2015-09-15 MED ORDER — PERFLUTREN LIPID MICROSPHERE
1.0000 mL | INTRAVENOUS | Status: AC | PRN
  Administered 2015-09-15: 3 mL via INTRAVENOUS

## 2015-09-15 MED ORDER — ACETAMINOPHEN 500 MG PO TABS: 500.0000 mg | ORAL_TABLET | Freq: Four times a day (QID) | ORAL | Status: DC | PRN

## 2015-09-15 MED ORDER — ARIPIPRAZOLE 10 MG PO TABS
10.0000 mg | ORAL_TABLET | Freq: Every day | ORAL | Status: DC
  Filled 2015-09-15: qty 1

## 2015-09-15 MED ORDER — FUROSEMIDE 10 MG/ML IJ SOLN
40.0000 mg | Freq: Four times a day (QID) | INTRAMUSCULAR | Status: DC
  Administered 2015-09-15 – 2015-09-16 (×5): 40 mg via INTRAVENOUS
  Filled 2015-09-15 (×5): qty 4

## 2015-09-15 MED ORDER — ASPIRIN EC 81 MG PO TBEC
162.0000 mg | DELAYED_RELEASE_TABLET | Freq: Every day | ORAL | Status: DC
  Filled 2015-09-15 (×2): qty 2

## 2015-09-15 MED ORDER — LISINOPRIL 5 MG PO TABS
5.0000 mg | ORAL_TABLET | Freq: Every day | ORAL | Status: DC
  Administered 2015-09-15: 5 mg via ORAL
  Filled 2015-09-15 (×2): qty 1

## 2015-09-15 MED ORDER — PERFLUTREN LIPID MICROSPHERE
INTRAVENOUS | Status: AC
  Filled 2015-09-15: qty 10

## 2015-09-15 MED ORDER — ENOXAPARIN SODIUM 80 MG/0.8ML ~~LOC~~ SOLN
80.0000 mg | SUBCUTANEOUS | Status: DC
  Filled 2015-09-15 (×2): qty 0.8

## 2015-09-15 MED ORDER — DIVALPROEX SODIUM 500 MG PO DR TAB
500.0000 mg | DELAYED_RELEASE_TABLET | Freq: Every day | ORAL | Status: DC
  Filled 2015-09-15: qty 1

## 2015-09-15 MED ORDER — METFORMIN HCL 500 MG PO TABS
500.0000 mg | ORAL_TABLET | Freq: Two times a day (BID) | ORAL | Status: DC
  Administered 2015-09-15 – 2015-09-16 (×3): 500 mg via ORAL
  Filled 2015-09-15 (×4): qty 1

## 2015-09-15 MED ORDER — SODIUM CHLORIDE 0.9 % IJ SOLN: 3.0000 mL | INTRAMUSCULAR | Status: DC | PRN

## 2015-09-15 MED ORDER — SODIUM CHLORIDE 0.9 % IJ SOLN
3.0000 mL | Freq: Two times a day (BID) | INTRAMUSCULAR | Status: DC
  Administered 2015-09-15: 3 mL via INTRAVENOUS

## 2015-09-15 MED ORDER — ENOXAPARIN SODIUM 40 MG/0.4ML ~~LOC~~ SOLN
40.0000 mg | SUBCUTANEOUS | Status: DC
  Filled 2015-09-15: qty 0.4

## 2015-09-15 MED ORDER — POTASSIUM CHLORIDE CRYS ER 20 MEQ PO TBCR
20.0000 meq | EXTENDED_RELEASE_TABLET | Freq: Two times a day (BID) | ORAL | Status: DC
  Administered 2015-09-15: 20 meq via ORAL
  Filled 2015-09-15 (×2): qty 1

## 2015-09-15 MED ORDER — PANTOPRAZOLE SODIUM 40 MG PO TBEC
40.0000 mg | DELAYED_RELEASE_TABLET | Freq: Every day | ORAL | Status: DC
  Administered 2015-09-15: 40 mg via ORAL
  Filled 2015-09-15 (×2): qty 1

## 2015-09-15 MED ORDER — SODIUM CHLORIDE 0.9 % IV SOLN: 250.0000 mL | INTRAVENOUS | Status: DC | PRN

## 2015-09-15 NOTE — Progress Notes (Signed)
Patient frequently taking nasal canula out of nose.  O2 sats drop down into the high 70's and low 80's without o2. Continue to explain to patient the importance of wearing oxygen at this time, patient says that she understands.  Will continue to monitor closely.

## 2015-09-15 NOTE — Care Management Note (Signed)
Case Management Note  Patient Details  Name: Jodi [name] MRN: [id number] Date of Birth: 1961/03/02  Subjective/Objective:  Noted on 02. Will monitor if needed @ home.                  Action/Plan:   Expected Discharge Date:                  Expected Discharge Plan:  Home/Self Care  In-House Referral:     Discharge planning Services  CM Consult  Post Acute Care Choice:    Choice offered to:     DME Arranged:    DME Agency:     HH Arranged:    HH Agency:     Status of Service:  In process, will continue to follow  Medicare Important Message Given:    Date Medicare IM Given:    Medicare IM give by:    Date Additional Medicare IM Given:    Additional Medicare Important Message give by:     If discussed at [place] of Stay Meetings, dates discussed:    Additional Comments:  Dessa Phi, RN 09/15/2015, 11:57 AM

## 2015-09-15 NOTE — ED Notes (Signed)
Patient given ginger ale to drink Patient's visitors given a cran-grape juice and a water to drink

## 2015-09-15 NOTE — Progress Notes (Signed)
  Echocardiogram 2D Echocardiogram with Definity has been performed.  Jodi [name] 09/15/2015, 2:22 PM

## 2015-09-15 NOTE — ED Notes (Signed)
Hospitalist at bedside 

## 2015-09-15 NOTE — Care Management Note (Signed)
Case Management Note  Patient Details  Name: Jodi [name] MRN: [id number] Date of Birth: Jan 30, 1961  Subjective/Objective: 54 y/o f admitted w/CHF. From home.                   Action/Plan:d/c plan home.   Expected Discharge Date:                  Expected Discharge Plan:  Home/Self Care  In-House Referral:     Discharge planning Services  CM Consult  Post Acute Care Choice:    Choice offered to:     DME Arranged:    DME Agency:     HH Arranged:    HH Agency:     Status of Service:  In process, will continue to follow  Medicare Important Message Given:    Date Medicare IM Given:    Medicare IM give by:    Date Additional Medicare IM Given:    Additional Medicare Important Message give by:     If discussed at [place] of Stay Meetings, dates discussed:    Additional Comments:  Dessa Phi, RN 09/15/2015, 11:56 AM

## 2015-09-15 NOTE — ED Notes (Signed)
Patient updated about delay in bed placement.

## 2015-09-15 NOTE — Progress Notes (Signed)
Patient refusing her lovenox shot and daily aspirin.  MD made aware.

## 2015-09-15 NOTE — H&P (Signed)
[facility] History and Physical  Jodi [name] [id number] DOB: 07-18-1961 DOA: 09/14/2015  Referring physician: Macarthur Critchley, MD PCP: Tomasita Morrow, MD   Chief Complaint: Shortness of breath.  HPI: Jodi [name] is a 54 y.o. female with a past medical history of unspecified CHF, bipolar disorder, chronic back pain, type 2 diabetes, morbid obesity who comes in the emergency department with a history of progressively worse shortness of breath since her birthday 4 days ago. When asked, the patient stated that she overindulged with food and drinks during her birthday and Christmas weekend. Her dyspnea is associated with lower extremity edema, PND and orthopnea. She denies chest pain, palpitations, dizziness or diaphoresis.  When seen in the emergency department, the patient was in no acute distress with supplemental oxygen. Initial O2 sat was 81% on arrival to the emergency department. Current O2 sat on the monitor is 96%.   Review of Systems:  Constitutional:  Positive fatigue.  No weight loss, night sweats, Fevers, chills.  HEENT:  Frequent frontal headaches, positive nasal congestion, post nasal drip,  Difficulty swallowing,Tooth/dental problems,Sore throat,  No sneezing, itching, ear ache,  Cardio-vascular:  Positive swelling in lower extremities, Orthopnea, PND. No chest pain, anasarca, dizziness, palpitations  GI:  No heartburn, indigestion, abdominal pain, nausea, vomiting, diarrhea, change in bowel habits, loss of appetite  Resp:  Positive dyspnea and nonproductive cough. Denies wheezing or hemoptysis.  Skin:  no rash or lesions.  GU:  no dysuria, change in color of urine, no urgency or frequency. No flank pain.  Musculoskeletal:  Frequent bradycardias or weightbearing joints. Frequent back pain.  Psych:  No change in mood or affect. No depression or anxiety. No memory loss.   Past Medical History  Diagnosis Date  . Bipolar disorder ([place])    . Chronic back pain   . Chronic knee pain   . Morbid obesity ([place])   . Obesity hypoventilation syndrome ([place])   . Depression   . Diabetes mellitus     Type II  . CHF (congestive heart failure) [facility])    Past Surgical History  Procedure Laterality Date  . Right oophorectomy     Social History:  reports that she has quit smoking. She does not have any smokeless tobacco history on file. She reports that she drinks alcohol. She reports that she does not use illicit drugs.  Allergies  Allergen Reactions  . Penicillins Other (See Comments)    Has patient had a PCN reaction causing immediate rash, facial/tongue/throat swelling, SOB or lightheadedness with hypotension: No Has patient had a PCN reaction causing severe rash involving mucus membranes or skin necrosis: No. Has patient had a PCN reaction that required hospitalization: No Has patient had a PCN reaction occurring within the last 10 years: No If all of the above answers are "NO", then may proceed with Cephalosporin use.     History reviewed. No pertinent family history.   Prior to Admission medications   Medication Sig Start Date End Date Taking? Authorizing Provider  acetaminophen (TYLENOL) 500 MG tablet Take 500 mg by mouth every 6 (six) hours as needed for mild pain.   Yes Historical Provider, MD  ARIPiprazole (ABILIFY) 10 MG tablet Take 10 mg by mouth at bedtime. Reported on 09/14/2015   Yes Historical Provider, MD  divalproex (DEPAKOTE) 500 MG DR tablet Take 500 mg by mouth at bedtime.   Yes Historical Provider, MD  furosemide (LASIX) 40 MG tablet Take 40 mg by mouth 2 (two) times daily.  Yes Historical Provider, MD  metFORMIN (GLUCOPHAGE) 500 MG tablet Take 500 mg by mouth 2 (two) times daily with a meal.   Yes Historical Provider, MD  traMADol (ULTRAM) 50 MG tablet Take 1 tablet (50 mg total) by mouth every 6 (six) hours as needed. Patient not taking: Reported on 02/27/2015 11/14/14   April Palumbo, MD   Physical  Exam: Filed Vitals:   09/14/15 2356 09/15/15 0015 09/15/15 0100 09/15/15 0115  BP: 113/70     Pulse: 68 68 68 66  Temp:      TempSrc:      Resp: 18 22 18 20   Weight:      SpO2: 94% 97% 94% 94%    Wt Readings from Last 3 Encounters:  09/14/15 154.223 kg (340 lb)  02/27/15 154.813 kg (341 lb 4.8 oz)  04/30/12 158.419 kg (349 lb 4 oz)    General:  Appears calm and comfortable Eyes: PERRL, normal lids, irises & conjunctiva ENT: grossly normal hearing, lips & tongue Neck: no LAD, masses or thyromegaly Cardiovascular: RRR, no m/r/g. 1+ bilateral LE edema. Telemetry: SR, no arrhythmias  Respiratory: Bibasilar Rales. Normal respiratory effort. Abdomen: Obese, soft, ntnd Skin: no rash or induration seen on limited exam Musculoskeletal: grossly normal tone BUE/BLE Psychiatric: grossly normal mood and affect, speech fluent and appropriate Neurologic: grossly non-focal.          Labs on Admission:  Basic Metabolic Panel:  Recent Labs Lab 09/14/15 2213  NA 142  K 4.4  CL 98*  CO2 38*  GLUCOSE 106*  BUN 21*  CREATININE 0.74  CALCIUM 8.6*   CBC:  Recent Labs Lab 09/14/15 2213  WBC 9.7  HGB 13.6  HCT 45.8  MCV 96.2  PLT 171    BNP (last 3 results)  Recent Labs  02/27/15 0616  BNP 23.2    Radiological Exams on Admission: Dg Chest 2 View  09/14/2015  CLINICAL DATA:  Shortness of Breath EXAM: CHEST - 2 VIEW COMPARISON:  02/27/2015 FINDINGS: Cardiac shadow is enlarged. The lungs are well aerated bilaterally. Increased central vascular congestion is noted consistent with CHF. No pulmonary edema is noted. No bony abnormality is seen. IMPRESSION: Cardiomegaly and vascular congestion Electronically Signed   By: Inez Catalina M.D.   On: 09/14/2015 22:24    EKG: Independently reviewed. Vent. rate 71 BPM PR interval 172 ms QRS duration 87 ms QT/QTc 400/435 ms P-R-T axes 59 90 48 Sinus rhythm Atrial premature complex Consider right atrial enlargement Borderline  right axis deviation Low voltage, precordial leads  Assessment/Plan Principal Problem:   CHF, stage C, unspecified failure chronicity, unspecified type Admit to telemetry. Cycle troponin levels. Fluid and sodium restriction. Continue furosemide IV Potassium supplementation. Start low-dose ACE inhibitor. Check echocardiogram to evaluate for systolic or diastolic dysfunction or a combination of both.  Active Problems:   Bipolar disorder (HCC) Continue Depakote 500 mg by mouth at bedtime. In the Continue Abilify 10 mg by mouth at bedtime.    Type 2 diabetes mellitus (HCC) Continue metformin 500 mg by mouth twice a day Carbohydrate modified diet. CBG monitoring while the patient is in the hospital.    Morbid obesity [facility]) The patient stated that she will try harder on lifestyle modifications to avoid episodes like this or other complications    Code Status: Full code. DVT Prophylaxis: Lovenox SQ. Family Communication:  Disposition Plan: Admit to telemetry for diuresis with IV furosemide.  Time spent: Over 70 minutes were spent in the process of this  admission.  Reubin Milan [facility] Pager [phone].

## 2015-09-15 NOTE — Progress Notes (Signed)
Progress Note   Jodi [name] [id number] DOB: 1960/12/14 DOA: 09/14/2015 PCP: Tomasita Morrow, MD   Brief Narrative:   Jodi [name] is an 54 y.o. female with a PMH of bipolar disorder, chronic back pain, type 2 diabetes, morbid obesity and unspecified CHF who was admitted 09/14/15 with chief complaint of a 4 day history of worsening dyspnea in the setting of dietary noncompliance. Upon initial evaluation in the ED, the patient was hypoxic with room air oxygen saturation of 81%.  Assessment/Plan:   Principal Problem:   CHF, stage C, unspecified failure chronicity, unspecified type - Chest x-ray done on admission: Cardiomegaly and pulmonary vascular congestion.  - Monitoring on telemetry. First troponin negative. - Continue fluid and sodium restriction. Continue IV Lasix. - Low-dose ACE inhibitor started. Continue aspirin. - Follow-up 2-D echocardiogram. Prior echo done 05/07/12 with EF 55-60 percent. - Monitor I/O balance and daily weights.  Active Problems:   Bipolar disorder ([place]) - Continue Abilify.    Type 2 diabetes mellitus (HCC) - Continue metformin. - Check hemoglobin A1c.    Morbid obesity ([place]) - Consult dietitian for diet education.    DVT Prophylaxis - Lovenox ordered.   Family Communication/Anticipated D/C date and plan/Code Status   Family Communication: Olivia Mackie (sister) at the bedside. Disposition Plan: Home when stable. Lives with sister. Anticipated D/C date:  Today or tomorrow pending 2-D echo results.  Code Status: Full code.   IV Access:    Peripheral IV   Procedures and diagnostic studies:   Dg Chest 2 View  09/14/2015  CLINICAL DATA:  Shortness of Breath EXAM: CHEST - 2 VIEW COMPARISON:  02/27/2015 FINDINGS: Cardiac shadow is enlarged. The lungs are well aerated bilaterally. Increased central vascular congestion is noted consistent with CHF. No pulmonary edema is noted. No bony abnormality is seen. IMPRESSION: Cardiomegaly  and vascular congestion Electronically Signed   By: Inez Catalina M.D.   On: 09/14/2015 22:24     Medical Consultants:    None.  Anti-Infectives:   Anti-infectives    None      Subjective:   Jodi [name] denies chest pain. Still a bit short of breath, but states it is better.  Feeling of "heaviness" in the legs. States she wants to go home today.  Objective:    Filed Vitals:   09/15/15 0100 09/15/15 0115 09/15/15 0221 09/15/15 0431  BP:   127/65 114/77  Pulse: 68 66 69 76  Temp:    98.1 F (36.7 C)  TempSrc:    Oral  Resp: 18 20 17 18   Height:    5\' 4"  (1.626 m)  Weight:    165.518 kg (364 lb 14.4 oz)  SpO2: 94% 94% 94% 94%    Intake/Output Summary (Last 24 hours) at 09/15/15 0723 Last data filed at 09/15/15 0700  Gross per 24 hour  Intake    240 ml  Output      0 ml  Net    240 ml   Filed Weights   09/14/15 2131 09/15/15 0431  Weight: 154.223 kg (340 lb) 165.518 kg (364 lb 14.4 oz)    Exam: Gen:  NAD, sitting up in chair Cardiovascular:  RRR, No M/R/G Respiratory:  Lungs diminished, no appreciable rales Gastrointestinal:  Abdomen soft, NT/ND, + BS Extremities:  Trace edema bilaterally   Data Reviewed:    Labs: Basic Metabolic Panel:  Recent Labs Lab 09/14/15 2213 09/15/15 0510  NA 142 140  K 4.4 3.5  CL 98* 94*  CO2 38* 38*  GLUCOSE 106* 124*  BUN 21* 18  CREATININE 0.74 0.57  CALCIUM 8.6* 8.6*   GFR Estimated Creatinine Clearance: 124.2 mL/min (by C-G formula based on Cr of 0.57).  CBC:  Recent Labs Lab 09/14/15 2213 09/15/15 0510  WBC 9.7 9.4  HGB 13.6 13.3  HCT 45.8 45.4  MCV 96.2 96.0  PLT 171 160   Cardiac Enzymes:  Recent Labs Lab 09/15/15 0510  TROPONINI <0.03   Microbiology No results found for this or any previous visit (from the past 240 hour(s)).   Medications:   . ARIPiprazole  10 mg Oral QHS  . aspirin EC  162 mg Oral Daily  . divalproex  500 mg Oral QHS  . enoxaparin (LOVENOX) injection  40  mg Subcutaneous Q24H  . furosemide  40 mg Intravenous Q6H  . lisinopril  5 mg Oral Daily  . metFORMIN  500 mg Oral BID WC  . pantoprazole  40 mg Oral Daily  . potassium chloride  20 mEq Oral BID  . sodium chloride  3 mL Intravenous Q12H  . sodium chloride  3 mL Intravenous Q12H   Continuous Infusions:   Time spent: 30 minutes.     [place] Pager [phone]. If unable to reach me by pager, please call my cell phone at [phone].  *Please refer to [email or web address], password [email or web address] to get updated schedule on who will round on this patient, as hospitalists switch teams weekly. If 7PM-7AM, please contact night-coverage at [email or web address], password [email or web address] for any overnight needs.  09/15/2015, 7:23 AM

## 2015-09-15 NOTE — Progress Notes (Signed)
Diet Education Note RD consulted for nutrition education regarding weight loss.  Body mass index is 62.6 kg/(m^2). Pt meets criteria for obese class III based on current BMI.  RD provided "Weight Loss Tips" handout from the Academy of [employer] Dietetics. Emphasized the importance of serving sizes and provided examples of correct portions of common foods. Discussed importance of controlled and consistent intake throughout the day. Provided examples of ways to balance meals/snacks and encouraged intake of high-fiber, whole grain complex carbohydrates. Emphasized the importance of hydration with calorie-free beverages and limiting sugar-sweetened beverages. Encouraged pt to discuss physical activity options with physician. Teach back method used.  Expect fair compliance.  Current diet order is Heart Healthy, patient is consuming approximately 100% of meals at this time. Labs and medications reviewed. No further nutrition interventions warranted at this time. RD contact information provided. If additional nutrition issues arise, please re-consult RD.  Satira Anis. Jereld Presti, MS, RD LDN After Hours/Weekend Pager [phone]

## 2015-09-16 DIAGNOSIS — Z9119 Patient's noncompliance with other medical treatment and regimen: Secondary | ICD-10-CM

## 2015-09-16 DIAGNOSIS — E876 Hypokalemia: Secondary | ICD-10-CM | POA: Diagnosis present

## 2015-09-16 DIAGNOSIS — Z91199 Patient's noncompliance with other medical treatment and regimen due to unspecified reason: Secondary | ICD-10-CM

## 2015-09-16 DIAGNOSIS — I5033 Acute on chronic diastolic (congestive) heart failure: Principal | ICD-10-CM

## 2015-09-16 LAB — BASIC METABOLIC PANEL
Anion gap: 8 (ref 5–15)
BUN: 17 mg/dL (ref 6–20)
CALCIUM: 8.7 mg/dL — AB (ref 8.9–10.3)
CO2: 41 mmol/L — ABNORMAL HIGH (ref 22–32)
CREATININE: 0.67 mg/dL (ref 0.44–1.00)
Chloride: 90 mmol/L — ABNORMAL LOW (ref 101–111)
Glucose, Bld: 153 mg/dL — ABNORMAL HIGH (ref 65–99)
Potassium: 3.2 mmol/L — ABNORMAL LOW (ref 3.5–5.1)
SODIUM: 139 mmol/L (ref 135–145)

## 2015-09-16 LAB — HEMOGLOBIN A1C
HEMOGLOBIN A1C: 6.8 % — AB (ref 4.8–5.6)
MEAN PLASMA GLUCOSE: 148 mg/dL

## 2015-09-16 MED ORDER — METOLAZONE 5 MG PO TABS
5.0000 mg | ORAL_TABLET | Freq: Every day | ORAL | Status: DC
  Filled 2015-09-16: qty 1

## 2015-09-16 MED ORDER — FUROSEMIDE 40 MG PO TABS: 80.0000 mg | ORAL_TABLET | Freq: Two times a day (BID) | ORAL | Status: DC

## 2015-09-16 MED ORDER — LISINOPRIL 5 MG PO TABS: 5.0000 mg | ORAL_TABLET | Freq: Every day | ORAL | Status: DC

## 2015-09-16 MED ORDER — POTASSIUM CHLORIDE CRYS ER 20 MEQ PO TBCR: 40.0000 meq | EXTENDED_RELEASE_TABLET | Freq: Two times a day (BID) | ORAL | Status: DC

## 2015-09-16 MED ORDER — FUROSEMIDE 10 MG/ML IJ SOLN: 80.0000 mg | Freq: Two times a day (BID) | INTRAMUSCULAR | Status: DC

## 2015-09-16 MED ORDER — ASPIRIN 162 MG PO TBEC: 162.0000 mg | DELAYED_RELEASE_TABLET | Freq: Every day | ORAL | Status: DC

## 2015-09-16 NOTE — Progress Notes (Signed)
[facility]  Patient Status: New  [facility] is providing the following services: RN  PCP is Dr. Precious Haws, referral accepted.   Jodi [name] 09/16/2015, 1:53 PM

## 2015-09-16 NOTE — Progress Notes (Signed)
[facility]  Patient Status: New  [facility] received a HHRN referral but cannot process due to no PCP available. Multiple attempts made to contact the patient to obtain PCP information because PCP listed nor the practice is presently seeing this patient. The referral is therefore declined. CM, [facility], notified.  Edwinna Areola 09/16/2015, 1:36 PM

## 2015-09-16 NOTE — Care Management Note (Signed)
Case Management Note  Patient Details  Name: Jodi [name] MRN: [id number] Date of Birth: 03-Jul-1961  Subjective/Objective: Spoke to nurse to confirm patient's pcp-Dr. Georgia Dom. [facility]. Updated [facility] rep Santiago Glad.                   Action/Plan:   Expected Discharge Date:                  Expected Discharge Plan:  [place]  In-House Referral:     Discharge planning Services  CM Consult  Post Acute Care Choice:    Choice offered to:  Patient  DME Arranged:    DME Agency:     HH Arranged:  RN [place] Agency:  [place]  Status of Service:  Completed, signed off  Medicare Important Message Given:    Date Medicare IM Given:    Medicare IM give by:    Date Additional Medicare IM Given:    Additional Medicare Important Message give by:     If discussed at [place] of Stay Meetings, dates discussed:    Additional Comments:  Dessa Phi, RN 09/16/2015, 1:56 PM

## 2015-09-16 NOTE — Discharge Summary (Signed)
Physician Discharge Summary  Jodi [name] [id number] DOB: June 02, 1961 DOA: 09/14/2015  PCP: Jodi Morrow, MD  Admit date: 09/14/2015 Discharge date: 09/16/2015   Recommendations for Outpatient Follow-Up:   1. Close F/U needed, as the patient has refused any further inpatient treatment. 2. Home health CHF orders written to assess home situation and provide a RN, CM to help with transition home.   Discharge Diagnosis:   Principal Problem:    Acute on chronic diastolic CHF (congestive heart failure) (HCC) Active Problems:    Bipolar disorder (HCC)    Type 2 diabetes mellitus (HCC)    Morbid obesity (HCC)    Non compliance with medical treatment    Hypokalemia    Acute respiratory failure with hypoxia   Discharge disposition:  Home.    Discharge Condition: Improved. Refused further inpatient care.  Diet recommendation: Low sodium, heart healthy.  Carbohydrate-modified.    History of Present Illness:   Jodi [name] is an 54 y.o. female with a PMH of bipolar disorder, chronic back pain, type 2 diabetes, morbid obesity and unspecified CHF who was admitted 09/14/15 with chief complaint of a 4 day history of worsening dyspnea in the setting of dietary noncompliance. Upon initial evaluation in the ED, the patient was hypoxic with room air oxygen saturation of 81%.  Hospital Course by Problem:   Principal Problem:  Acute on chronic diastolic CHF / Acute respiratory failure with hypoxia - Chest x-ray done on admission: Cardiomegaly and pulmonary vascular congestion.  - Monitored on telemetry. Troponins negative. No ischemic events. - Continue fluid and sodium restriction. Diuresed but on 09/16/15, refused further inpatient tx and requested D/C. - Prior echo done 05/07/12 with EF 55-60 percent. Repeat echo without change. - Although she desaturates off of oxygen, refused to wear oxygen.  Active Problems:  Hypokalemia - Secondary to diuretics. Increased  potassium supplementation.   Bipolar disorder ([place]) - Continue Abilify and Depakote.   Type 2 diabetes mellitus (HCC) - Continue metformin. - Hemoglobin A1c 6.8%, controlled.   Morbid obesity ([place]) - Provided with diet education by dietitian on 09/15/15.    Medical Consultants:    None.   Discharge Exam:   Filed Vitals:   09/15/15 2023 09/16/15 0626  BP: 105/59 122/74  Pulse: 74 76  Temp: 97.8 F (36.6 C) 98.2 F (36.8 C)  Resp: 20 20   Filed Vitals:   09/15/15 1454 09/15/15 2023 09/16/15 0626 09/16/15 0900  BP: 116/69 105/59 122/74   Pulse: 70 74 76   Temp: 98.5 F (36.9 C) 97.8 F (36.6 C) 98.2 F (36.8 C)   TempSrc: Oral Oral Oral   Resp: 20 20 20    Height:      Weight:   164.928 kg (363 lb 9.6 oz)   SpO2: 100% 95% 95% 85%    Gen:  NAD, morbidly obese Cardiovascular:  RRR, No M/R/G Respiratory: Lungs CTAB, no respiratory distress Gastrointestinal: Abdomen soft, NT/ND with normal active bowel sounds. Extremities: No C/E/C   The results of significant diagnostics from this hospitalization (including imaging, microbiology, ancillary and laboratory) are listed below for reference.     Procedures and Diagnostic Studies:   Dg Chest 2 View  09/14/2015  CLINICAL DATA:  Shortness of Breath EXAM: CHEST - 2 VIEW COMPARISON:  02/27/2015 FINDINGS: Cardiac shadow is enlarged. The lungs are well aerated bilaterally. Increased central vascular congestion is noted consistent with CHF. No pulmonary edema is noted. No bony abnormality is seen. IMPRESSION: Cardiomegaly and vascular congestion Electronically Signed  By: Inez Catalina M.D.   On: 09/14/2015 22:24     Labs:   Basic Metabolic Panel:  Recent Labs Lab 09/14/15 2213 09/15/15 0510 09/15/15 1126 09/16/15 0450  NA 142 140 141 139  K 4.4 3.5 3.5 3.2*  CL 98* 94* 93* 90*  CO2 38* 38* 39* 41*  GLUCOSE 106* 124* 140* 153*  BUN 21* 18 19 17   CREATININE 0.74 0.57 0.69 0.67  CALCIUM 8.6* 8.6* 8.9 8.7*    GFR Estimated Creatinine Clearance: 123.9 mL/min (by C-G formula based on Cr of 0.67). Liver Function Tests:  Recent Labs Lab 09/15/15 1126  AST 13*  ALT 25  ALKPHOS 69  BILITOT 0.8  PROT 7.6  ALBUMIN 3.6   CBC:  Recent Labs Lab 09/14/15 2213 09/15/15 0510  WBC 9.7 9.4  HGB 13.6 13.3  HCT 45.8 45.4  MCV 96.2 96.0  PLT 171 160   Cardiac Enzymes:  Recent Labs Lab 09/15/15 0510 09/15/15 1126  TROPONINI <0.03 <0.03   Hgb A1c  Recent Labs  09/15/15 0510  HGBA1C 6.8*     Discharge Instructions:       Discharge Instructions    (HEART FAILURE PATIENTS) Call MD:  Anytime you have any of the following symptoms: 1) 3 pound weight gain in 24 hours or 5 pounds in 1 week 2) shortness of breath, with or without a dry hacking cough 3) swelling in the hands, feet or stomach 4) if you have to sleep on extra pillows at night in order to breathe.    Complete by:  As directed      Diet - low sodium heart healthy    Complete by:  As directed      Diet Carb Modified    Complete by:  As directed      Discharge instructions    Complete by:  As directed   You were treated for heart failure in the hospital.  To prevent exacerbations of your heart failure, it is important that you check your weight at the same time every day, and that if you gain over 3 pounds in 24 hours or 5 lbs in 1 week, OR you develop worsening swelling to the legs, experience more shortness of breath or chest pain, take an extra dose of Lasix and call your Primary MD or cardiologist.   Follow a heart healthy, low salt diet and restrict your fluid intake to 1.5 liters a day or less.     Face-to-face encounter (required for Medicare/Medicaid patients)    Complete by:  As directed   I RAMA,CHRISTINA certify that this patient is under my care and that I, or a nurse practitioner or physician's assistant working with me, had a face-to-face encounter that meets the physician face-to-face encounter requirements with  this patient on 09/16/2015. The encounter with the patient was in whole, or in part for the following medical condition(s) which is the primary reason for home health care (List medical condition): the patient is non-compliant and at high risk for re-hospitalization.  Refusing any further inpatient treatment despite needing oxygen to maintain her oxygen saturations.  Needs assessment of home environment, education, evaluation of ability to follow treatment plan.  The encounter with the patient was in whole, or in part, for the following medical condition, which is the primary reason for home health care:  Acute on chronic diastolic CHF  I certify that, based on my findings, the following services are medically necessary home health services:  Nursing  Reason  for Medically Necessary Home Health Services:   Skilled Nursing- Changes in Medication/Medication Management Skilled Nursing- Skilled Assessment/Observation Skilled Nursing- Teaching of Disease Process/Symptom Management    My clinical findings support the need for the above services:  Unable to leave home safely without assistance and/or assistive device  Further, I certify that my clinical findings support that this patient is homebound due to:  Unable to leave home safely without assistance     Heart failure home health orders    Complete by:  As directed   Heart Failure Follow-up Care:  Verify follow-up appointments per Patient Discharge Instructions. Confirm transportation arranged. Reconcile home medications with discharge medication list. Remove discontinued medications from use. Assist patient/caregiver to manage medications using pill box. Reinforce low sodium food selection Assessments: Vital signs and oxygen saturation at each visit. Assess home environment for safety concerns, caregiver support and availability of low-sodium foods. Consult Education officer, museum, PT/OT, Dietitian, and CNA based on assessments. Perform comprehensive  cardiopulmonary assessment. Notify MD for any change in condition or weight gain of 3 pounds in one day or 5 pounds in one week with symptoms. Daily Weights and Symptom Monitoring: Ensure patient has access to scales. Teach patient/caregiver to weigh daily before breakfast and after voiding using same scale and record.    Teach patient/caregiver to track weight and symptoms and when to notify Provider. Activity: Develop individualized activity plan with patient/caregiver.  Heart Failure Follow-up Care:  Or per Doctor (see comments)  Obtain the following labs:  Basic Metabolic Panel  Lab frequency:  Weekly  Fax lab results to:  Other see comments  Diet:  Low Sodium Heart Healthy  Fluid restrictions:  1200 mL Fluid  Consult:  Case manager  Initiate Heart Failure Clinic Diuretic Protocol to be used by [place] only ( to be ordered by Heart Failure Team Providers Only):  Yes     Increase activity slowly    Complete by:  As directed             Medication List    STOP taking these medications        traMADol 50 MG tablet  Commonly known as:  ULTRAM      TAKE these medications        acetaminophen 500 MG tablet  Commonly known as:  TYLENOL  Take 500 mg by mouth every 6 (six) hours as needed for mild pain.     ARIPiprazole 10 MG tablet  Commonly known as:  ABILIFY  Take 10 mg by mouth at bedtime. Reported on 09/14/2015     divalproex 500 MG DR tablet  Commonly known as:  DEPAKOTE  Take 500 mg by mouth at bedtime.     furosemide 40 MG tablet  Commonly known as:  LASIX  Take 2 tablets (80 mg total) by mouth 2 (two) times daily.     lisinopril 5 MG tablet  Commonly known as:  PRINIVIL,ZESTRIL  Take 1 tablet (5 mg total) by mouth daily.     metFORMIN 500 MG tablet  Commonly known as:  GLUCOPHAGE  Take 500 mg by mouth 2 (two) times daily with a meal.     potassium chloride SA 20 MEQ tablet  Commonly known as:  K-DUR,KLOR-CON  Take 2 tablets (40 mEq  total) by mouth 2 (two) times daily.       Follow-up Information    Follow up with Jodi Morrow, MD.   Specialty:  Family Medicine   Contact information:   [phone]  [place] [phone]        Time coordinating discharge: 35 minutes.  Signed:  RAMA,CHRISTINA  Pager [phone] [facility] 09/16/2015, 9:45 AM

## 2015-09-16 NOTE — Progress Notes (Signed)
Patient refusing all medications and care today. Went over avs summary with patient and family.  Explained importance of taking medications as prescribed and to go to follow up appointment.  Spoke with Juliann Pulse, case manager about getting patient set up with [facility] RN.  AVS summary and HF packet and information given to patient.  Will continue to monitor closely.

## 2015-09-16 NOTE — Care Management Note (Signed)
Case Management Note  Patient Details  Name: Jodi [name] MRN: [id number] Date of Birth: November 17, 1960  Subjective/Objective:    Order for HHRN-disease mgmnt, compliance. [facility] chosen-[facility] rep Santiago Glad aware of order, & d/c. Noted patient refuses to wear 02.       Action/Plan:d/c home w/HHC.   Expected Discharge Date:                  Expected Discharge Plan:  [place]  In-House Referral:     Discharge planning Services  CM Consult  Post Acute Care Choice:    Choice offered to:  Patient  DME Arranged:    DME Agency:     HH Arranged:  RN [place] Agency:  [facility]  Status of Service:  Completed, signed off  Medicare Important Message Given:    Date Medicare IM Given:    Medicare IM give by:    Date Additional Medicare IM Given:    Additional Medicare Important Message give by:     If discussed at [place] of Stay Meetings, dates discussed:    Additional Comments:  Dessa Phi, RN 09/16/2015, 12:22 PM

## 2015-09-16 NOTE — Progress Notes (Signed)
Patient refusing to wear nasal canula and take IV lasix.  Patient states she is ready to leave and feels fine.  Explained importance of taking medications and the risk of leaving the hospital at this time.  MD made aware.  Will continue to monitor closely.

## 2015-09-16 NOTE — Discharge Instructions (Signed)

## 2015-09-17 ENCOUNTER — Other Ambulatory Visit (HOSPITAL_COMMUNITY): Payer: Medicare Other

## 2015-11-16 ENCOUNTER — Observation Stay (HOSPITAL_COMMUNITY)
Admission: EM | Admit: 2015-11-16 | Discharge: 2015-11-19 | Disposition: A | Discharge status: Home / Self Care | Payer: Medicare Other | Attending: Cardiology | Admitting: Cardiology

## 2015-11-16 ENCOUNTER — Encounter (HOSPITAL_COMMUNITY): Payer: Self-pay | Admitting: Emergency Medicine

## 2015-11-16 ENCOUNTER — Emergency Department (HOSPITAL_COMMUNITY): Payer: Medicare Other

## 2015-11-16 DIAGNOSIS — R7989 Other specified abnormal findings of blood chemistry: Secondary | ICD-10-CM | POA: Diagnosis present

## 2015-11-16 DIAGNOSIS — I509 Heart failure, unspecified: Secondary | ICD-10-CM

## 2015-11-16 DIAGNOSIS — E662 Morbid (severe) obesity with alveolar hypoventilation: Secondary | ICD-10-CM | POA: Diagnosis not present

## 2015-11-16 DIAGNOSIS — Z7982 Long term (current) use of aspirin: Secondary | ICD-10-CM | POA: Insufficient documentation

## 2015-11-16 DIAGNOSIS — Z880 Allergy status to penicillin: Secondary | ICD-10-CM | POA: Insufficient documentation

## 2015-11-16 DIAGNOSIS — G8929 Other chronic pain: Secondary | ICD-10-CM | POA: Diagnosis not present

## 2015-11-16 DIAGNOSIS — I272 Other secondary pulmonary hypertension: Secondary | ICD-10-CM | POA: Insufficient documentation

## 2015-11-16 DIAGNOSIS — I2609 Other pulmonary embolism with acute cor pulmonale: Secondary | ICD-10-CM | POA: Diagnosis not present

## 2015-11-16 DIAGNOSIS — R778 Other specified abnormalities of plasma proteins: Secondary | ICD-10-CM

## 2015-11-16 DIAGNOSIS — I2781 Cor pulmonale (chronic): Secondary | ICD-10-CM | POA: Diagnosis not present

## 2015-11-16 DIAGNOSIS — F319 Bipolar disorder, unspecified: Secondary | ICD-10-CM | POA: Insufficient documentation

## 2015-11-16 DIAGNOSIS — Z6841 Body Mass Index (BMI) 40.0 and over, adult: Secondary | ICD-10-CM | POA: Insufficient documentation

## 2015-11-16 DIAGNOSIS — E119 Type 2 diabetes mellitus without complications: Secondary | ICD-10-CM | POA: Diagnosis not present

## 2015-11-16 DIAGNOSIS — I10 Essential (primary) hypertension: Secondary | ICD-10-CM | POA: Insufficient documentation

## 2015-11-16 DIAGNOSIS — Z87891 Personal history of nicotine dependence: Secondary | ICD-10-CM | POA: Insufficient documentation

## 2015-11-16 DIAGNOSIS — Z7984 Long term (current) use of oral hypoglycemic drugs: Secondary | ICD-10-CM | POA: Diagnosis not present

## 2015-11-16 DIAGNOSIS — I5033 Acute on chronic diastolic (congestive) heart failure: Secondary | ICD-10-CM | POA: Diagnosis not present

## 2015-11-16 DIAGNOSIS — E785 Hyperlipidemia, unspecified: Secondary | ICD-10-CM | POA: Insufficient documentation

## 2015-11-16 DIAGNOSIS — I712 Thoracic aortic aneurysm, without rupture: Secondary | ICD-10-CM | POA: Insufficient documentation

## 2015-11-16 DIAGNOSIS — R0600 Dyspnea, unspecified: Secondary | ICD-10-CM

## 2015-11-16 LAB — BASIC METABOLIC PANEL
ANION GAP: 12 (ref 5–15)
BUN: 47 mg/dL — AB (ref 6–20)
CALCIUM: 8.8 mg/dL — AB (ref 8.9–10.3)
CO2: 36 mmol/L — AB (ref 22–32)
CREATININE: 1.23 mg/dL — AB (ref 0.44–1.00)
Chloride: 94 mmol/L — ABNORMAL LOW (ref 101–111)
GFR calc Af Amer: 56 mL/min — ABNORMAL LOW (ref >60)
GFR, EST NON AFRICAN AMERICAN: 48 mL/min — AB (ref >60)
GLUCOSE: 233 mg/dL — AB (ref 65–99)
Potassium: 3.9 mmol/L (ref 3.5–5.1)
Sodium: 142 mmol/L (ref 135–145)

## 2015-11-16 LAB — I-STAT TROPONIN, ED: Troponin i, poc: 0.41 ng/mL (ref 0.00–0.08)

## 2015-11-16 MED ORDER — FUROSEMIDE 10 MG/ML IJ SOLN
40.0000 mg | Freq: Once | INTRAMUSCULAR | Status: AC
  Administered 2015-11-17: 40 mg via INTRAVENOUS
  Filled 2015-11-16: qty 4

## 2015-11-16 NOTE — ED Notes (Signed)
Elevated troponin, pt to go back next

## 2015-11-16 NOTE — ED Notes (Signed)
Charge nurse morgan notified of pt troponin. WIll go in next open room.

## 2015-11-16 NOTE — ED Provider Notes (Signed)
CSN: [id number]     Arrival date & time 11/16/15  2217 History   By signing my name below, I, Jodi [name], attest that this documentation has been prepared under the direction and in the presence of Veryl Speak, MD.  Electronically Signed: Forrestine [name], ED Scribe. 11/16/2015. 11:29 PM.   Chief Complaint  Patient presents with  . Chest Pain  . Shortness of Breath   Patient is a 55 y.o. female presenting with chest pain. The history is provided by the patient. No language interpreter was used.  Chest Pain Pain location:  Unable to specify Pain radiates to:  Does not radiate Pain radiates to the back: no   Pain severity:  Moderate Onset quality:  Gradual Duration:  1 day Timing:  Constant Relieved by:  None tried Worsened by:  Nothing tried Ineffective treatments:  None tried Associated symptoms: cough, lower extremity edema and shortness of breath   Associated symptoms: no abdominal pain, no fever, no headache, no nausea and not vomiting     HPI Comments: Taiya Marica Tenner is a 55 y.o. female with a PMHx of CHF and DM who presents to the Emergency Department complaining of constant, ongoing chest pain x 1 day. Discomfort is described as "a funnt feeling in my chest". No aggravating or alleviating factors at this time. Pt also reports shortness of breath, lower extremity swelling, and productive cough. No OTC medications or home remedies attempted prior to arrival. No recent fever, chills, nausea, vomiting, or abdominal pain. Pt takes Lasix 40 mg daily and denies any recent missed doses. Ms. Nuehring states she is due to see a cardiologist in the next few days.  PCP: Tomasita Morrow, MD    Past Medical History  Diagnosis Date  . Bipolar disorder ([place])   . Chronic back pain   . Chronic knee pain   . Morbid obesity ([place])   . Obesity hypoventilation syndrome ([place])   . Depression   . Diabetes mellitus     Type II  . CHF (congestive heart failure) [facility])    Past Surgical History   Procedure Laterality Date  . Right oophorectomy     No family history on file. Social History  Substance Use Topics  . Smoking status: Former Research scientist (life sciences)  . Smokeless tobacco: None  . Alcohol Use: Yes   OB History    No data available     Review of Systems  Constitutional: Negative for fever and chills.  Respiratory: Positive for cough and shortness of breath.   Cardiovascular: Positive for chest pain and leg swelling.  Gastrointestinal: Negative for nausea, vomiting, abdominal pain and diarrhea.  Neurological: Negative for headaches.  Psychiatric/Behavioral: Negative for confusion.  All other systems reviewed and are negative.     Allergies  Penicillins  Home Medications   Prior to Admission medications   Medication Sig Start Date End Date Taking? Authorizing Provider  acetaminophen (TYLENOL) 500 MG tablet Take 500 mg by mouth every 6 (six) hours as needed for mild pain.    Historical Provider, MD  ARIPiprazole (ABILIFY) 10 MG tablet Take 10 mg by mouth at bedtime. Reported on 09/14/2015    Historical Provider, MD  aspirin EC 162 MG EC tablet Take 1 tablet (162 mg total) by mouth daily. 09/16/15   Venetia Maxon Rama, MD  divalproex (DEPAKOTE) 500 MG DR tablet Take 500 mg by mouth at bedtime.    Historical Provider, MD  furosemide (LASIX) 40 MG tablet Take 2 tablets (80 mg total) by mouth 2 (  two) times daily. 09/16/15   Venetia Maxon Rama, MD  lisinopril (PRINIVIL,ZESTRIL) 5 MG tablet Take 1 tablet (5 mg total) by mouth daily. 09/16/15   Venetia Maxon Rama, MD  metFORMIN (GLUCOPHAGE) 500 MG tablet Take 500 mg by mouth 2 (two) times daily with a meal.    Historical Provider, MD  potassium chloride SA (K-DUR,KLOR-CON) 20 MEQ tablet Take 2 tablets (40 mEq total) by mouth 2 (two) times daily. 09/16/15   Venetia Maxon Rama, MD   Triage Vitals: BP 124/71 mmHg  Pulse 80  Temp(Src) 99.4 F (37.4 C) (Oral)  Resp 22  SpO2 92%   Physical Exam  Constitutional: She is oriented to person,  place, and time. She appears well-developed and well-nourished. No distress.  HENT:  Head: Normocephalic and atraumatic.  Eyes: EOM are normal.  Neck: Normal range of motion.  Cardiovascular: Normal rate, regular rhythm and normal heart sounds.   Pulmonary/Chest: Effort normal. No respiratory distress. She has no wheezes. She has rales.  Slight rales in the bases bilaterally   Abdominal: Soft. She exhibits no distension. There is no tenderness.  Musculoskeletal: Normal range of motion. She exhibits edema.  2 plus pitting edema in bilateral lower extremities   Neurological: She is alert and oriented to person, place, and time.  Skin: Skin is warm and dry.  Psychiatric: She has a normal mood and affect. Judgment normal.  Nursing note and vitals reviewed.   ED Course  Procedures (including critical care time)  DIAGNOSTIC STUDIES: Oxygen Saturation is 92% on RA, low by my interpretation.    COORDINATION OF CARE: 11:28 PM- Will order CXR, blood work, and EKG. Will give IV Lasix. Discussed treatment plan with pt at bedside and pt agreed to plan.     Labs Review Labs Reviewed  CBC - Abnormal; Notable for the following:    RBC 5.55 (*)    HCT 48.3 (*)    MCH 23.2 (*)    MCHC 26.7 (*)    RDW 19.3 (*)    All other components within normal limits  I-STAT TROPOININ, ED - Abnormal; Notable for the following:    Troponin i, poc 0.41 (*)    All other components within normal limits  BASIC METABOLIC PANEL    Imaging Review Dg Chest 2 View  11/16/2015  CLINICAL DATA:  Subacute onset of shortness of breath. Bilateral lower extremity swelling. Initial encounter. EXAM: CHEST  2 VIEW COMPARISON:  Chest radiograph performed 09/14/2015 FINDINGS: The lungs are well-aerated. Vascular congestion is noted, with mildly increased interstitial markings, raising question for mild interstitial edema. There is no evidence of focal opacification, pleural effusion or pneumothorax. The heart is enlarged.  No  acute osseous abnormalities are seen. IMPRESSION: Vascular congestion and cardiomegaly, with mildly increased interstitial markings, raising question for mild interstitial edema. Electronically Signed   By: Garald Balding M.D.   On: 11/16/2015 23:11   I have personally reviewed and evaluated these images and lab results as part of my medical decision-making.   EKG Interpretation   Date/Time:  Tuesday November 16 2015 22:24:20 EST Ventricular Rate:  86 PR Interval:  166 QRS Duration: 86 QT Interval:  364 QTC Calculation: 435 R Axis:   129 Text Interpretation:  Normal sinus rhythm Right axis deviation Low voltage  QRS Cannot rule out Anterior infarct , age undetermined T wave  abnormality, consider inferolateral ischemia Abnormal ECG Confirmed by  Florita Nitsch  MD, Segundo Makela ([id number]) on 11/16/2015 11:14:57 PM      MDM  Final diagnoses:  None    Patient presents here with complaints of shortness of breath and tightness in the chest that has been ongoing throughout the day. She has a history of diastolic heart failure. Her workup today reveals an elevated troponin and elevated BNP. She also shows signs of early interstitial edema on her chest x-ray. She is in no respiratory distress and is not hypoxic, however she will require admission due to the elevated troponin and what appeared to be ST segment changes in the lateral leads. I've spoken with Dr. Einar Gip from the cardiology service who agrees to admit.   I personally performed the services described in this documentation, which was scribed in my presence. The recorded information has been reviewed and is accurate.       Veryl Speak, MD 11/17/15 [phone]

## 2015-11-16 NOTE — ED Notes (Signed)
Pt states she was taking lasix, CP started today, described it "funny feeling in my chest". Pt states she feels like shes in fluid overload. Pt c/o increased swelling in her feet. Pt respirations equal and unlabored. VSS. Pt is morbidly obese

## 2015-11-17 ENCOUNTER — Inpatient Hospital Stay (HOSPITAL_COMMUNITY): Payer: Medicare Other

## 2015-11-17 DIAGNOSIS — I509 Heart failure, unspecified: Secondary | ICD-10-CM

## 2015-11-17 DIAGNOSIS — I5033 Acute on chronic diastolic (congestive) heart failure: Secondary | ICD-10-CM | POA: Diagnosis not present

## 2015-11-17 LAB — TROPONIN I
TROPONIN I: 0.53 ng/mL — AB (ref <0.031)
TROPONIN I: 0.46 ng/mL — AB (ref <0.031)
Troponin I: 0.61 ng/mL (ref <0.031)
Troponin I: 0.44 ng/mL — ABNORMAL HIGH (ref <0.031)
Troponin I: 0.42 ng/mL — ABNORMAL HIGH (ref <0.031)

## 2015-11-17 LAB — GLUCOSE, CAPILLARY
GLUCOSE-CAPILLARY: 131 mg/dL — AB (ref 65–99)
GLUCOSE-CAPILLARY: 157 mg/dL — AB (ref 65–99)
Glucose-Capillary: 125 mg/dL — ABNORMAL HIGH (ref 65–99)
Glucose-Capillary: 147 mg/dL — ABNORMAL HIGH (ref 65–99)

## 2015-11-17 LAB — BRAIN NATRIURETIC PEPTIDE
B NATRIURETIC PEPTIDE 5: 514.7 pg/mL — AB (ref 0.0–100.0)
B Natriuretic Peptide: 559.7 pg/mL — ABNORMAL HIGH (ref 0.0–100.0)

## 2015-11-17 LAB — BASIC METABOLIC PANEL
Anion gap: 10 (ref 5–15)
BUN: 45 mg/dL — AB (ref 6–20)
CO2: 39 mmol/L — ABNORMAL HIGH (ref 22–32)
Calcium: 8.7 mg/dL — ABNORMAL LOW (ref 8.9–10.3)
Chloride: 95 mmol/L — ABNORMAL LOW (ref 101–111)
Creatinine, Ser: 1.04 mg/dL — ABNORMAL HIGH (ref 0.44–1.00)
GFR calc Af Amer: >60 mL/min (ref >60)
GFR, EST NON AFRICAN AMERICAN: 59 mL/min — AB (ref >60)
GLUCOSE: 158 mg/dL — AB (ref 65–99)
POTASSIUM: 3.7 mmol/L (ref 3.5–5.1)
SODIUM: 144 mmol/L (ref 135–145)

## 2015-11-17 LAB — CBC
HEMATOCRIT: 48.3 % — AB (ref 36.0–46.0)
HEMOGLOBIN: 12.9 g/dL (ref 12.0–15.0)
MCH: 23.2 pg — AB (ref 26.0–34.0)
MCHC: 26.7 g/dL — AB (ref 30.0–36.0)
MCV: 87 fL (ref 78.0–100.0)
Platelets: 187 10*3/uL (ref 150–400)
RBC: 5.55 MIL/uL — ABNORMAL HIGH (ref 3.87–5.11)
RDW: 19.3 % — AB (ref 11.5–15.5)
WBC: 10.4 10*3/uL (ref 4.0–10.5)

## 2015-11-17 MED ORDER — METFORMIN HCL 500 MG PO TABS
500.0000 mg | ORAL_TABLET | Freq: Two times a day (BID) | ORAL | Status: DC
  Administered 2015-11-17 – 2015-11-18 (×3): 500 mg via ORAL
  Filled 2015-11-17 (×3): qty 1

## 2015-11-17 MED ORDER — ENOXAPARIN SODIUM 100 MG/ML ~~LOC~~ SOLN
85.0000 mg | SUBCUTANEOUS | Status: DC
  Administered 2015-11-17: 85 mg via SUBCUTANEOUS
  Filled 2015-11-17: qty 1

## 2015-11-17 MED ORDER — ARIPIPRAZOLE 10 MG PO TABS
10.0000 mg | ORAL_TABLET | Freq: Every day | ORAL | Status: DC
  Administered 2015-11-18: 10 mg via ORAL
  Filled 2015-11-17 (×2): qty 1

## 2015-11-17 MED ORDER — TICAGRELOR 90 MG PO TABS
90.0000 mg | ORAL_TABLET | Freq: Two times a day (BID) | ORAL | Status: DC
  Filled 2015-11-17 (×3): qty 1

## 2015-11-17 MED ORDER — DIVALPROEX SODIUM 125 MG PO CSDR
500.0000 mg | DELAYED_RELEASE_CAPSULE | Freq: Every day | ORAL | Status: DC
  Administered 2015-11-18: 500 mg via ORAL
  Filled 2015-11-17 (×2): qty 4

## 2015-11-17 MED ORDER — POTASSIUM CHLORIDE CRYS ER 20 MEQ PO TBCR
20.0000 meq | EXTENDED_RELEASE_TABLET | Freq: Two times a day (BID) | ORAL | Status: DC
  Administered 2015-11-17 – 2015-11-19 (×4): 20 meq via ORAL
  Filled 2015-11-17 (×5): qty 1

## 2015-11-17 MED ORDER — FUROSEMIDE 40 MG PO TABS
40.0000 mg | ORAL_TABLET | Freq: Two times a day (BID) | ORAL | Status: DC
  Administered 2015-11-17 – 2015-11-19 (×5): 40 mg via ORAL
  Filled 2015-11-17 (×5): qty 1

## 2015-11-17 MED ORDER — ATORVASTATIN CALCIUM 40 MG PO TABS
40.0000 mg | ORAL_TABLET | Freq: Every day | ORAL | Status: DC
  Administered 2015-11-17 – 2015-11-18 (×2): 40 mg via ORAL
  Filled 2015-11-17 (×2): qty 1

## 2015-11-17 MED ORDER — ASPIRIN EC 81 MG PO TBEC
81.0000 mg | DELAYED_RELEASE_TABLET | Freq: Every day | ORAL | Status: DC
  Administered 2015-11-17 – 2015-11-19 (×2): 81 mg via ORAL
  Filled 2015-11-17 (×3): qty 1

## 2015-11-17 MED ORDER — ASPIRIN EC 81 MG PO TBEC: 162.0000 mg | DELAYED_RELEASE_TABLET | Freq: Every day | ORAL | Status: DC

## 2015-11-17 MED ORDER — ACETAMINOPHEN 500 MG PO TABS
500.0000 mg | ORAL_TABLET | Freq: Four times a day (QID) | ORAL | Status: DC | PRN
  Filled 2015-11-17: qty 1

## 2015-11-17 MED ORDER — METOPROLOL TARTRATE 12.5 MG HALF TABLET
12.5000 mg | ORAL_TABLET | Freq: Two times a day (BID) | ORAL | Status: DC
  Administered 2015-11-17: 12.5 mg via ORAL
  Filled 2015-11-17 (×3): qty 1

## 2015-11-17 MED ORDER — LISINOPRIL 5 MG PO TABS
5.0000 mg | ORAL_TABLET | Freq: Every morning | ORAL | Status: DC
  Filled 2015-11-17 (×2): qty 1

## 2015-11-17 NOTE — H&P (Signed)
Jodi [name] is an 55 y.o. female.   Chief Complaint: Chest pain, SOB HPI: Jodi [name]  is a 55 y.o. female with a history of Bipolar disorder, DM type 2 diabetes mellitus, HTN, chronic diastolic heart failure, morbid obesity, and history of poor compliance with medications who presented with worsening edema, shortness of breath, and intermittent episodes of chest pain. She was recently seen on an outpatient basis 2 weeks ago to establish care for management on recent episode of acute on chronic diastolic heart failure. Medical therapy was adjusted, however, she admits to not taking medications as she should for fear of side effects. Last episode of chest pain was yesterday, prior to arrival to hospital. No recurrence of pain since. She states pain typically occurs when laying flat, not necessarily with exertion, although she is markedly sedentary.   Past Medical History  Diagnosis Date  . Bipolar disorder ([place])   . Chronic back pain   . Chronic knee pain   . Morbid obesity ([place])   . Obesity hypoventilation syndrome ([place])   . Depression   . Diabetes mellitus     Type II  . CHF (congestive heart failure) [facility])     Past Surgical History  Procedure Laterality Date  . Right oophorectomy      No family history on file. Social History:  reports that she has quit smoking. She does not have any smokeless tobacco history on file. She reports that she drinks alcohol. She reports that she does not use illicit drugs.  Allergies:  Allergies  Allergen Reactions  . Penicillins Other (See Comments)    Has patient had a PCN reaction causing immediate rash, facial/tongue/throat swelling, SOB or lightheadedness with hypotension: No Has patient had a PCN reaction causing severe rash involving mucus membranes or skin necrosis: No. Has patient had a PCN reaction that required hospitalization: No Has patient had a PCN reaction occurring within the last 10 years: No If all of the above answers  are "NO", then may proceed with Cephalosporin use.     Review of Systems - History obtained from the patient General ROS: positive for  - fatigue negative for - fever or malaise Respiratory ROS: positive for - cough, orthopnea, shortness of breath and clear phlegm negative for - tachypnea or wheezing Cardiovascular ROS: positive for - chest pain, dyspnea on exertion, edema and orthopnea negative for - orthopnea Gastrointestinal ROS: no abdominal pain, change in bowel habits, or black or bloody stools Neurological ROS: no TIA or stroke symptoms  Blood pressure 118/72, pulse 67, temperature 97.9 F (36.6 C), temperature source Oral, resp. rate 20, height '5\' 5"'  (1.651 m), weight 168.823 kg (372 lb 3 oz), SpO2 91 %.   General appearance: alert, cooperative, appears stated age, no distress and morbidly obese Eyes: negative Neck: no adenopathy, no carotid bruit, no JVD, supple, symmetrical, trachea midline and thyroid not enlarged, symmetric, no tenderness/mass/nodules Resp: clear to auscultation bilaterally and coughs when attempting to breath deep Chest wall: no tenderness Cardio: regular rate and rhythm, S1, S2 normal, systolic murmur: early systolic 3/6,   at 2nd left intercostal space, at 2nd right intercostal space and diastolic murmur: early diastolic 1/6,   at 2nd left intercostal space GI: soft, non-tender; bowel sounds normal; no masses,  no organomegaly and abdominal obesity Extremities: edema 2-3+ bilaterally Pulses: difficult to feel due to obesity Skin: Skin color, texture, turgor normal. No rashes or lesions Neurologic: Grossly normal  Results for orders placed or performed during the  hospital encounter of 11/16/15 (from the past 48 hour(s))  Basic metabolic panel     Status: Abnormal   Collection Time: 11/16/15 10:10 PM  Result Value Ref Range   Sodium 142 135 - 145 mmol/L   Potassium 3.9 3.5 - 5.1 mmol/L   Chloride 94 (L) 101 - 111 mmol/L   CO2 36 (H) 22 - 32 mmol/L    Glucose, Bld 233 (H) 65 - 99 mg/dL   BUN 47 (H) 6 - 20 mg/dL   Creatinine, Ser 1.23 (H) 0.44 - 1.00 mg/dL   Calcium 8.8 (L) 8.9 - 10.3 mg/dL   GFR calc non Af Amer 48 (L) >60 mL/min   GFR calc Af Amer 56 (L) >60 mL/min    Comment: (NOTE) The eGFR has been calculated using the CKD EPI equation. This calculation has not been validated in all clinical situations. eGFR's persistently <60 mL/min signify possible Chronic Kidney Disease.    Anion gap 12 5 - 15  CBC     Status: Abnormal   Collection Time: 11/16/15 10:10 PM  Result Value Ref Range   WBC 10.4 4.0 - 10.5 K/uL   RBC 5.55 (H) 3.87 - 5.11 MIL/uL   Hemoglobin 12.9 12.0 - 15.0 g/dL   HCT 48.3 (H) 36.0 - 46.0 %   MCV 87.0 78.0 - 100.0 fL   MCH 23.2 (L) 26.0 - 34.0 pg   MCHC 26.7 (L) 30.0 - 36.0 g/dL   RDW 19.3 (H) 11.5 - 15.5 %   Platelets 187 150 - 400 K/uL    Comment: PLATELET COUNT CONFIRMED BY SMEAR  I-stat troponin, ED (not at [facility], [facility])     Status: Abnormal   Collection Time: 11/16/15 10:38 PM  Result Value Ref Range   Troponin i, poc 0.41 (HH) 0.00 - 0.08 ng/mL   Comment NOTIFIED PHYSICIAN    Comment 3            Comment: Due to the release kinetics of cTnI, a negative result within the first hours of the onset of symptoms does not rule out myocardial infarction with certainty. If myocardial infarction is still suspected, repeat the test at appropriate intervals.   Brain natriuretic peptide     Status: Abnormal   Collection Time: 11/16/15 11:51 PM  Result Value Ref Range   B Natriuretic Peptide 559.7 (H) 0.0 - 100.0 pg/mL  Troponin I (q 6hr x 3)     Status: Abnormal   Collection Time: 11/17/15  5:00 AM  Result Value Ref Range   Troponin I 0.61 (HH) <0.031 ng/mL    Comment:        POSSIBLE MYOCARDIAL ISCHEMIA. SERIAL TESTING RECOMMENDED. CRITICAL RESULT CALLED TO, READ BACK BY AND VERIFIED WITH: GARDNER,D RN 11/17/2015 0629 JORDANS   Brain natriuretic peptide     Status: Abnormal   Collection Time:  11/17/15  5:00 AM  Result Value Ref Range   B Natriuretic Peptide 514.7 (H) 0.0 - 100.0 pg/mL  Basic metabolic panel     Status: Abnormal   Collection Time: 11/17/15  5:00 AM  Result Value Ref Range   Sodium 144 135 - 145 mmol/L   Potassium 3.7 3.5 - 5.1 mmol/L   Chloride 95 (L) 101 - 111 mmol/L   CO2 39 (H) 22 - 32 mmol/L   Glucose, Bld 158 (H) 65 - 99 mg/dL   BUN 45 (H) 6 - 20 mg/dL   Creatinine, Ser 1.04 (H) 0.44 - 1.00 mg/dL   Calcium 8.7 (L) 8.9 -  10.3 mg/dL   GFR calc non Af Amer 59 (L) >60 mL/min   GFR calc Af Amer >60 >60 mL/min    Comment: (NOTE) The eGFR has been calculated using the CKD EPI equation. This calculation has not been validated in all clinical situations. eGFR's persistently <60 mL/min signify possible Chronic Kidney Disease.    Anion gap 10 5 - 15  Glucose, capillary     Status: Abnormal   Collection Time: 11/17/15  6:03 AM  Result Value Ref Range   Glucose-Capillary 131 (H) 65 - 99 mg/dL   Dg Chest 2 View  11/16/2015  CLINICAL DATA:  Subacute onset of shortness of breath. Bilateral lower extremity swelling. Initial encounter. EXAM: CHEST  2 VIEW COMPARISON:  Chest radiograph performed 09/14/2015 FINDINGS: The lungs are well-aerated. Vascular congestion is noted, with mildly increased interstitial markings, raising question for mild interstitial edema. There is no evidence of focal opacification, pleural effusion or pneumothorax. The heart is enlarged.  No acute osseous abnormalities are seen. IMPRESSION: Vascular congestion and cardiomegaly, with mildly increased interstitial markings, raising question for mild interstitial edema. Electronically Signed   By: Garald Balding M.D.   On: 11/16/2015 23:11    Labs:   Lab Results  Component Value Date   WBC 10.4 11/16/2015   HGB 12.9 11/16/2015   HCT 48.3* 11/16/2015   MCV 87.0 11/16/2015   PLT 187 11/16/2015    Recent Labs Lab 11/17/15 0500  NA 144  K 3.7  CL 95*  CO2 39*  BUN 45*  CREATININE 1.04*   CALCIUM 8.7*  GLUCOSE 158*    Lipid Panel     Component Value Date/Time   CHOL 173 12/18/2013 0709   TRIG 101 12/18/2013 0709   HDL 48 12/18/2013 0709   VLDL 20 12/18/2013 0709   LDLCALC 105* 12/18/2013 0709    BNP (last 3 results)  Recent Labs  09/14/15 2213 11/16/15 2351 11/17/15 0500  BNP 218.4* 559.7* 514.7*    HEMOGLOBIN A1C Lab Results  Component Value Date   HGBA1C 6.8* 09/15/2015   MPG 148 09/15/2015    Cardiac Panel (last 3 results)  Recent Labs  09/15/15 0510 09/15/15 1126 11/17/15 0500  TROPONINI <0.03 <0.03 0.61*    Lab Results  Component Value Date   CKTOTAL 48 03/04/2014   CKMB 0.6 03/04/2014   TROPONINI 0.61* 11/17/2015     TSH No results for input(s): TSH in the last 8760 hours.  EKG 11/16/2015: Sinus rhythm at a rate of 86 bpm, rightward axis, normal intervals, poor R wave progression, diffuse T-wave inversion, low voltage complexes.  Compared to EKG on 09/15/2015, poor R-wave progression and T-wave inversion is new.  Medications Prior to Admission  Medication Sig Dispense Refill  . acetaminophen (TYLENOL) 500 MG tablet Take 500 mg by mouth every 6 (six) hours as needed for mild pain.    . ARIPiprazole (ABILIFY) 15 MG tablet Take 15 mg by mouth at bedtime.    . divalproex (DEPAKOTE) 500 MG DR tablet Take 500 mg by mouth at bedtime.    . furosemide (LASIX) 40 MG tablet Take 2 tablets (80 mg total) by mouth 2 (two) times daily. 120 tablet 2  . metFORMIN (GLUCOPHAGE) 500 MG tablet Take 500 mg by mouth 2 (two) times daily with a meal.    . aspirin EC 162 MG EC tablet Take 1 tablet (162 mg total) by mouth daily. (Patient not taking: Reported on 11/16/2015)    . lisinopril (PRINIVIL,ZESTRIL) 5 MG tablet  Take 1 tablet (5 mg total) by mouth daily. (Patient not taking: Reported on 11/16/2015) 30 tablet 2  . potassium chloride SA (K-DUR,KLOR-CON) 20 MEQ tablet Take 2 tablets (40 mEq total) by mouth 2 (two) times daily. (Patient not taking: Reported  on 11/16/2015) 120 tablet 2      Current facility-administered medications:  .  acetaminophen (TYLENOL) tablet 500 mg, 500 mg, Oral, Q6H PRN, Adrian Prows, MD, 500 mg at 11/17/15 0828 .  ARIPiprazole (ABILIFY) tablet 10 mg, 10 mg, Oral, QHS, Adrian Prows, MD .  aspirin EC tablet 162 mg, 162 mg, Oral, Daily, Adrian Prows, MD .  divalproex (DEPAKOTE SPRINKLE) capsule 500 mg, 500 mg, Oral, QHS, Adrian Prows, MD .  furosemide (LASIX) tablet 40 mg, 40 mg, Oral, BID, Adrian Prows, MD, 40 mg at 11/17/15 0300 .  lisinopril (PRINIVIL,ZESTRIL) tablet 5 mg, 5 mg, Oral, q morning - 10a, Adrian Prows, MD .  metFORMIN (GLUCOPHAGE) tablet 500 mg, 500 mg, Oral, BID WC, Adrian Prows, MD, 500 mg at 11/17/15 0731 .  potassium chloride SA (K-DUR,KLOR-CON) CR tablet 20 mEq, 20 mEq, Oral, BID, Adrian Prows, MD, 20 mEq at 11/17/15 0300    Assessment/Plan 1. Elevated troponin 2. Abnormal EKG 3. Acute on chronic diastolic heart failure 4. Benign essential hypertension 5. Type 2 Diabetes Mellitus 6. Mild hyperlipidemia (LDL on 12/18/2013: 105) 7. Chest pain, atypical  8. Dyspnea on exertion 9. Morbid obesity  Recommendation: Patient is extremely high risk given hypertension, diabetes, and mild hyperlipidemia.  EKG reveals poor R-wave progression and T-wave inversion which is new from December 2016.  We will continue to cycle troponins to evaluate for NSTEMI versus demand ischemia from acute on chronic diastolic heart failure.  We had a long discussion with the patient and her sister.  Discussed that given patient's admitted history of noncompliance with medications, concern is for compliance with antiplatelet therapy if we were to proceed with coronary angiogram and possible PCI.  Patient understands and cannot ensure adequate compliance with antiplatelet therapy and prefers to proceed with medical therapy at this time.  We'll begin atorvastatin, metoprolol, and Brilinta.  Reduce dose of aspirin to 81 mg.  I have also added Lovenox for VTE  prophylaxis.  We'll obtain an echocardiogram to evaluate LVEF.  Rachel Bo, NP-C 11/17/2015, 8:57 AM [facility]. [place] Pager: [phone] Office: [phone] If no answer: Cell:  [phone]

## 2015-11-17 NOTE — Progress Notes (Addendum)
ANTICOAGULATION CONSULT NOTE - Initial Consult  Pharmacy Consult for Lovenox Indication: VTE prophylaxis  Allergies  Allergen Reactions  . Penicillins Other (See Comments)    Has patient had a PCN reaction causing immediate rash, facial/tongue/throat swelling, SOB or lightheadedness with hypotension: No Has patient had a PCN reaction causing severe rash involving mucus membranes or skin necrosis: No. Has patient had a PCN reaction that required hospitalization: No Has patient had a PCN reaction occurring within the last 10 years: No If all of the above answers are "NO", then may proceed with Cephalosporin use.     Patient Measurements: Height: 5\' 5"  (165.1 cm) Weight: (!) 372 lb 3 oz (168.823 kg) IBW/kg (Calculated) : 57   Vital Signs: Temp: 97.9 F (36.6 C) (03/01 0207) Temp Source: Oral (03/01 0207) BP: 118/72 mmHg (03/01 0207) Pulse Rate: 67 (03/01 0207)  Labs:  Recent Labs  11/16/15 2210 11/17/15 0500  HGB 12.9  --   HCT 48.3*  --   PLT 187  --   CREATININE 1.23* 1.04*  TROPONINI  --  0.61*    Estimated Creatinine Clearance: 98.1 mL/min (by C-G formula based on Cr of 1.04).   Medical History: Past Medical History  Diagnosis Date  . Bipolar disorder ([place])   . Chronic back pain   . Chronic knee pain   . Morbid obesity ([place])   . Obesity hypoventilation syndrome ([place])   . Depression   . Diabetes mellitus     Type II  . CHF (congestive heart failure) ([facility])     Medications:  Prescriptions prior to admission  Medication Sig Dispense Refill Last Dose  . acetaminophen (TYLENOL) 500 MG tablet Take 500 mg by mouth every 6 (six) hours as needed for mild pain.   Past Week at Unknown time  . ARIPiprazole (ABILIFY) 15 MG tablet Take 15 mg by mouth at bedtime.   11/15/2015 at Unknown time  . divalproex (DEPAKOTE) 500 MG DR tablet Take 500 mg by mouth at bedtime.   11/15/2015 at Unknown time  . furosemide (LASIX) 40 MG tablet Take 2 tablets (80 mg total) by mouth 2  (two) times daily. 120 tablet 2 11/16/2015 at Unknown time  . metFORMIN (GLUCOPHAGE) 500 MG tablet Take 500 mg by mouth 2 (two) times daily with a meal.   11/16/2015 at Unknown time  . aspirin EC 162 MG EC tablet Take 1 tablet (162 mg total) by mouth daily. (Patient not taking: Reported on 11/16/2015)   Not Taking at Unknown time  . lisinopril (PRINIVIL,ZESTRIL) 5 MG tablet Take 1 tablet (5 mg total) by mouth daily. (Patient not taking: Reported on 11/16/2015) 30 tablet 2 Not Taking at Unknown time  . potassium chloride SA (K-DUR,KLOR-CON) 20 MEQ tablet Take 2 tablets (40 mEq total) by mouth 2 (two) times daily. (Patient not taking: Reported on 11/16/2015) 120 tablet 2 Not Taking at Unknown time   Scheduled:  . ARIPiprazole  10 mg Oral QHS  . aspirin EC  81 mg Oral Daily  . atorvastatin  40 mg Oral q1800  . divalproex  500 mg Oral QHS  . furosemide  40 mg Oral BID  . lisinopril  5 mg Oral q morning - 10a  . metFORMIN  500 mg Oral BID WC  . metoprolol tartrate  12.5 mg Oral BID  . potassium chloride  20 mEq Oral BID  . ticagrelor  90 mg Oral BID    Assessment: 55 y.o obese female, weight = 168.8 kg, height 65  inches. Admitted on 11/16/15 PM with chest pain and SOB.  Pharmacy consulted to dose lovenox SQ for VTE prophylaxis.  Lovenox dose will be adjusted for patient's obesity (BMI >30).  BMI = 62 Her renal function is normal with SCr = 1.04, CrCl ~ 95 ml/min,  Hgb 12.9, pltc 187K.   Goal of Therapy:  Anti-Xa level 0.3-0.6 units/ml 4hrs after LMWH dose given Monitor platelets by anticoagulation protocol: Yes   Plan:  Lovenox 0.5 mg/kg SQ q24h = 85 mg SQ q24h  Follow up on CBC ~q3days.   Nicole Cella, RPh Clinical Pharmacist Pager: [phone] 11/17/2015,9:54 AM

## 2015-11-17 NOTE — Progress Notes (Signed)
CRITICAL VALUE ALERT  Critical value received:  Troponin 0.61  Date of notification:  11/17/2015  Time of notification:  0630  Critical value read back:Yes  Nurse who received alert:  Darrion Macaulay  MD notified (1st page):  Einar Gip  Time of first page:  0636  MD notified (2nd page)  Time of second page:  Responding MD:    Time MD responded:

## 2015-11-17 NOTE — ED Notes (Signed)
Cardiology at the bedside.

## 2015-11-17 NOTE — Progress Notes (Signed)
Dr. Einar Gip responding to critical troponin 0.61 on patient who is currently resting with no pain complaint and is in no distress,  acknowledged value, has not given any new orders, this RN will continue to monitor patient.

## 2015-11-17 NOTE — Progress Notes (Signed)
  Echocardiogram 2D Echocardiogram has been performed.  Jodi [name] 11/17/2015, 6:21 PM

## 2015-11-18 ENCOUNTER — Encounter (HOSPITAL_COMMUNITY): Payer: Self-pay | Admitting: Radiology

## 2015-11-18 ENCOUNTER — Inpatient Hospital Stay (HOSPITAL_COMMUNITY): Payer: Medicare Other

## 2015-11-18 DIAGNOSIS — I509 Heart failure, unspecified: Secondary | ICD-10-CM

## 2015-11-18 DIAGNOSIS — I5033 Acute on chronic diastolic (congestive) heart failure: Secondary | ICD-10-CM | POA: Diagnosis not present

## 2015-11-18 LAB — CBC
HCT: 48.2 % — ABNORMAL HIGH (ref 36.0–46.0)
HEMOGLOBIN: 12.8 g/dL (ref 12.0–15.0)
MCH: 23.4 pg — AB (ref 26.0–34.0)
MCHC: 26.6 g/dL — ABNORMAL LOW (ref 30.0–36.0)
MCV: 88.3 fL (ref 78.0–100.0)
Platelets: 185 10*3/uL (ref 150–400)
RBC: 5.46 MIL/uL — AB (ref 3.87–5.11)
RDW: 19.6 % — ABNORMAL HIGH (ref 11.5–15.5)
WBC: 9.8 10*3/uL (ref 4.0–10.5)

## 2015-11-18 LAB — GLUCOSE, CAPILLARY
GLUCOSE-CAPILLARY: 148 mg/dL — AB (ref 65–99)
GLUCOSE-CAPILLARY: 153 mg/dL — AB (ref 65–99)

## 2015-11-18 MED ORDER — SODIUM CHLORIDE 0.9 % IV SOLN
INTRAVENOUS | Status: DC
  Administered 2015-11-18: 14:00:00 via INTRAVENOUS

## 2015-11-18 MED ORDER — IOHEXOL 350 MG/ML SOLN
100.0000 mL | Freq: Once | INTRAVENOUS | Status: AC | PRN
  Administered 2015-11-18: 100 mL via INTRAVENOUS

## 2015-11-18 NOTE — Progress Notes (Signed)
This admission has been reviewed and determined not to meet inpatient level of care. Both attending Physician and Medical Director are in agreement this should be an Observation encounter according to the Medicare Conditions of Participation as set forth in CFR 42 Chapter 456 482.12 (c) and the Medicare Condition Code-44 Regulations CFR 42 Chapter 100 - 04 50.3. The Patient and/or Patient Representative was notified via delivery of the "MEDICARE OBSERVATION STATUS NOTIFICATION".              

## 2015-11-18 NOTE — Consult Note (Signed)
   [facility] CM Inpatient Consult   11/18/2015  Jodi [name] 1960-10-05 [id number]   Received referral from inpatient RNCM for [place] Management program services. Patient is not eligible for [facility] Care Management services at this time. Reason:  Not a beneficiary currently attributed to one of the [place].  Membership roster used to verify non- eligible status. Made inpatient RNCM aware. Appreciative of referral however.  Marthenia Rolling, MSN-Ed, RN,BSN [facility] Building Liaison [phone]

## 2015-11-18 NOTE — Progress Notes (Signed)
Utilization review completed.  

## 2015-11-18 NOTE — Progress Notes (Signed)
Subjective:  Patient nauseous, refusing all medications. States "I think these medications are a mistake, just get the fluid off of me and leave my heart alone."  Pt dry heaving into emesis basin, states symptoms began after eating apple sauce and drinking milk late last night. Sister at bedside. Daughter on the way to speak with Dr. Einar Gip.   Objective:  Vital Signs in the last 24 hours: Temp:  [98.1 F (36.7 C)-98.3 F (36.8 C)] 98.3 F (36.8 C) (03/02 0310) Pulse Rate:  [65-74] 65 (03/02 0310) Resp:  [19] 19 (03/02 0310) BP: (111-119)/(57-64) 119/64 mmHg (03/02 0310) SpO2:  [91 %-95 %] 95 % (03/02 0310)  Intake/Output from previous day: 03/01 0701 - 03/02 0700 In: 600 [P.O.:600] Out: 400 [Urine:400]  Physical Exam: General appearance: alert, appears stated age, mild distress and holding emesis basin, uncooperative, mumbling, and incoherent. Morbidly obese Eyes: negative Neck: no adenopathy, no carotid bruit, no JVD, supple, symmetrical, trachea midline and thyroid not enlarged, symmetric, no tenderness/mass/nodules Resp: clear to auscultation bilaterally and coughs when attempting to breath deep Chest wall: no tenderness Cardio: regular rate and rhythm, S1, S2 normal, systolic murmur: early systolic 3/6, at 2nd left intercostal space and 2nd right intercostal space and diastolic murmur: early diastolic 1/6, at 2nd left intercostal space GI: soft, non-tender; bowel sounds normal; no masses, no organomegaly and abdominal obesity Extremities: edema 2-3+ bilaterally Pulses: difficult to feel due to obesity Skin: Skin color, texture, turgor normal. No rashes or lesions Neurologic: Grossly normal    Lab Results: BMP  Recent Labs  09/16/15 0450 11/16/15 2210 11/17/15 0500  NA 139 142 144  K 3.2* 3.9 3.7  CL 90* 94* 95*  CO2 41* 36* 39*  GLUCOSE 153* 233* 158*  BUN 17 47* 45*  CREATININE 0.67 1.23* 1.04*  CALCIUM 8.7* 8.8* 8.7*  GFRNONAA >60 48* 59*  GFRAA >60 56* >60     CBC  Recent Labs Lab 11/18/15 0430  WBC 9.8  RBC 5.46*  HGB 12.8  HCT 48.2*  PLT 185  MCV 88.3  MCH 23.4*  MCHC 26.6*  RDW 19.6*    HEMOGLOBIN A1C Lab Results  Component Value Date   HGBA1C 6.8* 09/15/2015   MPG 148 09/15/2015    Cardiac Panel (last 3 results)  Recent Labs  11/17/15 1530 11/17/15 1702 11/17/15 2110  TROPONINI 0.44* 0.46* 0.42*    BNP (last 3 results) No results for input(s): PROBNP in the last 8760 hours.  TSH No results for input(s): TSH in the last 8760 hours.  CHOLESTEROL No results for input(s): CHOL in the last 8760 hours.  Hepatic Function Panel  Recent Labs  02/27/15 0616 09/15/15 1126  PROT 7.1 7.6  ALBUMIN 3.4* 3.6  AST 16 13*  ALT 18 25  ALKPHOS 63 69  BILITOT 0.4 0.8    Imaging: Dg Chest 2 View  11/16/2015  CLINICAL DATA:  Subacute onset of shortness of breath. Bilateral lower extremity swelling. Initial encounter. EXAM: CHEST  2 VIEW COMPARISON:  Chest radiograph performed 09/14/2015 FINDINGS: The lungs are well-aerated. Vascular congestion is noted, with mildly increased interstitial markings, raising question for mild interstitial edema. There is no evidence of focal opacification, pleural effusion or pneumothorax. The heart is enlarged.  No acute osseous abnormalities are seen. IMPRESSION: Vascular congestion and cardiomegaly, with mildly increased interstitial markings, raising question for mild interstitial edema. Electronically Signed   By: Garald Balding M.D.   On: 11/16/2015 23:11    Cardiac Studies: EKG 11/16/2015: Sinus  rhythm at a rate of 86 bpm, rightward axis, normal intervals, poor R wave progression, diffuse T-wave inversion, low voltage complexes. Compared to EKG on 09/15/2015, poor R-wave progression and T-wave inversion is new.  Echocardiogram 11/17/2015: results pending  Assessment/Plan:  1. Elevated troponin (has remained flat) 2. Abnormal EKG 3. Acute on chronic diastolic heart failure 4.  Benign essential hypertension 5. Type 2 Diabetes Mellitus 6. Mild hyperlipidemia (LDL on 12/18/2013: 105) 7. Chest pain, atypical  8. Dyspnea on exertion 9. Morbid obesity  Recommendation: Patient is refusing all of her medications.  Will not definitively explain why, however states that she thinks the medications were a mistake and she just wants [place] to get the fluid off of her and leave everything else alone.  Noncompliant with diet.  Again explained mechanism of condition and the purpose behind each medication.  She continues to assert that nothing is wrong with her heart and we just need to get the fluid off of her.  Daughter is on the way to discuss diagnosis and plan with Dr. Einar Gip.   Rachel Bo, NP-C 11/18/2015, 8:57 AM [facility]  Pager: [phone] Office: [phone]

## 2015-11-18 NOTE — Progress Notes (Signed)
Patient, with daughter at bedside, educated on new medications. Given handouts on each medication. Patient still requesting to speak with MD.

## 2015-11-18 NOTE — Care Management Obs Status (Signed)
[place] NOTIFICATION   Patient Details  Name: Jodi [name] MRN: [id number] Date of Birth: 07-07-1961   Medicare Observation Status Notification Given:  Yes (CC44 given to pt)    Dawayne Patricia, RN 11/18/2015, 4:44 PM

## 2015-11-19 DIAGNOSIS — I5033 Acute on chronic diastolic (congestive) heart failure: Secondary | ICD-10-CM | POA: Diagnosis not present

## 2015-11-19 LAB — GLUCOSE, CAPILLARY: GLUCOSE-CAPILLARY: 177 mg/dL — AB (ref 65–99)

## 2015-11-19 MED ORDER — METOPROLOL SUCCINATE ER 25 MG PO TB24: 25.0000 mg | ORAL_TABLET | Freq: Every day | ORAL | Status: DC

## 2015-11-19 MED ORDER — LISINOPRIL 5 MG PO TABS: 5.0000 mg | ORAL_TABLET | Freq: Every morning | ORAL | Status: DC

## 2015-11-19 MED ORDER — POTASSIUM CHLORIDE CRYS ER 20 MEQ PO TBCR: 20.0000 meq | EXTENDED_RELEASE_TABLET | Freq: Two times a day (BID) | ORAL | Status: DC

## 2015-11-19 MED ORDER — ASPIRIN 81 MG PO TBEC: 81.0000 mg | DELAYED_RELEASE_TABLET | Freq: Every day | ORAL | Status: DC

## 2015-11-19 NOTE — Discharge Summary (Signed)
Physician Discharge Summary  Patient ID: Jodi [name] MRN: [id number] DOB/AGE: August 30, 1961 55 y.o.  Admit date: 11/16/2015 Discharge date: 11/19/2015  Discharge Diagnoses: 1. Acute on chronic diastolic heart failure, elevated troponin due to CHF. 2. Moderate to severe pulmonary hypertension 3. Chronic cor pulmonale and Pickwickian syndrome. 4. Benign essential hypertension 5. Type 2 Diabetes Mellitus controlled not on insulin 6. Mild hyperlipidemia (LDL on 12/18/2013: 105) 7. Chest pain, atypical  8. Dyspnea on exertion 9. Morbid obesity 10. 4.1cm ascending thoracic aortic aneurysm.  Significant Diagnostic Studies: Echocardiogram 11/17/2015:  - Left ventricle: Poor echo window due to patient&'s bodily habitus. The cavity size was normal. Wall thickness was increased in a pattern of mild LVH. Systolic function was normal. The estimated ejection fraction was in the range of 55% to 60%. The study is not technically sufficient to allow evaluation of LV diastolic function. - Ventricular septum: The contour showed diastolic flattening andsystolic flattening. Suggests RV volume and pressure overload on LV. - Mitral valve: Calcified annulus. Mildly thickened leaflets .- Right ventricle: The cavity size was moderately to severely dilated. Systolic function was moderately reduced. - Right atrium: The atrium was severely dilated. - Moderate to severe pulmonary hypertension. Pulmonary arteries: PA peak pressure: 65 mm Hg (S). - Compared to the study done on 09/15/2015, although rightventricular dimension normal, personal evaluation reveals the right ventricle probably enlarged. Not well visualized. The present study is much better visualization. There was nosignificant tricuspid regurgitation previously to measure the PApressure. However grossly the systolic and diastolic septalflattening is clearly new.  CTA Chest 11/18/2015: Suboptimal contrast opacification of pulmonary  arteries is noted. No large central pulmonary embolus is noted, but smaller peripheral pulmonary emboli cannot be excluded on the basis of this study. Enlargement of the pulmonary arteries is noted suggesting pulmonary artery hypertension. Moderate cardiomegaly is noted. 4.1 cm ascending thoracic aortic aneurysm.  Hospital Course:  Jodi [name] is a 55 y.o. female with a history of Bipolar disorder, DM type 2 diabetes mellitus, HTN, chronic diastolic heart failure, morbid obesity, and history of poor compliance with medications who presented on 11/16/2015 with worsening edema, shortness of breath, and intermittent episodes of chest pain. She was recently seen on an outpatient basis 2 weeks ago to establish care for management on recent episode of acute on chronic diastolic heart failure. Medical therapy was adjusted, however, she admits to not taking medications as she should for fear of side effects. Last episode of chest pain was one day prior to arrival to hospital. No recurrence of pain since. She states pain typically occurs when laying flat, not necessarily with exertion, although she is markedly sedentary.   She was started on medical therapy for possible NSTEMI given positive troponin. Echocardiogram revealed mod to severe pulmonary hypertension with RV strain which was new from echo on 09/15/2015. CT scan was performed to exclude PE due to flat troponin and new echo finding of RV strain. Pt has been refusing all medications despite multiple conversations with the patient and her family regarding her diagnosis and prognosis.  Recommendations on discharge: Again had a very lengthy discussion with the patient and her sister regarding her diagnosis. Pt seems willing now to take her medications but unsure if she will continue compliance. Acute cor pulmonale likely due to morbid obesity and dietary indiscretion. Also discussed dietary modifications at length, both in regards to caloric restriction  and sodium restriction. Follow up outpatient for further evaluation and management.   60 minutes spent on discharge with 30-40  minutes of face to face encounter regarding her complex medical issues and adherence to medical therapy.  Discharge Exam: Blood pressure 109/56, pulse 72, temperature 98.3 F (36.8 C), temperature source Oral, resp. rate 20, height 5\' 5"  (1.651 m), weight 169.3 kg (373 lb 3.8 oz), SpO2 96 %.    General appearance: alert, appears stated age, cooperative. Morbidly obese Eyes: negative Neck: no adenopathy, no carotid bruit, no JVD, supple, symmetrical, trachea midline and thyroid not enlarged, symmetric, no tenderness/mass/nodules Resp: clear to auscultation bilaterally Chest wall: no tenderness Cardio: regular rate and rhythm, S1, S2 normal, systolic murmur: early systolic 3/6, at 2nd left intercostal space and 2nd right intercostal space and diastolic murmur: early diastolic 1/6, at 2nd left intercostal space GI: soft, non-tender; bowel sounds normal; no masses, no organomegaly and abdominal obesity. Large pannus present. Obese.  Extremities: edema 2-3+ bilaterally Pulses: difficult to feel due to obesity Skin: Skin color, texture, turgor normal. No rashes or lesions Neurologic: Grossly normal  Labs:   Lab Results  Component Value Date   WBC 9.8 11/18/2015   HGB 12.8 11/18/2015   HCT 48.2* 11/18/2015   MCV 88.3 11/18/2015   PLT 185 11/18/2015    Recent Labs Lab 11/17/15 0500  NA 144  K 3.7  CL 95*  CO2 39*  BUN 45*  CREATININE 1.04*  CALCIUM 8.7*  GLUCOSE 158*    Lipid Panel     Component Value Date/Time   CHOL 173 12/18/2013 0709   TRIG 101 12/18/2013 0709   HDL 48 12/18/2013 0709   VLDL 20 12/18/2013 0709   LDLCALC 105* 12/18/2013 0709    BNP (last 3 results)  Recent Labs  09/14/15 2213 11/16/15 2351 11/17/15 0500  BNP 218.4* 559.7* 514.7*    HEMOGLOBIN A1C Lab Results  Component Value Date   HGBA1C 6.8* 09/15/2015   MPG  148 09/15/2015    Cardiac Panel (last 3 results)  Recent Labs  11/17/15 1530 11/17/15 1702 11/17/15 2110  TROPONINI 0.44* 0.46* 0.42*    Lab Results  Component Value Date   CKTOTAL 48 03/04/2014   CKMB 0.6 03/04/2014   TROPONINI 0.42* 11/17/2015     EKG 11/16/2015: Sinus rhythm at a rate of 86 bpm, rightward axis, normal intervals, poor R wave progression, diffuse T-wave inversion, low voltage complexes. Compared to EKG on 09/15/2015, poor R-wave progression and T-wave inversion is new.  Radiology: Dg Chest 2 View  11/16/2015  CLINICAL DATA:  Subacute onset of shortness of breath. Bilateral lower extremity swelling. Initial encounter. EXAM: CHEST  2 VIEW COMPARISON:  Chest radiograph performed 09/14/2015 FINDINGS: The lungs are well-aerated. Vascular congestion is noted, with mildly increased interstitial markings, raising question for mild interstitial edema. There is no evidence of focal opacification, pleural effusion or pneumothorax. The heart is enlarged.  No acute osseous abnormalities are seen. IMPRESSION: Vascular congestion and cardiomegaly, with mildly increased interstitial markings, raising question for mild interstitial edema. Electronically Signed   By: Garald Balding M.D.   On: 11/16/2015 23:11   Ct Angio Chest Pe W/cm &/or Wo Cm  11/18/2015  CLINICAL DATA:  Acute dyspnea.  Chest pain. EXAM: CT ANGIOGRAPHY CHEST WITH CONTRAST TECHNIQUE: Multidetector CT imaging of the chest was performed using the standard protocol during bolus administration of intravenous contrast. Multiplanar CT image reconstructions and MIPs were obtained to evaluate the vascular anatomy. CONTRAST:  11mL OMNIPAQUE IOHEXOL 350 MG/ML SOLN COMPARISON:  Chest radiograph of November 16, 2015. FINDINGS: Moderate cardiomegaly is noted. No pneumothorax or pleural  effusion is noted. Minimal left lower lobe subsegmental atelectasis is noted. Suboptimal enhancement of the pulmonary arteries is noted. There is no  definite evidence of large central pulmonary embolus, but smaller peripheral emboli cannot be excluded on the basis of this exam. 4.1 cm ascending thoracic aortic aneurysm is noted. Enlargement of the pulmonary arteries is also noted suggesting pulmonary hypertension. No mediastinal mass or adenopathy is noted. Visualized portion of upper abdomen is unremarkable. No significant osseous abnormality is noted. Review of the MIP images confirms the above findings. IMPRESSION: Suboptimal contrast opacification of pulmonary arteries is noted. No large central pulmonary embolus is noted, but smaller peripheral pulmonary emboli cannot be excluded on the basis of this study. Enlargement of the pulmonary arteries is noted suggesting pulmonary artery hypertension. Moderate cardiomegaly is noted. 4.1 cm ascending thoracic aortic aneurysm. Recommend annual imaging followup by CTA or MRA. This recommendation follows 2010 ACCF/AHA/AATS/ACR/ASA/SCA/SCAI/SIR/STS/SVM Guidelines for the Diagnosis and Management of Patients with Thoracic Aortic Disease. Circulation. 2010; 121SP:1689793. Electronically Signed   By: Marijo Conception, M.D.   On: 11/18/2015 15:20   FOLLOW UP PLANS AND APPOINTMENTS    Medication List    TAKE these medications        acetaminophen 500 MG tablet  Commonly known as:  TYLENOL  Take 500 mg by mouth every 6 (six) hours as needed for mild pain.     ARIPiprazole 15 MG tablet  Commonly known as:  ABILIFY  Take 15 mg by mouth at bedtime.     aspirin 81 MG EC tablet  Take 1 tablet (81 mg total) by mouth daily.     divalproex 500 MG DR tablet  Commonly known as:  DEPAKOTE  Take 500 mg by mouth at bedtime.     furosemide 40 MG tablet  Commonly known as:  LASIX  Take 2 tablets (80 mg total) by mouth 2 (two) times daily.     lisinopril 5 MG tablet  Commonly known as:  PRINIVIL,ZESTRIL  Take 1 tablet (5 mg total) by mouth every morning.     metFORMIN 500 MG tablet  Commonly known as:   GLUCOPHAGE  Take 500 mg by mouth 2 (two) times daily with a meal.     metoprolol succinate 25 MG 24 hr tablet  Commonly known as:  TOPROL XL  Take 1 tablet (25 mg total) by mouth daily.     potassium chloride SA 20 MEQ tablet  Commonly known as:  K-DUR,KLOR-CON  Take 1 tablet (20 mEq total) by mouth 2 (two) times daily.        Rachel Bo, NP-C 11/19/2015, 9:17 AM  Pager: [phone] Office: [phone] If no answer: [phone]  I have personally reviewed the patient's record and performed physical exam and agree with the assessment and plan of Ms. Neldon Labella, NP-C.  Adrian Prows, MD 11/19/2015, 9:57 AM [facility] [place] Pager: [phone] Office: [phone] If no answer: Cell:  [phone]

## 2015-12-03 ENCOUNTER — Inpatient Hospital Stay
Admission: EM | Admit: 2015-12-03 | Discharge: 2015-12-09 | DRG: 291 | Disposition: A | Discharge status: Home Health | Payer: Medicare Other | Attending: Internal Medicine | Admitting: Internal Medicine

## 2015-12-03 ENCOUNTER — Encounter: Payer: Self-pay | Admitting: Emergency Medicine

## 2015-12-03 ENCOUNTER — Emergency Department: Payer: Medicare Other

## 2015-12-03 DIAGNOSIS — Z833 Family history of diabetes mellitus: Secondary | ICD-10-CM

## 2015-12-03 DIAGNOSIS — Z9981 Dependence on supplemental oxygen: Secondary | ICD-10-CM

## 2015-12-03 DIAGNOSIS — J9601 Acute respiratory failure with hypoxia: Secondary | ICD-10-CM | POA: Insufficient documentation

## 2015-12-03 DIAGNOSIS — Z9114 Patient's other noncompliance with medication regimen: Secondary | ICD-10-CM | POA: Diagnosis not present

## 2015-12-03 DIAGNOSIS — I2609 Other pulmonary embolism with acute cor pulmonale: Secondary | ICD-10-CM | POA: Diagnosis not present

## 2015-12-03 DIAGNOSIS — Z79899 Other long term (current) drug therapy: Secondary | ICD-10-CM

## 2015-12-03 DIAGNOSIS — E662 Morbid (severe) obesity with alveolar hypoventilation: Secondary | ICD-10-CM | POA: Diagnosis present

## 2015-12-03 DIAGNOSIS — E119 Type 2 diabetes mellitus without complications: Secondary | ICD-10-CM | POA: Diagnosis present

## 2015-12-03 DIAGNOSIS — Z6841 Body Mass Index (BMI) 40.0 and over, adult: Secondary | ICD-10-CM | POA: Diagnosis not present

## 2015-12-03 DIAGNOSIS — E669 Obesity, unspecified: Secondary | ICD-10-CM | POA: Insufficient documentation

## 2015-12-03 DIAGNOSIS — I272 Other secondary pulmonary hypertension: Secondary | ICD-10-CM | POA: Diagnosis present

## 2015-12-03 DIAGNOSIS — Z87891 Personal history of nicotine dependence: Secondary | ICD-10-CM

## 2015-12-03 DIAGNOSIS — F3173 Bipolar disorder, in partial remission, most recent episode manic: Secondary | ICD-10-CM | POA: Insufficient documentation

## 2015-12-03 DIAGNOSIS — I110 Hypertensive heart disease with heart failure: Principal | ICD-10-CM | POA: Diagnosis present

## 2015-12-03 DIAGNOSIS — F319 Bipolar disorder, unspecified: Secondary | ICD-10-CM | POA: Diagnosis present

## 2015-12-03 DIAGNOSIS — R609 Edema, unspecified: Secondary | ICD-10-CM

## 2015-12-03 DIAGNOSIS — N179 Acute kidney failure, unspecified: Secondary | ICD-10-CM | POA: Diagnosis present

## 2015-12-03 DIAGNOSIS — Z91199 Patient's noncompliance with other medical treatment and regimen due to unspecified reason: Secondary | ICD-10-CM

## 2015-12-03 DIAGNOSIS — Z9119 Patient's noncompliance with other medical treatment and regimen: Secondary | ICD-10-CM

## 2015-12-03 DIAGNOSIS — Z7984 Long term (current) use of oral hypoglycemic drugs: Secondary | ICD-10-CM

## 2015-12-03 DIAGNOSIS — I5033 Acute on chronic diastolic (congestive) heart failure: Secondary | ICD-10-CM | POA: Diagnosis present

## 2015-12-03 DIAGNOSIS — Z9111 Patient's noncompliance with dietary regimen: Secondary | ICD-10-CM

## 2015-12-03 DIAGNOSIS — E872 Acidosis: Secondary | ICD-10-CM | POA: Diagnosis present

## 2015-12-03 DIAGNOSIS — G4736 Sleep related hypoventilation in conditions classified elsewhere: Secondary | ICD-10-CM

## 2015-12-03 DIAGNOSIS — I712 Thoracic aortic aneurysm, without rupture: Secondary | ICD-10-CM | POA: Diagnosis present

## 2015-12-03 DIAGNOSIS — J9621 Acute and chronic respiratory failure with hypoxia: Secondary | ICD-10-CM | POA: Diagnosis present

## 2015-12-03 DIAGNOSIS — Z7982 Long term (current) use of aspirin: Secondary | ICD-10-CM | POA: Diagnosis not present

## 2015-12-03 DIAGNOSIS — E785 Hyperlipidemia, unspecified: Secondary | ICD-10-CM | POA: Diagnosis present

## 2015-12-03 DIAGNOSIS — J9622 Acute and chronic respiratory failure with hypercapnia: Secondary | ICD-10-CM | POA: Diagnosis present

## 2015-12-03 DIAGNOSIS — Z91128 Patient's intentional underdosing of medication regimen for other reason: Secondary | ICD-10-CM

## 2015-12-03 DIAGNOSIS — J9602 Acute respiratory failure with hypercapnia: Secondary | ICD-10-CM

## 2015-12-03 DIAGNOSIS — D72829 Elevated white blood cell count, unspecified: Secondary | ICD-10-CM | POA: Diagnosis present

## 2015-12-03 DIAGNOSIS — T501X6A Underdosing of loop [high-ceiling] diuretics, initial encounter: Secondary | ICD-10-CM | POA: Diagnosis present

## 2015-12-03 DIAGNOSIS — G9341 Metabolic encephalopathy: Secondary | ICD-10-CM | POA: Diagnosis present

## 2015-12-03 DIAGNOSIS — R0602 Shortness of breath: Secondary | ICD-10-CM | POA: Diagnosis not present

## 2015-12-03 HISTORY — DX: Pulmonary hypertension, unspecified: I27.20

## 2015-12-03 HISTORY — DX: Other chest pain: R07.89

## 2015-12-03 HISTORY — DX: Thoracic aortic aneurysm, without rupture: I71.2

## 2015-12-03 HISTORY — DX: Cor pulmonale (chronic): I27.81

## 2015-12-03 HISTORY — DX: Thoracic aortic aneurysm, without rupture, unspecified: I71.20

## 2015-12-03 HISTORY — DX: Morbid (severe) obesity with alveolar hypoventilation: E66.2

## 2015-12-03 HISTORY — DX: Hyperlipidemia, unspecified: E78.5

## 2015-12-03 LAB — CBC WITH DIFFERENTIAL/PLATELET
BASOS ABS: 0.1 10*3/uL (ref 0–0.1)
Eosinophils Absolute: 0.1 10*3/uL (ref 0–0.7)
Eosinophils Relative: 1 %
HEMATOCRIT: 44.8 % (ref 35.0–47.0)
HEMOGLOBIN: 12.8 g/dL (ref 12.0–16.0)
Lymphs Abs: 1.3 10*3/uL (ref 1.0–3.6)
MCH: 23.1 pg — ABNORMAL LOW (ref 26.0–34.0)
MCHC: 28.6 g/dL — ABNORMAL LOW (ref 32.0–36.0)
MCV: 80.9 fL (ref 80.0–100.0)
MONO ABS: 1.2 10*3/uL — AB (ref 0.2–0.9)
Monocytes Relative: 11 %
NEUTROS ABS: 8.6 10*3/uL — AB (ref 1.4–6.5)
Platelets: 169 10*3/uL (ref 150–440)
RBC: 5.54 MIL/uL — AB (ref 3.80–5.20)
RDW: 22.2 % — AB (ref 11.5–14.5)
WBC: 11.3 10*3/uL — ABNORMAL HIGH (ref 3.6–11.0)

## 2015-12-03 LAB — COMPREHENSIVE METABOLIC PANEL
ALBUMIN: 3.3 g/dL — AB (ref 3.5–5.0)
ALK PHOS: 59 U/L (ref 38–126)
ALT: 38 U/L (ref 14–54)
AST: 20 U/L (ref 15–41)
Anion gap: 7 (ref 5–15)
BILIRUBIN TOTAL: 0.6 mg/dL (ref 0.3–1.2)
BUN: 61 mg/dL — AB (ref 6–20)
CALCIUM: 8.6 mg/dL — AB (ref 8.9–10.3)
CO2: 34 mmol/L — ABNORMAL HIGH (ref 22–32)
CREATININE: 1.33 mg/dL — AB (ref 0.44–1.00)
Chloride: 97 mmol/L — ABNORMAL LOW (ref 101–111)
GFR calc Af Amer: 51 mL/min — ABNORMAL LOW (ref >60)
GFR, EST NON AFRICAN AMERICAN: 44 mL/min — AB (ref >60)
GLUCOSE: 134 mg/dL — AB (ref 65–99)
Potassium: 4.2 mmol/L (ref 3.5–5.1)
Sodium: 138 mmol/L (ref 135–145)
TOTAL PROTEIN: 6.6 g/dL (ref 6.5–8.1)

## 2015-12-03 LAB — BRAIN NATRIURETIC PEPTIDE: B Natriuretic Peptide: 813 pg/mL — ABNORMAL HIGH (ref 0.0–100.0)

## 2015-12-03 LAB — GLUCOSE, CAPILLARY: GLUCOSE-CAPILLARY: 123 mg/dL — AB (ref 65–99)

## 2015-12-03 LAB — TROPONIN I
TROPONIN I: 0.55 ng/mL — AB (ref <0.031)
TROPONIN I: 0.5 ng/mL — AB (ref <0.031)

## 2015-12-03 MED ORDER — ONDANSETRON HCL 4 MG PO TABS: 4.0000 mg | ORAL_TABLET | Freq: Four times a day (QID) | ORAL | Status: DC | PRN

## 2015-12-03 MED ORDER — METOPROLOL SUCCINATE ER 25 MG PO TB24
25.0000 mg | ORAL_TABLET | Freq: Every day | ORAL | Status: DC
  Administered 2015-12-04: 25 mg via ORAL
  Filled 2015-12-03 (×3): qty 1

## 2015-12-03 MED ORDER — DIVALPROEX SODIUM 250 MG PO DR TAB
500.0000 mg | DELAYED_RELEASE_TABLET | Freq: Every day | ORAL | Status: DC
  Administered 2015-12-03 – 2015-12-08 (×5): 500 mg via ORAL
  Filled 2015-12-03: qty 1
  Filled 2015-12-03 (×4): qty 2

## 2015-12-03 MED ORDER — INSULIN ASPART 100 UNIT/ML ~~LOC~~ SOLN
0.0000 [IU] | Freq: Three times a day (TID) | SUBCUTANEOUS | Status: DC
  Filled 2015-12-03 (×2): qty 2

## 2015-12-03 MED ORDER — HEPARIN SODIUM (PORCINE) 5000 UNIT/ML IJ SOLN
5000.0000 [IU] | Freq: Three times a day (TID) | INTRAMUSCULAR | Status: DC
  Filled 2015-12-03 (×2): qty 1

## 2015-12-03 MED ORDER — ACETAMINOPHEN 325 MG PO TABS
650.0000 mg | ORAL_TABLET | Freq: Four times a day (QID) | ORAL | Status: DC | PRN
  Administered 2015-12-03 – 2015-12-04 (×2): 650 mg via ORAL
  Administered 2015-12-08: 325 mg via ORAL
  Filled 2015-12-03: qty 1
  Filled 2015-12-03 (×5): qty 2

## 2015-12-03 MED ORDER — IPRATROPIUM-ALBUTEROL 0.5-2.5 (3) MG/3ML IN SOLN: 3.0000 mL | RESPIRATORY_TRACT | Status: DC | PRN

## 2015-12-03 MED ORDER — SODIUM CHLORIDE 0.9% FLUSH
3.0000 mL | Freq: Two times a day (BID) | INTRAVENOUS | Status: DC
  Administered 2015-12-03 – 2015-12-09 (×12): 3 mL via INTRAVENOUS

## 2015-12-03 MED ORDER — ARIPIPRAZOLE 15 MG PO TABS
15.0000 mg | ORAL_TABLET | Freq: Every day | ORAL | Status: DC
  Administered 2015-12-03 – 2015-12-08 (×5): 15 mg via ORAL
  Filled 2015-12-03: qty 1
  Filled 2015-12-03: qty 3
  Filled 2015-12-03 (×2): qty 1
  Filled 2015-12-03 (×2): qty 3

## 2015-12-03 MED ORDER — FUROSEMIDE 10 MG/ML IJ SOLN
20.0000 mg | Freq: Once | INTRAMUSCULAR | Status: AC
Start: 2015-12-03 — End: 2015-12-03
  Administered 2015-12-03: 20 mg via INTRAVENOUS
  Filled 2015-12-03: qty 4

## 2015-12-03 MED ORDER — ASPIRIN EC 81 MG PO TBEC
81.0000 mg | DELAYED_RELEASE_TABLET | Freq: Every day | ORAL | Status: DC
  Administered 2015-12-04: 81 mg via ORAL
  Filled 2015-12-03 (×5): qty 1

## 2015-12-03 MED ORDER — LISINOPRIL 5 MG PO TABS: 5.0000 mg | ORAL_TABLET | Freq: Every morning | ORAL | Status: DC

## 2015-12-03 MED ORDER — MORPHINE SULFATE (PF) 2 MG/ML IV SOLN
2.0000 mg | INTRAVENOUS | Status: DC | PRN
  Filled 2015-12-03: qty 1

## 2015-12-03 MED ORDER — INSULIN ASPART 100 UNIT/ML ~~LOC~~ SOLN: 0.0000 [IU] | Freq: Every day | SUBCUTANEOUS | Status: DC

## 2015-12-03 MED ORDER — POTASSIUM CHLORIDE CRYS ER 20 MEQ PO TBCR
20.0000 meq | EXTENDED_RELEASE_TABLET | Freq: Two times a day (BID) | ORAL | Status: DC
  Administered 2015-12-03 – 2015-12-09 (×11): 20 meq via ORAL
  Filled 2015-12-03 (×11): qty 1

## 2015-12-03 MED ORDER — ACETAMINOPHEN 650 MG RE SUPP: 650.0000 mg | Freq: Four times a day (QID) | RECTAL | Status: DC | PRN

## 2015-12-03 MED ORDER — NITROGLYCERIN 2 % TD OINT
1.0000 [in_us] | TOPICAL_OINTMENT | Freq: Once | TRANSDERMAL | Status: AC
  Administered 2015-12-03: 1 [in_us] via TOPICAL
  Filled 2015-12-03: qty 1

## 2015-12-03 MED ORDER — OXYCODONE HCL 5 MG PO TABS
5.0000 mg | ORAL_TABLET | ORAL | Status: DC | PRN
  Filled 2015-12-03: qty 1

## 2015-12-03 MED ORDER — ONDANSETRON HCL 4 MG/2ML IJ SOLN
4.0000 mg | Freq: Four times a day (QID) | INTRAMUSCULAR | Status: DC | PRN
Start: 2015-12-03 — End: 2015-12-09

## 2015-12-03 MED ORDER — FUROSEMIDE 10 MG/ML IJ SOLN
80.0000 mg | Freq: Two times a day (BID) | INTRAMUSCULAR | Status: DC
  Administered 2015-12-03: 80 mg via INTRAVENOUS
  Filled 2015-12-03 (×2): qty 8

## 2015-12-03 NOTE — H&P (Signed)
[facility] NAME: Jodi [name]    MR#:  [id number]  DATE OF BIRTH:  May 03, 1961   DATE OF ADMISSION:  12/03/2015  PRIMARY CARE PHYSICIAN: Tomasita Morrow, MD   REQUESTING/REFERRING PHYSICIAN: Marcelene Butte  CHIEF COMPLAINT:   Chief Complaint  Patient presents with  . Shortness of Breath  Swelling  HISTORY OF PRESENT ILLNESS:  Jodi [name]  is a 55 y.o. female with a known history of Diastolic congestive heart failure presenting with shortness of breath and lower extremity edema. Her symptoms have been progressive since recent discharge from [facility] impression [facility] week ago. She admits to medical and dietary noncompliance stated that she took her Lasix as needed and never actually picked up her medications from prior discharge. She is also noncompliant with oxygen and she is mostly on 2 L nasal cannula Baseline which she states "I usually don't need that." Regardless, she is presenting with worsening shortness of breath dyspnea on exertion and lower extremity edema or orthopnea denies any chest pain fevers chills or further symptomatology. On arrival to emergency department noted to be saturating 60% room air  She admits to about 20-25 pound weight gain  PAST MEDICAL HISTORY:   Past Medical History  Diagnosis Date  . Bipolar disorder ([place])   . Chronic back pain   . Chronic knee pain   . Morbid obesity ([place])   . Obesity hypoventilation syndrome ([place])   . Depression   . Diabetes mellitus     Type II  . CHF (congestive heart failure) ([place])     PAST SURGICAL HISTORY:   Past Surgical History  Procedure Laterality Date  . Right oophorectomy      SOCIAL HISTORY:   Social History  Substance Use Topics  . Smoking status: Former Research scientist (life sciences)  . Smokeless tobacco: Not on file  . Alcohol Use: 0.0 oz/week    0 Standard drinks or equivalent per week    FAMILY HISTORY:   Family History  Problem Relation Age of Onset   . Diabetes Other     DRUG ALLERGIES:   Allergies  Allergen Reactions  . Penicillins Other (See Comments)    Reaction:  Unknown     REVIEW OF SYSTEMS:  REVIEW OF SYSTEMS:  CONSTITUTIONAL: Denies fevers, chills, Positive fatigue, weakness.  EYES: Denies blurred vision, double vision, or eye pain.  EARS, NOSE, THROAT: Denies tinnitus, ear pain, hearing loss.  RESPIRATORY: denies cough, positive shortness of breath, denies wheezing  CARDIOVASCULAR: Denies chest pain, palpitations, positive edema.  GASTROINTESTINAL: Denies nausea, vomiting, diarrhea, abdominal pain.  GENITOURINARY: Denies dysuria, hematuria.  ENDOCRINE: Denies nocturia or thyroid problems. HEMATOLOGIC AND LYMPHATIC: Denies easy bruising or bleeding.  SKIN: Denies rash or lesions.  MUSCULOSKELETAL: Denies pain in neck, back, shoulder, knees, hips, or further arthritic symptoms.  NEUROLOGIC: Denies paralysis, paresthesias.  PSYCHIATRIC: Denies anxiety or depressive symptoms. Otherwise full review of systems performed by me is negative.   MEDICATIONS AT HOME:   Prior to Admission medications   Medication Sig Start Date End Date Taking? Authorizing Provider  ARIPiprazole (ABILIFY) 15 MG tablet Take 15 mg by mouth at bedtime.   Yes Historical Provider, MD  aspirin EC 81 MG EC tablet Take 1 tablet (81 mg total) by mouth daily. 11/19/15  Yes Adrian Prows, MD  divalproex (DEPAKOTE) 500 MG DR tablet Take 500 mg by mouth at bedtime.   Yes Historical Provider, MD  furosemide (LASIX) 40 MG tablet Take 2 tablets (  80 mg total) by mouth 2 (two) times daily. 09/16/15  Yes Venetia Maxon Rama, MD  ibuprofen (ADVIL,MOTRIN) 200 MG tablet Take 400 mg by mouth every 6 (six) hours as needed for headache or mild pain.   Yes Historical Provider, MD  metFORMIN (GLUCOPHAGE) 500 MG tablet Take 500 mg by mouth 2 (two) times daily with a meal.   Yes Historical Provider, MD  potassium chloride SA (K-DUR,KLOR-CON) 20 MEQ tablet Take 1 tablet (20 mEq  total) by mouth 2 (two) times daily. 11/19/15  Yes Adrian Prows, MD  lisinopril (PRINIVIL,ZESTRIL) 5 MG tablet Take 1 tablet (5 mg total) by mouth every morning. 11/19/15   Adrian Prows, MD  metoprolol succinate (TOPROL XL) 25 MG 24 hr tablet Take 1 tablet (25 mg total) by mouth daily. 11/19/15   Adrian Prows, MD      VITAL SIGNS:  Blood pressure 101/57, pulse 69, temperature 98.3 F (36.8 C), temperature source Oral, resp. rate 15, height 5\' 4"  (1.626 m), weight 375 lb (170.099 kg), SpO2 98 %.  PHYSICAL EXAMINATION:  VITAL SIGNS: Filed Vitals:   12/03/15 1914 12/03/15 2000  BP: 111/69 101/57  Pulse: 72 69  Temp:    Resp: 18 15   GENERAL:54 y.o.female currently in Moderate acute distress. Given respiratory status HEAD: Normocephalic, atraumatic.  EYES: Pupils equal, round, reactive to light. Extraocular muscles intact. No scleral icterus.  MOUTH: Moist mucosal membrane. Dentition intact. No abscess noted.  EAR, NOSE, THROAT: Clear without exudates. No external lesions.  NECK: Supple. No thyromegaly. No nodules. No JVD.  PULMONARY: Greatly diminished without wheeze rails or rhonci. No use of accessory muscles, poor respiratory effort. Poor air entry bilaterally CHEST: Nontender to palpation.  CARDIOVASCULAR: S1 and S2. Regular rate and rhythm. No murmurs, rubs, or gallops. Anasarca. Pedal pulses 2+ bilaterally.  GASTROINTESTINAL: Obese, Soft, nontender, nondistended. No masses. Positive bowel sounds. No hepatosplenomegaly.  MUSCULOSKELETAL: No swelling, clubbing, or edema. Range of motion full in all extremities.  NEUROLOGIC: Cranial nerves II through XII are intact. No gross focal neurological deficits. Sensation intact. Reflexes intact.  SKIN: No ulceration, lesions, rashes, or cyanosis. Skin warm and dry. Turgor intact.  PSYCHIATRIC: Mood, affect within normal limits. The patient is awake, alert and oriented x 3. Insight, judgment intact.    LABORATORY PANEL:   CBC  Recent Labs Lab  12/03/15 1802  WBC 11.3*  HGB 12.8  HCT 44.8  PLT 169   ------------------------------------------------------------------------------------------------------------------  Chemistries   Recent Labs Lab 12/03/15 1802  NA 138  K 4.2  CL 97*  CO2 34*  GLUCOSE 134*  BUN 61*  CREATININE 1.33*  CALCIUM 8.6*  AST 20  ALT 38  ALKPHOS 59  BILITOT 0.6   ------------------------------------------------------------------------------------------------------------------  Cardiac Enzymes  Recent Labs Lab 12/03/15 1802  TROPONINI 0.55*   ------------------------------------------------------------------------------------------------------------------  RADIOLOGY:  Dg Chest 2 View  12/03/2015  CLINICAL DATA:  Shortness of breath for 1 day EXAM: CHEST  2 VIEW COMPARISON:  Chest radiograph November 16, 2015 and chest CT November 18, 2015 FINDINGS: There is no edema or consolidation. Heart is enlarged with mild pulmonary venous hypertension. No demonstrable adenopathy. No bone lesions. IMPRESSION: Evidence of a degree of pulmonary vascular congestion without frank edema or consolidation. Electronically Signed   By: Lowella Grip III M.D.   On: 12/03/2015 19:01    EKG:   Orders placed or performed during the hospital encounter of 12/03/15  . EKG 12-Lead  . EKG 12-Lead  . EKG 12-Lead  . EKG  12-Lead  . EKG 12-Lead  . EKG 12-Lead    IMPRESSION AND PLAN:   55 year old African-American female history of diastolic congestive heart failure presenting with shortness of breath and edema of  1. Acute on chronic diastolic congestive heart failure: Aggressive IV diuresis daily weights and ins and outs consult cardiology follow BMP and potassium level supplemental oxygen as required given recent echocardiogram no indication order one now. Beta blocker and ACE inhibitor as tolerated-patient does admit to medical noncompliance as well as dietary noncompliance 2. Acute on chronic respiratory  failure with hypoxia Secondary to above continue supplemental oxygen with 2 L nasal Baseline okay keep O2 saturation greater than 88 percent 3. Type II Diabetes non-insulin-requiring: Hold oral agents at insulin sliding scale 4. Acute kidney injury: Continue diuresis ideally this will increase cardiac output increasing renal function. Apparently when her renal function and urine output 5. Venous embolism prophylactic: Heparin subcutaneous    All the records are reviewed and case discussed with ED provider. Management plans discussed with the patient, family and they are in agreement.  CODE STATUS: Full  TOTAL TIME TAKING CARE OF THIS PATIENT: 45 minutes.    Hower,  Karenann Cai.D on 12/03/2015 at 8:26 PM  Between 7am to 6pm - Pager - [phone]  After 6pm: House Pager: - [facility]  Office  [phone]  CC: Primary care physician; Tomasita Morrow, MD

## 2015-12-03 NOTE — ED Notes (Signed)
TROPONIN 0.55, [place], RN and Dr Marcelene Butte contacted

## 2015-12-03 NOTE — ED Provider Notes (Signed)
Time Seen: Approximately 17 40 I have reviewed the triage notes  Chief Complaint: Shortness of Breath   History of Present Illness: Jodi [name] is a 55 y.o. female who has a long history of pulmonary hypertension and congestive heart failure, type 2 diabetes, morbid obesity. Patient states she's had increased shortness of breath over the last "" couple of days "". Patient normally is supposed to wear 2 L nasal cannula at home all the time but she does not do that during her day-to-day activities. Patient's pulse ox initially was in the 60s here on room air and was placed on a 4 L nasal cannula. She states upper 80% is normal for her. She denies any chest pain. She does have some increased swelling in both lower extremities. Past Medical History  Diagnosis Date  . Bipolar disorder ([place])   . Chronic back pain   . Chronic knee pain   . Morbid obesity ([place])   . Obesity hypoventilation syndrome ([place])   . Depression   . Diabetes mellitus     Type II  . CHF (congestive heart failure) [facility])     Patient Active Problem List   Diagnosis Date Noted  . CHF (congestive heart failure) ([place]) 11/18/2015  . CHF exacerbation ([place]) 11/17/2015  . Acute on chronic diastolic CHF (congestive heart failure) ([place]) 09/16/2015  . Non compliance with medical treatment 09/16/2015  . Hypokalemia 09/16/2015  . Bipolar disorder ([place]) 09/15/2015  . Type 2 diabetes mellitus ([place]) 09/15/2015  . Morbid obesity ([place]) 09/15/2015    Past Surgical History  Procedure Laterality Date  . Right oophorectomy      Past Surgical History  Procedure Laterality Date  . Right oophorectomy      Current Outpatient Rx  Name  Route  Sig  Dispense  Refill  . acetaminophen (TYLENOL) 500 MG tablet   Oral   Take 500 mg by mouth every 6 (six) hours as needed for mild pain.         . ARIPiprazole (ABILIFY) 15 MG tablet   Oral   Take 15 mg by mouth at bedtime.         [name] aspirin EC 81 MG EC tablet   Oral   Take  1 tablet (81 mg total) by mouth daily.         . divalproex (DEPAKOTE) 500 MG DR tablet   Oral   Take 500 mg by mouth at bedtime.         . furosemide (LASIX) 40 MG tablet   Oral   Take 2 tablets (80 mg total) by mouth 2 (two) times daily.   120 tablet   2   . lisinopril (PRINIVIL,ZESTRIL) 5 MG tablet   Oral   Take 1 tablet (5 mg total) by mouth every morning.   30 tablet   1   . metFORMIN (GLUCOPHAGE) 500 MG tablet   Oral   Take 500 mg by mouth 2 (two) times daily with a meal.         . metoprolol succinate (TOPROL XL) 25 MG 24 hr tablet   Oral   Take 1 tablet (25 mg total) by mouth daily.   30 tablet   1   . potassium chloride SA (K-DUR,KLOR-CON) 20 MEQ tablet   Oral   Take 1 tablet (20 mEq total) by mouth 2 (two) times daily.   30 tablet   1     Allergies:  Penicillins  Family History: History reviewed. No pertinent  family history.  Social History: Social History  Substance Use Topics  . Smoking status: Former Research scientist (life sciences)  . Smokeless tobacco: None  . Alcohol Use: 0.0 oz/week    0 Standard drinks or equivalent per week     Review of Systems:   10 point review of systems was performed and was otherwise negative:  Constitutional: No fever Eyes: No visual disturbances ENT: No sore throat, ear pain Cardiac: No chest pain Respiratory:Shortness of breath exacerbated by lying flat and any activity. Abdomen: No abdominal pain, no vomiting, No diarrhea Endocrine: No weight loss, No night sweats Extremities: Increased swelling in both lower extremity more so than her baseline. Skin: No rashes, easy bruising Neurologic: No focal weakness, trouble with speech or swollowing Urologic: No dysuria, Hematuria, or urinary frequency   Physical Exam:  ED Triage Vitals  Enc Vitals Group     BP 12/03/15 1740 118/60 mmHg     Pulse Rate 12/03/15 1740 75     Resp 12/03/15 1740 24     Temp 12/03/15 1740 98.3 F (36.8 C)     Temp Source 12/03/15 1740 Oral      SpO2 12/03/15 1735 64 %     Weight 12/03/15 1740 375 lb (170.099 kg)     Height 12/03/15 1740 5\' 4"  (1.626 m)     Head Cir --      Peak Flow --      Pain Score 12/03/15 1801 0     Pain Loc --      Pain Edu? --      Excl. in [place]? --     General: Awake , Alert , and Oriented times 3; GCS 15Speaks in interrupted sentences Head: Normal cephalic , atraumatic Eyes: Pupils equal , round, reactive to light Nose/Throat: No nasal drainage, patent upper airway without erythema or exudate.  Neck: Supple, Full range of motion, No anterior adenopathy or palpable thyroid masses Lungs: Clear to ascultation without wheezes , rhonchi, or rales Heart: Regular rate, regular rhythm without murmurs , gallops , or rubs Abdomen: Morbidly obese Soft, non tender without rebound, guarding , or rigidity; bowel sounds positive and symmetric in all 4 quadrants. No organomegaly .        Extremities: Circumferential swelling in both lower extremities symmetric nonpitting Neurologic: normal ambulation, Motor symmetric without deficits, sensory intact Skin: warm, dry, no rashes   Labs:   All laboratory work was reviewed including any pertinent negatives or positives listed below:  Labs Reviewed  CBC WITH DIFFERENTIAL/PLATELET  COMPREHENSIVE METABOLIC PANEL  TROPONIN I  BRAIN NATRIURETIC PEPTIDE  Reviewed the patient's laboratory work shows some mild renal insufficiency for the patient along with a consistently elevated troponin.  EKG:  ED ECG REPORT I, Daymon Larsen, the attending physician, personally viewed and interpreted this ECG.  Date: 12/03/2015 EKG Time: 1744 Rate: *82 Rhythm: normal sinus rhythm QRS Axis: Rightward axis deviation Intervals: normal ST/T Wave abnormalities: Nonspecific ST-T wave abnormalities Conduction Disturbances: none Narrative Interpretation: unremarkable Low-voltage QRS indicating pulmonary disease No acute ischemic changes are noted  Radiology: *       Final  result by Rad Results In Interface (12/03/15 19:01:31)   Narrative:   CLINICAL DATA: Shortness of breath for 1 day  EXAM: CHEST 2 VIEW  COMPARISON: Chest radiograph November 16, 2015 and chest CT November 18, 2015  FINDINGS: There is no edema or consolidation. Heart is enlarged with mild pulmonary venous hypertension. No demonstrable adenopathy. No bone lesions.  IMPRESSION: Evidence of a  degree of pulmonary vascular congestion without frank edema or consolidation.   Electronically Signed    I personally reviewed the radiologic studies     ED Course:  Patient's stay here was uneventful remained hemodynamically stable. She was given an inch of nitroglycerin paste and some IV Lasix. The patient remains at times hypoxic . She was continued on supplemental oxygen therapy urine does appear to have some mild pulmonary edema per pulmonary hypertension.   Assessment: Pulmonary hypertension Hypoxia    Plan:  Inpatient           Daymon Larsen, MD 12/03/15 1958

## 2015-12-03 NOTE — ED Notes (Signed)
Patient transported to X-ray 

## 2015-12-03 NOTE — ED Notes (Signed)
Pt has been [facility] and feeling "swollen" at home for a couple days. Supposed to wear 2 L [place] all the time but does not wear it when she thinks she doesn't need it.  sats initially in 32s in triage.  Placed on 4 L [place] and sats 89-92.  Pt reports upper 80s is normal for her.

## 2015-12-04 ENCOUNTER — Encounter: Payer: Self-pay | Admitting: Internal Medicine

## 2015-12-04 DIAGNOSIS — I5033 Acute on chronic diastolic (congestive) heart failure: Secondary | ICD-10-CM

## 2015-12-04 DIAGNOSIS — R0602 Shortness of breath: Secondary | ICD-10-CM

## 2015-12-04 DIAGNOSIS — I272 Other secondary pulmonary hypertension: Secondary | ICD-10-CM

## 2015-12-04 DIAGNOSIS — J9602 Acute respiratory failure with hypercapnia: Secondary | ICD-10-CM

## 2015-12-04 DIAGNOSIS — J9601 Acute respiratory failure with hypoxia: Secondary | ICD-10-CM

## 2015-12-04 DIAGNOSIS — G4736 Sleep related hypoventilation in conditions classified elsewhere: Secondary | ICD-10-CM

## 2015-12-04 DIAGNOSIS — E669 Obesity, unspecified: Secondary | ICD-10-CM

## 2015-12-04 DIAGNOSIS — I2609 Other pulmonary embolism with acute cor pulmonale: Secondary | ICD-10-CM

## 2015-12-04 DIAGNOSIS — E662 Morbid (severe) obesity with alveolar hypoventilation: Secondary | ICD-10-CM

## 2015-12-04 LAB — BASIC METABOLIC PANEL
ANION GAP: 7 (ref 5–15)
BUN: 58 mg/dL — ABNORMAL HIGH (ref 6–20)
CHLORIDE: 98 mmol/L — AB (ref 101–111)
CO2: 34 mmol/L — AB (ref 22–32)
Calcium: 8.5 mg/dL — ABNORMAL LOW (ref 8.9–10.3)
Creatinine, Ser: 1.15 mg/dL — ABNORMAL HIGH (ref 0.44–1.00)
GFR calc non Af Amer: 53 mL/min — ABNORMAL LOW (ref >60)
GLUCOSE: 159 mg/dL — AB (ref 65–99)
Potassium: 4.4 mmol/L (ref 3.5–5.1)
Sodium: 139 mmol/L (ref 135–145)

## 2015-12-04 LAB — CBC
HCT: 42.9 % (ref 35.0–47.0)
HEMOGLOBIN: 12.3 g/dL (ref 12.0–16.0)
MCH: 23.4 pg — AB (ref 26.0–34.0)
MCHC: 28.5 g/dL — AB (ref 32.0–36.0)
MCV: 81.9 fL (ref 80.0–100.0)
Platelets: 155 10*3/uL (ref 150–440)
RBC: 5.24 MIL/uL — AB (ref 3.80–5.20)
RDW: 21.7 % — ABNORMAL HIGH (ref 11.5–14.5)
WBC: 9.2 10*3/uL (ref 3.6–11.0)

## 2015-12-04 LAB — GLUCOSE, CAPILLARY
GLUCOSE-CAPILLARY: 110 mg/dL — AB (ref 65–99)
GLUCOSE-CAPILLARY: 109 mg/dL — AB (ref 65–99)
Glucose-Capillary: 165 mg/dL — ABNORMAL HIGH (ref 65–99)
Glucose-Capillary: 205 mg/dL — ABNORMAL HIGH (ref 65–99)

## 2015-12-04 LAB — HEMOGLOBIN A1C: Hgb A1c MFr Bld: 7.7 % — ABNORMAL HIGH (ref 4.0–6.0)

## 2015-12-04 LAB — BLOOD GAS, ARTERIAL
Acid-Base Excess: 11.8 mmol/L — ABNORMAL HIGH (ref 0.0–3.0)
Bicarbonate: 42.3 mEq/L — ABNORMAL HIGH (ref 21.0–28.0)
FIO2: 32
O2 SAT: 87.5 %
PATIENT TEMPERATURE: 37
PO2 ART: 60 mmHg — AB (ref 83.0–108.0)
pCO2 arterial: 88 mmHg (ref 32.0–48.0)
pH, Arterial: 7.29 — ABNORMAL LOW (ref 7.350–7.450)

## 2015-12-04 LAB — MRSA PCR SCREENING: MRSA by PCR: NEGATIVE

## 2015-12-04 MED ORDER — HYDRALAZINE HCL 20 MG/ML IJ SOLN
10.0000 mg | INTRAMUSCULAR | Status: AC | PRN
Start: 2015-12-04 — End: 2015-12-09

## 2015-12-04 MED ORDER — FUROSEMIDE 10 MG/ML IJ SOLN
40.0000 mg | Freq: Two times a day (BID) | INTRAMUSCULAR | Status: DC
  Administered 2015-12-04 – 2015-12-06 (×5): 40 mg via INTRAVENOUS
  Filled 2015-12-04 (×4): qty 4

## 2015-12-04 NOTE — Progress Notes (Signed)
[facility] Progress Note Patient Name: Earlisha Sharif DOB: [birth year] MRN: [id number]   Date of Service  12/04/2015  HPI/Events of Note  Notified by respiratory therapist of repeat ABG. PH 7.33/PCO2 85/PO2 90s.FiO2 now 0.4. Continuing on BiPAP. Reportedly still wakes up easily and shows no evidence of fatigue.  eICU Interventions  1. Continue to wean FiO2 for saturation 90% 2. Continue BiPAP 3. Anesthesia and critical care aware of high potential for intubation     Intervention Category Major Interventions: Respiratory failure - evaluation and management  Tera Partridge 12/04/2015, 6:32 PM

## 2015-12-04 NOTE — Progress Notes (Signed)
34fr foley catheter placed, leg secure on.  Immediate return of clear yellow urine.  Pt tolerated well.

## 2015-12-04 NOTE — Progress Notes (Signed)
ABG collected, awaiting results.  Pt drowsy but wakes up, oriented to person and place.  O2 88-90 on 3LO2, BP 103/63.   Dr Ether Griffins in to see pt.

## 2015-12-04 NOTE — Progress Notes (Signed)
ICU Update ABG 7.30/88/101/97% (Bipap, 18/6, Rate =12) Essentially unchanged from earlier ABG. Son and daughter at the bedside, explained results to them and the need to adjust the bipap, or else patient will require mechanical ventilation. Patient and family request a short break off bipap, prior to changing settings.  Plan: 1. 15 mins break off bipap 2. Bipap 15/10 or 18/12, RR=15 or target Vt of 600 3. Recheck ABG at 1800 4. Bipap Qhs.    Vilinda Boehringer, MD [facility] Pager [phone] (please enter 7-digits) On Call Pager - [phone] (please enter 7-digits)

## 2015-12-04 NOTE — Progress Notes (Signed)
Spoke with Alvis in RT, ok to place pt on NRB mask while waiting for transfer to CCU

## 2015-12-04 NOTE — Progress Notes (Signed)
Pt more lethargic, Resp here to get ABG.

## 2015-12-04 NOTE — Progress Notes (Signed)
Patient has two runs of SVT and quickly came back to NSR. On re- assessment after the SVT, patient is asymptomatic and resting quietly with her eyes closed, respirations even and unlabored. Dr. Estanislado Pandy notified but no new order received but to continue to monitor.

## 2015-12-04 NOTE — Progress Notes (Signed)
Spoke to [employer], pts daughter and updated her on moms status and moving her to ICU.

## 2015-12-04 NOTE — Progress Notes (Signed)
Dr. Ether Griffins aware of ABG results, pt to be transferred to CCU with Bipap

## 2015-12-04 NOTE — Progress Notes (Signed)
[name] NAME: Jodi [name]    MR#:  [id number]  DATE OF BIRTH:  1961/01/13  SUBJECTIVE:  CHIEF COMPLAINT:   Chief Complaint  Patient presents with  . Shortness of Breath   Patient is 55 year old dominant female with medical history significant for history of affective sleep apnea/OHS, obesity, who presents to the hospital with complaints of shortness of breath and lower extremity swelling, which had been progressing over the past one week. Patient admitted of medical and dietary noncompliance including noncompliance with oxygen therapy. On arrival to emergency room, she was noted to have O2 sats in 60s on room air. She admitted of approximately 20-25 pound weight gain. On arrival to emergency room, she was given diuretics and diuresed, she admits of feeling better, however, not able to provide much more history. Since she is drooling and is obviously uncomfortable Review of Systems  Unable to perform ROS: critical illness    VITAL SIGNS: Blood pressure 120/72, pulse 66, temperature 97.8 F (36.6 C), temperature source Axillary, resp. rate 16, height 5\' 4"  (1.626 m), weight 174 kg (383 lb 9.6 oz), SpO2 98 %.  PHYSICAL EXAMINATION:   GENERAL:  55 y.o.-year-old patient lying in the bed , moderate to severe respiratory distress, poorly responsive to verbal stimuli, drooling, slurring speech, very  somnolent. Morbidly obese EYES: Pupils equal, round, reactive to light and accommodation. No scleral icterus. Extraocular muscles intact.  HEENT: Head atraumatic, normocephalic. Oropharynx and nasopharynx clear.  NECK:  Supple, no jugular venous distention. No thyroid enlargement, no tenderness.  LUNGS: Some diminished breath sounds bilaterally at bases, especially and posteriorly, no wheezing, but the diffuse anterior rales,rhonchi and crepitations noted. No use of accessory muscles of respiration, tachypneic, uncomfortable.   CARDIOVASCULAR: S1, S2 , tachycardic. No murmurs, rubs, or gallops, distant.  ABDOMEN: Soft, nontender, nondistended. Bowel sounds present. No organomegaly or mass.  EXTREMITIES: 3+ lower extremity and pedal edema, no cyanosis, or clubbing.  NEUROLOGIC: Cranial nerves II through XII are grossly intact., Although evaluation is severely limited due to patient's poor responsiveness.  Muscle strength 5/5 in all extremities. Sensation grossly intact. Gait not checked.  PSYCHIATRIC: The patient is alert and oriented x 3, able to answer a few questions appropriately.  SKIN: No obvious rash, lesion, or ulcer.   ORDERS/RESULTS REVIEWED:   CBC  Recent Labs Lab 12/03/15 1802 12/04/15 0500  WBC 11.3* 9.2  HGB 12.8 12.3  HCT 44.8 42.9  PLT 169 155  MCV 80.9 81.9  MCH 23.1* 23.4*  MCHC 28.6* 28.5*  RDW 22.2* 21.7*  LYMPHSABS 1.3  --   MONOABS 1.2*  --   EOSABS 0.1  --   BASOSABS 0.1  --    ------------------------------------------------------------------------------------------------------------------  Chemistries   Recent Labs Lab 12/03/15 1802 12/04/15 0500  NA 138 139  K 4.2 4.4  CL 97* 98*  CO2 34* 34*  GLUCOSE 134* 159*  BUN 61* 58*  CREATININE 1.33* 1.15*  CALCIUM 8.6* 8.5*  AST 20  --   ALT 38  --   ALKPHOS 59  --   BILITOT 0.6  --    ------------------------------------------------------------------------------------------------------------------ estimated creatinine clearance is 89.4 mL/min (by C-G formula based on Cr of 1.15). ------------------------------------------------------------------------------------------------------------------ No results for input(s): TSH, T4TOTAL, T3FREE, THYROIDAB in the last 72 hours.  Invalid input(s): FREET3  Cardiac Enzymes  Recent Labs Lab 12/03/15 1802 12/03/15 2147  TROPONINI 0.55* 0.50*   ------------------------------------------------------------------------------------------------------------------ Invalid  input(s): POCBNP ---------------------------------------------------------------------------------------------------------------  RADIOLOGY: Dg  Chest 2 View  12/03/2015  CLINICAL DATA:  Shortness of breath for 1 day EXAM: CHEST  2 VIEW COMPARISON:  Chest radiograph November 16, 2015 and chest CT November 18, 2015 FINDINGS: There is no edema or consolidation. Heart is enlarged with mild pulmonary venous hypertension. No demonstrable adenopathy. No bone lesions. IMPRESSION: Evidence of a degree of pulmonary vascular congestion without frank edema or consolidation. Electronically Signed   By: Lowella Grip III M.D.   On: 12/03/2015 19:01    EKG:  Orders placed or performed during the hospital encounter of 12/03/15  . EKG 12-Lead  . EKG 12-Lead  . EKG 12-Lead  . EKG 12-Lead  . EKG 12-Lead  . EKG 12-Lead    ASSESSMENT AND PLAN:  Principal Problem:   Acute on chronic diastolic (congestive) heart failure (HCC) Active Problems:   Pulmonary hypertension (HCC)   Acute respiratory failure with hypoxia and hypercarbia (HCC)   Morbid obesity due to excess calories (HCC)   Nocturnal hypoxemia due to obesity  #1. Acute on chronic respiratory failure with hypoxia and hypercapnia, transfer patient to intensive care unit for BiPAP administration, consult pulmonary #2 acute on chronic diastolic CHF, continue Lasix, watching patient's ins and outs, place Foley catheter in, observing intake and output #3 metabolic encephalopathy, due to hypercapnia, follow with therapy. #4. Renal insufficiency, decrease Lasix to 40 g twice daily dose, watch patient's ins and outs #5. Elevated troponin, continue heparin, adding aspirin, nitroglycerin, getting cardiologist involved for further recommendations #6. Leukocytosis, resolved. #7. Diabetes mellitus with hemoglobin A1c 7.7, hold metformin, continue sliding scale insulin, watching patient's kidney function closely Management plans discussed with the patient, family  and they are in agreement.   DRUG ALLERGIES:  Allergies  Allergen Reactions  . Penicillins Other (See Comments)    Reaction:  Unknown     CODE STATUS:     Code Status Orders        Start     Ordered   12/03/15 1948  Full code   Continuous     12/03/15 1948    Code Status History    Date Active Date Inactive Code Status Order ID Comments User Context   09/15/2015  4:29 AM 09/16/2015  3:45 PM Full Code [id number]  Reubin Milan, MD Inpatient      TOTAL CRITICAL CARE TIME TAKING CARE OF THIS PATIENT: 50 minutes.  Discussed with Dr. Roseanne Reno M.D on 12/04/2015 at 12:39 PM  Between 7am to 6pm - Pager - [phone]  After 6pm go to [email or web address] - password [email or web address] [facility]  [facility]  Office  [phone]  CC: Primary care physician; Tomasita Morrow, MD

## 2015-12-04 NOTE — Progress Notes (Signed)
ICU update Called to respond to concerns from patient and her daughter regarding NPO status. Patient wants to eat even though she is on BiPAP and has an elevated PCO2 of 88 that is refractory to BiPAP therapy. Patient also wants to get out of bed to chair even though she gets dyspneic and hypoxic with minimal activity. Dr. Stevenson Clinch had spoken at length this afternoon with patient's daughter and sons regarding the plan of care and explained the rationale for her NPO status.  Her work of breathing at rest, and mentation have improved but she still falls asleep easily. She is now on nasal cannula at 3 L. However, her 1800 ABG still shows significant CO2 retention-see ABG below.    Blood pressure 117/66, pulse 63, temperature 98.4 F (36.9 C), temperature source Axillary, resp. rate 24, height 5\' 4"  (1.626 m), weight 383 lb 9.6 oz (174 kg), SpO2 100 %.    ABG    Component Value Date/Time   PHART 7.33* 12/04/2015 1820   PCO2ART 85* 12/04/2015 1820   PO2ART 94 12/04/2015 1820   HCO3 44.8* 12/04/2015 1820   O2SAT 96.8 12/04/2015 1820    I have explained at length to patient, her daughter and her son why we are not giving her anything by mouth. She will require continuous BiPAP overnight. She is still at increased risk for intubation because she has not optimally responded to BiPAP. She is already a high risk intubation given her comorbidities. Eating and drinking will exacerbate this risk by  placing her at increased risk for aspiration.   Patient relies significantly on her children for her healthcare decisions. I have reviewed her current treatment plan with them. She will be placed on BiPAP overnight and we will repeat an ABG in the morning. They have all agreed that she will adhere to the current treatment plan. She remains a full code.  Dr. Stevenson Clinch updated  Arlyss Gandy. [facility] ANP-BC Pulmonary and Critical Care Medicine [facility] Pager [phone]

## 2015-12-04 NOTE — Consult Note (Signed)
CARDIOLOGY CONSULT NOTE     Primary Care Physician: Tomasita Morrow, MD Referring Physician:  Hospitalist Primary Cardiologist:  Dr Einar Gip  Admit Date: 12/03/2015  Reason for consultation:  Elevated troponin  Jodi [name] is a 55 y.o. female with a h/o multiple chronic comorbidities including morbid obesity, severe pulmonary hypertension, and cor pulmonale admitted with worsening SOB.   She was recenlyt discharged 11/19/15 after a hospitalization at [facility] 11/16/15 for similar symptoms.  She had chest CT and echo.  She was diuresed and discharged. Unfortunately, she was noncompliant with medical therapies and now returns with similar symptoms.  She reports progressive sob, weight gain, and worsening edema.  She has not been compliant with nocturnal O2 or diuresis.    Today, she denies symptoms of palpitations, chest pain, orthopnea, dizziness, presyncope, syncope, or neurologic sequela. The patient is tolerating medications without difficulties and is otherwise without complaint today.   Past Medical History  Diagnosis Date  . Bipolar disorder ([place])   . Chronic back pain   . Chronic knee pain   . Morbid obesity ([place])   . Obesity hypoventilation syndrome ([place])   . Depression   . Diabetes mellitus     Type II  . Moderate to severe pulmonary hypertension ([place])   . Pickwickian syndrome ([place])   . Cor pulmonale (chronic) ([place])   . Hypertension   . Hyperlipemia   . Thoracic aortic aneurysm ([place])   . Atypical chest pain    Past Surgical History  Procedure Laterality Date  . Right oophorectomy      . ARIPiprazole  15 mg Oral QHS  . aspirin EC  81 mg Oral Daily  . divalproex  500 mg Oral QHS  . furosemide  40 mg Intravenous BID  . heparin  5,000 Units Subcutaneous 3 times per day  . insulin aspart  0-5 Units Subcutaneous QHS  . insulin aspart  0-9 Units Subcutaneous TID WC  . metoprolol succinate  25 mg Oral Daily  . potassium chloride SA  20 mEq Oral BID  . sodium chloride  flush  3 mL Intravenous Q12H      Allergies  Allergen Reactions  . Penicillins Other (See Comments)    Reaction:  Unknown     Social History   Social History  . Marital Status: Single    Spouse Name: N/A  . Number of Children: N/A  . Years of Education: N/A   Occupational History  . Not on file.   Social History Main Topics  . Smoking status: Former Research scientist (life sciences)  . Smokeless tobacco: Not on file  . Alcohol Use: 0.0 oz/week    0 Standard drinks or equivalent per week  . Drug Use: 2.00 per week  . Sexual Activity: Not on file   Other Topics Concern  . Not on file   Social History Narrative    Family History  Problem Relation Age of Onset  . Diabetes Other     ROS- All systems are reviewed and negative except as per the HPI above  Physical Exam: Telemetry: Filed Vitals:   12/03/15 2000 12/03/15 2051 12/04/15 0359 12/04/15 0742  BP: 101/57 119/72 120/60 99/47  Pulse: 69 74 77 85  Temp:  98.1 F (36.7 C) 98.1 F (36.7 C) 98.1 F (36.7 C)  TempSrc:  Oral Oral Oral  Resp: 15 16 16 18   Height:      Weight:  378 lb (171.46 kg) 379 lb 14.4 oz (172.322 kg)   SpO2: 98% 100% 91%  90%    GEN- The patient is morbidly obese and chronically ill appearing, alert but not very verbal, drooling into a cloth Head- normocephalic, atraumatic Eyes-  Sclera clear, conjunctiva pink Ears- hearing intact Oropharynx- clear Neck- supple, + JVD Lungs- Clear to ausculation bilaterally, normal work of breathing Heart- Regular rate and rhythm, loud P2 GI- soft, NT, ND, + BS Extremities- no clubbing, cyanosis, + dependant edema MS- no significant deformity or atrophy Skin- no rash or lesion Psych- depressed mood and flat affect, drooling and not very verbal Neuro- strength and sensation are intact  EKG reveals sinus rhythm with very low forces throughout, nonspecific ST/T changes  Labs:   Lab Results  Component Value Date   WBC 9.2 12/04/2015   HGB 12.3 12/04/2015   HCT 42.9  12/04/2015   MCV 81.9 12/04/2015   PLT 155 12/04/2015    Recent Labs Lab 12/03/15 1802 12/04/15 0500  NA 138 139  K 4.2 4.4  CL 97* 98*  CO2 34* 34*  BUN 61* 58*  CREATININE 1.33* 1.15*  CALCIUM 8.6* 8.5*  PROT 6.6  --   BILITOT 0.6  --   ALKPHOS 59  --   ALT 38  --   AST 20  --   GLUCOSE 134* 159*   Lab Results  Component Value Date   CKTOTAL 48 03/04/2014   CKMB 0.6 03/04/2014   TROPONINI 0.50* 12/03/2015    Lab Results  Component Value Date   CHOL 173 12/18/2013   Lab Results  Component Value Date   HDL 48 12/18/2013   Lab Results  Component Value Date   LDLCALC 105* 12/18/2013   Lab Results  Component Value Date   TRIG 101 12/18/2013    Radiology: CXR is reviewed and reveal prominent pulmonary vasculature, some edema CT reviewed  Echo reviewed  ASSESSMENT AND PLAN:   1. SOB Due to pulmonary hypertension and cor pulmonale Complicated by medical noncompliance I worry that her prognosis is very poor as she has pickwickian syndrome with obesity/ hypoventilatory syndrome and severe pulmonary hypertension.  She is not compliant with O2.  I think that she may benefit from pulmonary consultation for pulmonary hypertension management Gentle diuresis is reasonable though cor pulmonale may be difficult to treat in the face of her pulmonary issues secondary to obesity  2. Elevated troponin Flat when compared to recent hospitalization No acute findings on ekg Likely demand ischemic secondary to #1 No indication for heparin or further CV testing currently  3. Pulmonary hypertension with cor pulmonale Consider pulmonary consultation  4. Morbid obesity Could consider bariatric surgery Her prognosis is very poor  No inpatient CV testing planned  Cardiology to see as needed over the weekend Please call with questions   Thompson Grayer, MD 12/04/2015  9:35 AM

## 2015-12-04 NOTE — Progress Notes (Signed)
Called report to [place] in CCU.  Resp here to place pt on bipap

## 2015-12-04 NOTE — Progress Notes (Signed)
[place] Progress Note Patient Name: Jodi [name] DOB: 06-Mar-1961 MRN: [id number]   Date of Service  12/04/2015  HPI/Events of Note  Camera check on patient.  Currently off of BiPAP on nasal cannula oxygen. Normal work of breathing with respiratory rate 24. Denies any chest pain or pressure.  eICU Interventions  Continue close monitoring in the intensive care unit given potential risk of intubation.     Intervention Category Major Interventions: Respiratory failure - evaluation and management  Tera Partridge 12/04/2015, 7:57 PM

## 2015-12-04 NOTE — Progress Notes (Signed)
Dr. Stevenson Clinch at bedside talking with patient's daughter and son, updating them on patient's status and plan of care.

## 2015-12-04 NOTE — Progress Notes (Signed)
Pt transferred to room CCU 12 via bed and bipap, pt more alert since placing on Bipap.

## 2015-12-04 NOTE — Progress Notes (Signed)
Patient is alert and oriented x 4, admitted to room 234 with an diagnosis of acute on chronic CHF. Tele verified by the RN and a Daniel (NT). Fall Neurosurgeon. Skin assessment done with Cristopher Peru RN, no skin conditions of concern noted. Patient was oriented to her room, staff, call bell and ascom/call bell. Bed in the lowest position.Will continue to monitor.

## 2015-12-04 NOTE — Progress Notes (Signed)
Report given to Sharyn Lull, RN who is taking over patient's care.  Patria Mane, NP at bedside speaking with patient and daughter at this time.

## 2015-12-04 NOTE — Progress Notes (Signed)
Pt refusing insulin coverage this am till she speaks with dr. About it

## 2015-12-04 NOTE — Plan of Care (Signed)
Problem: Safety: Goal: Ability to remain free from injury will improve Outcome: Progressing Fall precautions in place  Problem: Health Behavior/Discharge Planning: Goal: Ability to manage health-related needs will improve Outcome: Not Progressing Non compliant with oxygen, case management consult pending  Problem: Tissue Perfusion: Goal: Risk factors for ineffective tissue perfusion will decrease Outcome: Progressing SQ Heparin  Problem: Cardiac: Goal: Ability to achieve and maintain adequate cardiopulmonary perfusion will improve Outcome: Progressing Daily weight, I&O

## 2015-12-04 NOTE — Progress Notes (Signed)
Called MD with arterial blood gas results.  Patient is being transferred to CCU for Bipap therapy.

## 2015-12-04 NOTE — Progress Notes (Signed)
Patient currently on 3L nasal cannula and o2 sats 95% with no distress noted. Patient wanted to take a break from bipap after abg was drawn, A&Ox4. ABG results called to Dr. Milinda Hirschfeld by Joanne Chars, RRT. This RN also spoke with Dr. Milinda Hirschfeld and informed MD that patient is asking to eat and drink.  Dr. Milinda Hirschfeld stated "she cant eat and cant drink and needs to wear the bipap all night."  RN asked MD to speak with family and patient. Dr. Milinda Hirschfeld stated he would have Patria Mane, NP come talk with family and patient. RN spoke with Patria Mane, NP and she is coming to speak with patient and family.

## 2015-12-04 NOTE — Consult Note (Signed)
PULMONARY / CRITICAL CARE MEDICINE   Name: Jodi [name] MRN: [id number] DOB: 1961/01/30    ADMISSION DATE:  12/03/2015 CONSULTATION DATE:  12/04/15  REFERRING MD : Dr. Ether Griffins   CHIEF COMPLAINT:   SOB, AMS, Hypercarbia   HISTORY OF PRESENT ILLNESS   55 year old female past medical history of diastolic CHF, morbid obesity, severe pulmonary hypertension, cor pulmonale, obesity hypoventilation syndrome, Pickwickian syndrome, hypertension, seen in consultation for hypercarbic respiratory failure. Patient was recently discharged from [facility] on 11/19/2015 for shortness of breath and CHF exacerbation, that admission she was diuresed and discharge, she supposed to be wearing supplemental oxygen at night, taking her diuretics as prescribed, and having a low salt diet; however she says she has not been completely compliant with her diet and with her O2 usage. She was readmitted to [facility] telemetry floor for shortness of breath, this morning she was noted to be moderately lethargic, and altered, a stat ABG showed hypercarbic respiratory failure with a CO2 of 80, she was placed on BiPAP and transferred to the ICU unit. At the time of my evaluation patient was on a nonrebreather, she was altered and moderately somnolent, difficult to awake, upon initiation of BiPAP within 2-3 minutes she was back to baseline level of alertness and mentation.She states she had about a 20-25 pound weight gain over the last week, which she attributes to medication noncompliance and poor diet.    SIGNIFICANT EVENTS  12/04/15>>Hypercarbic respiratory failure requiring BiPAP    PAST MEDICAL HISTORY    :  Past Medical History  Diagnosis Date  . Bipolar disorder ([place])   . Chronic back pain   . Chronic knee pain   . Morbid obesity ([place])   . Obesity hypoventilation syndrome ([place])   . Depression   . Diabetes mellitus     Type II  . Moderate to severe pulmonary hypertension ([place])   . Pickwickian  syndrome ([place])   . Cor pulmonale (chronic) ([place])   . Hypertension   . Hyperlipemia   . Thoracic aortic aneurysm ([place])   . Atypical chest pain    Past Surgical History  Procedure Laterality Date  . Right oophorectomy     Prior to Admission medications   Medication Sig Start Date End Date Taking? Authorizing Provider  ARIPiprazole (ABILIFY) 15 MG tablet Take 15 mg by mouth at bedtime.   Yes Historical Provider, MD  aspirin EC 81 MG EC tablet Take 1 tablet (81 mg total) by mouth daily. 11/19/15  Yes Adrian Prows, MD  divalproex (DEPAKOTE) 500 MG DR tablet Take 500 mg by mouth at bedtime.   Yes Historical Provider, MD  furosemide (LASIX) 40 MG tablet Take 2 tablets (80 mg total) by mouth 2 (two) times daily. 09/16/15  Yes Venetia Maxon Rama, MD  ibuprofen (ADVIL,MOTRIN) 200 MG tablet Take 400 mg by mouth every 6 (six) hours as needed for headache or mild pain.   Yes Historical Provider, MD  metFORMIN (GLUCOPHAGE) 500 MG tablet Take 500 mg by mouth 2 (two) times daily with a meal.   Yes Historical Provider, MD  potassium chloride SA (K-DUR,KLOR-CON) 20 MEQ tablet Take 1 tablet (20 mEq total) by mouth 2 (two) times daily. 11/19/15  Yes Adrian Prows, MD  lisinopril (PRINIVIL,ZESTRIL) 5 MG tablet Take 1 tablet (5 mg total) by mouth every morning. 11/19/15   Adrian Prows, MD  metoprolol succinate (TOPROL XL) 25 MG 24 hr tablet Take 1 tablet (25 mg total) by mouth daily. 11/19/15   Ulice Dash  Einar Gip, MD   Allergies  Allergen Reactions  . Penicillins Other (See Comments)    Reaction:  Unknown      FAMILY HISTORY   Family History  Problem Relation Age of Onset  . Diabetes Other       SOCIAL HISTORY    reports that she has quit smoking. She does not have any smokeless tobacco history on file. She reports that she drinks alcohol. She reports that she uses illicit drugs about twice per week.  Review of Systems  Constitutional: Negative for fever, chills and weight loss.  HENT: Negative for hearing loss.   Eyes:  Negative for blurred vision, double vision, photophobia, pain and discharge.  Respiratory: Positive for shortness of breath. Negative for cough, hemoptysis, sputum production and wheezing.   Cardiovascular: Positive for leg swelling. Negative for chest pain, palpitations and orthopnea.  Gastrointestinal: Negative for heartburn, nausea and vomiting.  Genitourinary: Negative for dysuria.  Musculoskeletal: Negative for myalgias, back pain and neck pain.  Skin: Negative for itching and rash.  Neurological: Positive for weakness. Negative for dizziness, tingling, loss of consciousness and headaches.  Endo/Heme/Allergies: Negative for environmental allergies.  Psychiatric/Behavioral: Negative for depression.      VITAL SIGNS    Temp:  [98.1 F (36.7 C)-98.3 F (36.8 C)] 98.1 F (36.7 C) (03/18 0742) Pulse Rate:  [69-85] 79 (03/18 1020) Resp:  [15-28] 18 (03/18 0742) BP: (99-120)/(47-72) 103/63 mmHg (03/18 1020) SpO2:  [64 %-100 %] 85 % (03/18 1020) Weight:  [375 lb (170.099 kg)-379 lb 14.4 oz (172.322 kg)] 379 lb 14.4 oz (172.322 kg) (03/18 0359) HEMODYNAMICS:   VENTILATOR SETTINGS:   INTAKE / OUTPUT:  Intake/Output Summary (Last 24 hours) at 12/04/15 1122 Last data filed at 12/04/15 1041  Gross per 24 hour  Intake    243 ml  Output    700 ml  Net   -457 ml       PHYSICAL EXAM   Physical Exam  Constitutional: She appears well-developed and well-nourished.  HENT:  Head: Normocephalic and atraumatic.  Right Ear: External ear normal.  Left Ear: External ear normal.  Mouth/Throat: Oropharynx is clear and moist.  Eyes: Conjunctivae are normal. Pupils are equal, round, and reactive to light. Right eye exhibits no discharge.  Neck: Normal range of motion. Neck supple.  Cardiovascular: Normal rate, regular rhythm, normal heart sounds and intact distal pulses.   No murmur heard. Pulmonary/Chest: Effort normal. She has no wheezes. She has no rales. She exhibits no tenderness.   Currently on Bipap, mild shallow BS at the bases.   Musculoskeletal: Normal range of motion.  1+ edema B\L lower ext   Neurological: She is alert.  Skin: Skin is warm and dry. No rash noted. No erythema.  Psychiatric: She has a normal mood and affect.  Nursing note and vitals reviewed.      LABS   LABS:  CBC  Recent Labs Lab 12/03/15 1802 12/04/15 0500  WBC 11.3* 9.2  HGB 12.8 12.3  HCT 44.8 42.9  PLT 169 155   Coag's No results for input(s): APTT, INR in the last 168 hours. BMET  Recent Labs Lab 12/03/15 1802 12/04/15 0500  NA 138 139  K 4.2 4.4  CL 97* 98*  CO2 34* 34*  BUN 61* 58*  CREATININE 1.33* 1.15*  GLUCOSE 134* 159*   Electrolytes  Recent Labs Lab 12/03/15 1802 12/04/15 0500  CALCIUM 8.6* 8.5*   Sepsis Markers No results for input(s): LATICACIDVEN, PROCALCITON, O2SATVEN in the last  168 hours. ABG  Recent Labs Lab 12/04/15 0954  PHART 7.29*  PCO2ART 88*  PO2ART 60*   Liver Enzymes  Recent Labs Lab 12/03/15 1802  AST 20  ALT 38  ALKPHOS 59  BILITOT 0.6  ALBUMIN 3.3*   Cardiac Enzymes  Recent Labs Lab 12/03/15 1802 12/03/15 2147  TROPONINI 0.55* 0.50*   Glucose  Recent Labs Lab 12/03/15 2058 12/04/15 0737  GLUCAP 123* 165*     No results found for this or any previous visit (from the past 240 hour(s)).   Current facility-administered medications:  .  acetaminophen (TYLENOL) tablet 650 mg, 650 mg, Oral, Q6H PRN, 650 mg at 12/04/15 0406 **OR** acetaminophen (TYLENOL) suppository 650 mg, 650 mg, Rectal, Q6H PRN, Lytle Butte, MD .  ARIPiprazole (ABILIFY) tablet 15 mg, 15 mg, Oral, QHS, Lytle Butte, MD, 15 mg at 12/03/15 2126 .  aspirin EC tablet 81 mg, 81 mg, Oral, Daily, Lytle Butte, MD, 81 mg at 12/04/15 0914 .  divalproex (DEPAKOTE) DR tablet 500 mg, 500 mg, Oral, QHS, Lytle Butte, MD, 500 mg at 12/03/15 2126 .  furosemide (LASIX) injection 40 mg, 40 mg, Intravenous, BID, Theodoro Grist, MD, 40 mg at  12/04/15 0745 .  heparin injection 5,000 Units, 5,000 Units, Subcutaneous, 3 times per day, Lytle Butte, MD, 5,000 Units at 12/03/15 2200 .  insulin aspart (novoLOG) injection 0-5 Units, 0-5 Units, Subcutaneous, QHS, Lytle Butte, MD, 0 Units at 12/03/15 2200 .  insulin aspart (novoLOG) injection 0-9 Units, 0-9 Units, Subcutaneous, TID WC, Lytle Butte, MD .  ipratropium-albuterol (DUONEB) 0.5-2.5 (3) MG/3ML nebulizer solution 3 mL, 3 mL, Nebulization, Q4H PRN, Lytle Butte, MD .  metoprolol succinate (TOPROL-XL) 24 hr tablet 25 mg, 25 mg, Oral, Daily, Theodoro Grist, MD, 25 mg at 12/04/15 0914 .  morphine 2 MG/ML injection 2 mg, 2 mg, Intravenous, Q4H PRN, Lytle Butte, MD .  ondansetron [facility]) tablet 4 mg, 4 mg, Oral, Q6H PRN **OR** ondansetron (ZOFRAN) injection 4 mg, 4 mg, Intravenous, Q6H PRN, Lytle Butte, MD .  oxyCODONE (Oxy IR/ROXICODONE) immediate release tablet 5 mg, 5 mg, Oral, Q4H PRN, Lytle Butte, MD .  potassium chloride SA (K-DUR,KLOR-CON) CR tablet 20 mEq, 20 mEq, Oral, BID, Lytle Butte, MD, 20 mEq at 12/04/15 0914 .  sodium chloride flush (NS) 0.9 % injection 3 mL, 3 mL, Intravenous, Q12H, Lytle Butte, MD, 3 mL at 12/04/15 1041  IMAGING    Dg Chest 2 View  12/03/2015  CLINICAL DATA:  Shortness of breath for 1 day EXAM: CHEST  2 VIEW COMPARISON:  Chest radiograph November 16, 2015 and chest CT November 18, 2015 FINDINGS: There is no edema or consolidation. Heart is enlarged with mild pulmonary venous hypertension. No demonstrable adenopathy. No bone lesions. IMPRESSION: Evidence of a degree of pulmonary vascular congestion without frank edema or consolidation. Electronically Signed   By: Lowella Grip III M.D.   On: 12/03/2015 19:01      Indwelling Urinary Catheter continued, requirement due to   Reason to continue Indwelling Urinary Catheter for strict Intake/Output monitoring for hemodynamic instability   Central Line continued, requirement due to   Reason  to continue [employer] Monitoring of central venous pressure or other hemodynamic parameters   Ventilator continued, requirement due to, resp failure    Ventilator Sedation RASS 0 to -2   Cultures: BCx2  UC  Sputum  Antibiotics:  Lines:   ASSESSMENT/PLAN  55 year old female  with complex medical history to include obesity hypoventilation syndrome, morbid obesity, pickwickian syndrome, cor pulmonale, diastolic CHF, hypertension, diabetes type 2, seen in consultation for acute hypercarbic respiratory failure secondary to acute on chronic CHF exacerbation.  Hypercarbic respiratory failure-requiring BiPAP -Most likely secondary to acute on chronic CHF exacerbation - Will continue with BiPAP, maintain O2 saturations greater than 88% -Check ABG in 4 hours -She was already becoming more alert and awake after only a few minutes of BiPAP. -She does require nocturnal O2 for which she has not been compliant with -Given her level of obesity, obesity hypoventilation syndrome, she might be a better candidate for noninvasive positive pressure ventilation upon discharge, such as CPAP or BiPAP  Cor pulmonale -Most likely due to obesity, questionable OSA/OHS, and heart failure -Cardiology currently following -Gentle diuresis -Continue supplemental oxygen, especially at night  Pulmonary hypertension -Group 2 (heart disease) and group 3 (pulmonary disorders to include obesity hypoventilation syndrome, questionable obstructive sleep apnea, morbid obesity) -Patient does not have pulmonary arterial hypertension and therefore does not require any forms of prostacyclin, prostaglandins or other forms of vasodilators at this time -Treating the underlying condition such as heart disease and underlying lung or sleep disorders is paramount to maintaining her pulmonary pressures and her heart failure.  Nocturnal Hypoxemia -Patient supposed to be wearing supplemental oxygen at night, but she is not  compliant with this - educated patient on the use of nocturnal oxygen, continue to maintain saturations >88% -There is a component of obesity hypoventilation syndrome, and patient may benefit from NIVPPV - this can be discussed closer to discharge.   Acute on chronic diastolic heart failure -Patient has stated she has been noncompliant with some of her meds and her diet -Cardiology is following, recommend gentle diuresis while not to decrease her preload significantly given her level of cor pulmonale -BP management  Hypertension -Toprol-XL, Lasix, PRN hydralazine  Diabetes type 2 -Continue with SSI  Obesity hypoventilation syndrome,? OSA -BiPAP at night during inpatient stay   I have personally obtained a history, examined the patient, evaluated laboratory and imaging results, formulated the assessment and plan and placed orders.  The Patient requires high complexity decision making for assessment and support, frequent evaluation and titration of therapies, application of advanced monitoring technologies and extensive interpretation of multiple databases. Critical Care Time devoted to patient care services described in this note is 45 minutes.   Overall, patient is critically ill, prognosis is guarded. Patient at high risk for cardiac arrest and death.   Vilinda Boehringer, MD [facility] Pager [phone] (please enter 7-digits) On Call Pager [phone] (please enter 7-digits)   12/04/2015, 11:22 AM  Note: This note was prepared with Dragon dictation along with smaller phrase technology. Any transcriptional errors that result from this process are unintentional.

## 2015-12-04 NOTE — Progress Notes (Signed)
ICU Update I have spoken with patient and the family at length about patient's multiple complex medical issues including diastolic CHF, cor pulmonale, pulmonary hypertension, morbid obesity, obesity hypoventilation syndrome, and CO2 narcosis.  At this time ventilating the patient via the BiPAP is not really effective, to subsequent ABGs are still show significant CO2 retention, with CO2 in the 80s. I explained to the family and the patient, who ironically is alert but moderately confused, that we are not effectively ventilating via the BiPAP and eventually CO2 narcosis will cause neurologic depression. I explained that if the patient continues to decline clinically the only other alternative would be mechanical intubation, at this time the patient has refused mechanical intubation, and her children are in agreement with her.  I do believe that with the level of CO2, she is not appropriate to make medical decisions effectively, and I have relied on her 2 children to make medical decisions for her, and at this time their interest are in line with their mother's.  Currently patient is refusing multiple medications.   Patient does have impending respiratory failure and will be a difficult intubation. I have evaluated her and she does have a difficult difficult airway, with [place]=4, and morbid obesity. In the event her clinical status continues to decline, I am almost confident that children will move towards intubation. I have contacted anesthesiology, Dr. Kayleen Memos [phone], and explained the case along with possibility for difficult airway, he verbalizes understanding and stated he would assist with intubation in this complex patient.  Vilinda Boehringer, MD [facility] Pager [phone] (please enter 7-digits) On Call Pager - [phone] (please enter 7-digits)

## 2015-12-05 ENCOUNTER — Inpatient Hospital Stay: Payer: Medicare Other

## 2015-12-05 DIAGNOSIS — I2781 Cor pulmonale (chronic): Secondary | ICD-10-CM

## 2015-12-05 LAB — GLUCOSE, CAPILLARY
GLUCOSE-CAPILLARY: 111 mg/dL — AB (ref 65–99)
GLUCOSE-CAPILLARY: 171 mg/dL — AB (ref 65–99)
GLUCOSE-CAPILLARY: 118 mg/dL — AB (ref 65–99)
Glucose-Capillary: 134 mg/dL — ABNORMAL HIGH (ref 65–99)

## 2015-12-05 LAB — BASIC METABOLIC PANEL
Anion gap: 1 — ABNORMAL LOW (ref 5–15)
BUN: 40 mg/dL — AB (ref 6–20)
CO2: 42 mmol/L — AB (ref 22–32)
Calcium: 8.8 mg/dL — ABNORMAL LOW (ref 8.9–10.3)
Chloride: 100 mmol/L — ABNORMAL LOW (ref 101–111)
Creatinine, Ser: 0.82 mg/dL (ref 0.44–1.00)
GFR calc Af Amer: >60 mL/min (ref >60)
GLUCOSE: 120 mg/dL — AB (ref 65–99)
POTASSIUM: 4.3 mmol/L (ref 3.5–5.1)
Sodium: 143 mmol/L (ref 135–145)

## 2015-12-05 MED ORDER — METFORMIN HCL 500 MG PO TABS
500.0000 mg | ORAL_TABLET | Freq: Two times a day (BID) | ORAL | Status: DC
  Administered 2015-12-05 – 2015-12-09 (×8): 500 mg via ORAL
  Filled 2015-12-05 (×8): qty 1

## 2015-12-05 NOTE — Progress Notes (Signed)
Originally placed patient on bipap 9pm 3/18.  She wore for appx 2 hours continuously.  She has now been off and on.  She keeps requesting to come off however family and RN has been good at coaxing her to keep mask on.  She is asleep at time but easily arousable. She is in no distress. Mask was changed earlier to a medium for better fit and less air resistance. Will continue to monitor

## 2015-12-05 NOTE — Progress Notes (Signed)
Patient is drowsy but easily arousable to voice.  She is currently on BiPAP and sat's are in the mid 90's.  She has been requesting to take the mask off throughout night shift but family has been good at assisting RN in encouraging her to keep it on.  Vital signs remain stable and urine output has been adequate.  Will continue to monitor.

## 2015-12-05 NOTE — Progress Notes (Addendum)
[facility] NAME: Jodi [name]    MR#:  [id number]  DATE OF BIRTH:  Mar 11, 1961  SUBJECTIVE:  CHIEF COMPLAINT:   Chief Complaint  Patient presents with  . Shortness of Breath   Patient is 55 year old dominant female with medical history significant for history of affective sleep apnea/OHS, obesity, who presents to the hospital with complaints of shortness of breath and lower extremity swelling, which had been progressing over the past one week. Patient admitted of medical and dietary noncompliance including noncompliance with oxygen therapy. On arrival to emergency room, she was noted to have O2 sats in 60s on room air. She admitted of approximately 20-25 pound weight gain. On arrival to emergency room, she was given diuretics and diuresed, she admited of feeling better, however, not able to provide much more history. Since she was  Drooling, poorly responsive, ABGs were checked and she was found to have markedly elevated PCO2 level at 88 and respiratory acidosis, she was transferred to intensive care unit and was initiated on BiPAP. Uncomfortable with BiPAP, intermittently takes it off. The patient's family as well as patient herself are unreasonable in regards to medical care issues, discussed with Dr. Stevenson Clinch Of note, most recent CT angiogram of the chest March 2017 revealed 4.1 cm ascending aortic aneurysm.    Review of Systems  Unable to perform ROS: critical illness    VITAL SIGNS: Blood pressure 114/67, pulse 76, temperature 98.5 F (36.9 C), temperature source Axillary, resp. rate 17, height 5\' 4"  (1.626 m), weight 170.8 kg (376 lb 8.7 oz), SpO2 94 %.  PHYSICAL EXAMINATION:   GENERAL:  55 y.o.-year-old patient lying in the bed , BiPAP mask on the face, in moderate to severe respiratory distress, , more responsive to verbal stimuli, able to speak but drifts back to sleep intermittently,  somnolent. Morbidly obese EYES: Pupils  equal, round, reactive to light and accommodation. No scleral icterus. Extraocular muscles intact.  HEENT: Head atraumatic, normocephalic. Oropharynx and nasopharynx clear.  NECK:  Supple, no jugular venous distention. No thyroid enlargement, no tenderness.  LUNGS: Some diminished breath sounds bilaterally at bases, especially and posteriorly, no wheezing, but few anterior rhonchi noted. Using accessory muscles of respiration, tachypneic, uncomfortable, on BiPAP.  CARDIOVASCULAR: S1, S2 , tachycardic. No murmurs, rubs, or gallops, distant.  ABDOMEN: Soft, nontender, nondistended. Bowel sounds present. No organomegaly or mass.  EXTREMITIES: 3+ lower extremity and pedal edema, no cyanosis, or clubbing.  NEUROLOGIC: Cranial nerves II through XII are grossly intact., Although evaluation is severely limited due to patient's poor responsiveness.  Muscle strength 5/5 in all extremities. Sensation grossly intact. Gait not checked.  PSYCHIATRIC: The patient is somnolent, however, awaken and able to converse briefly, then drifts back to sleep, unable to assess orientation , but able to answer a questions appropriately.  SKIN: No obvious rash, lesion, or ulcer.   ORDERS/RESULTS REVIEWED:   CBC  Recent Labs Lab 12/03/15 1802 12/04/15 0500  WBC 11.3* 9.2  HGB 12.8 12.3  HCT 44.8 42.9  PLT 169 155  MCV 80.9 81.9  MCH 23.1* 23.4*  MCHC 28.6* 28.5*  RDW 22.2* 21.7*  LYMPHSABS 1.3  --   MONOABS 1.2*  --   EOSABS 0.1  --   BASOSABS 0.1  --    ------------------------------------------------------------------------------------------------------------------  Chemistries   Recent Labs Lab 12/03/15 1802 12/04/15 0500 12/05/15 0413  NA 138 139 143  K 4.2 4.4 4.3  CL 97* 98* 100*  CO2  34* 34* 42*  GLUCOSE 134* 159* 120*  BUN 61* 58* 40*  CREATININE 1.33* 1.15* 0.82  CALCIUM 8.6* 8.5* 8.8*  AST 20  --   --   ALT 38  --   --   ALKPHOS 59  --   --   BILITOT 0.6  --   --     ------------------------------------------------------------------------------------------------------------------ estimated creatinine clearance is 123.7 mL/min (by C-G formula based on Cr of 0.82). ------------------------------------------------------------------------------------------------------------------ No results for input(s): TSH, T4TOTAL, T3FREE, THYROIDAB in the last 72 hours.  Invalid input(s): FREET3  Cardiac Enzymes  Recent Labs Lab 12/03/15 1802 12/03/15 2147  TROPONINI 0.55* 0.50*   ------------------------------------------------------------------------------------------------------------------ Invalid input(s): POCBNP ---------------------------------------------------------------------------------------------------------------  RADIOLOGY: Dg Chest 2 View  12/03/2015  CLINICAL DATA:  Shortness of breath for 1 day EXAM: CHEST  2 VIEW COMPARISON:  Chest radiograph November 16, 2015 and chest CT November 18, 2015 FINDINGS: There is no edema or consolidation. Heart is enlarged with mild pulmonary venous hypertension. No demonstrable adenopathy. No bone lesions. IMPRESSION: Evidence of a degree of pulmonary vascular congestion without frank edema or consolidation. Electronically Signed   By: Lowella Grip III M.D.   On: 12/03/2015 19:01   US Venous Img Lower Bilateral  12/05/2015  CLINICAL DATA:  Swelling for 3 weeks. EXAM: BILATERAL LOWER EXTREMITY VENOUS DOPPLER ULTRASOUND TECHNIQUE: Gray-scale sonography with graded compression, as well as color Doppler and duplex ultrasound were performed to evaluate the lower extremity deep venous systems from the level of the common femoral vein and including the common femoral, femoral, profunda femoral, popliteal and calf veins including the posterior tibial, peroneal and gastrocnemius veins when visible. The superficial great saphenous vein was also interrogated. Spectral Doppler was utilized to evaluate flow at rest and with  distal augmentation maneuvers in the common femoral, femoral and popliteal veins. COMPARISON:  None. FINDINGS: RIGHT LOWER EXTREMITY Normal compressibility, augmentation and color Doppler flow in the right common femoral vein, right femoral vein and right popliteal vein. Right profunda femoral vein is patent without thrombus. Limited evaluation of the calf veins due to body habitus and edema. The right saphenofemoral junction is patent. LEFT LOWER EXTREMITY Normal compressibility, augmentation and color Doppler flow in the left common femoral vein, left femoral vein and left popliteal vein. The left saphenofemoral junction is patent. Left profunda femoral vein is patent without thrombus. Limited evaluation of the left calf veins. IMPRESSION: No evidence of deep venous thrombosis. Limited evaluation of the calf veins as described. Electronically Signed   By: Markus Daft M.D.   On: 12/05/2015 11:35    EKG:  Orders placed or performed during the hospital encounter of 12/03/15  . EKG 12-Lead  . EKG 12-Lead  . EKG 12-Lead  . EKG 12-Lead  . EKG 12-Lead  . EKG 12-Lead    ASSESSMENT AND PLAN:  Principal Problem:   Acute on chronic diastolic (congestive) heart failure (HCC) Active Problems:   Pulmonary hypertension (HCC)   Acute respiratory failure with hypoxia and hypercarbia (HCC)   Morbid obesity due to excess calories (HCC)   Nocturnal hypoxemia due to obesity  #1. Acute on chronic respiratory failure with hypoxia and hypercapnia, continue intensive care , BiPAP , pulmonary consultation is appreciated, patient may benefit from tracheostomy tube placement in the long run.  #2 acute on chronic diastolic CHF, continue Lasix, watching patient's ins and outs, continue Foley catheter , patient is net negative for 3.8 L, patient may benefit from right and left heart catheterization #3 metabolic encephalopathy, due to  hypercapnia, improved with therapy. #4. Renal insufficiency, improved with decreased  Lasix dose, watching ins and outs #5. Elevated troponin, subcutaneous heparin, aspirin, nitroglycerin, appreciate cardiologist input, recommended metoprolol as well as lisinopril, however, patient refused. #6. Leukocytosis, resolved. #7. Diabetes mellitus with hemoglobin A1c 7.7, resume metformin, continue sliding scale insulin, watching patient's kidney function closely #8. Ascending aortic aneurysm, gettingvascular surgery involvement, patient may benefit from right and left heart catheterization Management plans discussed with the patient, family and they are in agreement.   DRUG ALLERGIES:  Allergies  Allergen Reactions  . Penicillins Other (See Comments)    Reaction:  Unknown     CODE STATUS:     Code Status Orders        Start     Ordered   12/03/15 1948  Full code   Continuous     12/03/15 1948    Code Status History    Date Active Date Inactive Code Status Order ID Comments User Context   09/15/2015  4:29 AM 09/16/2015  3:45 PM Full Code [id number]  Reubin Milan, MD Inpatient      TOTAL CRITICAL CARE TIME TAKING CARE OF THIS PATIENT: 60 minutes.  Discussed with Dr. Stevenson Clinch, patient's sister, all questions were answered, time spent approximately 15 minutes on discussions  Teegan Brandis M.D on 12/05/2015 at 1:16 PM  Between 7am to 6pm - Pager - [phone]  After 6pm go to [email or web address] - password [email or web address] [facility]  [facility]  Office  [phone]  CC: Primary care physician; Tomasita Morrow, MD

## 2015-12-05 NOTE — Progress Notes (Signed)
PULMONARY / CRITICAL CARE MEDICINE   Name: Jodi [name] MRN: [id number] DOB: 04-17-1961    ADMISSION DATE:  12/03/2015  BRIEF HISTORY: 55 year old female past medical history of diastolic CHF, morbid obesity, severe pulmonary hypertension, cor pulmonale, obesity hypoventilation syndrome, Pickwickian syndrome, hypertension, seen in consultation for hypercarbic respiratory failure.  To have CO2 narcosis, with chronic CO2 retention. Currently wearing BiPAP for treatment.  SUBJECTIVE:  Doing well this morning, and time of my evaluation she was on nasal cannula. She did not tolerate BiPAP well at all last night, home mass was changed to make her more comfortable, but she removed it multiple times throughout the night.  STUDIES:    SIGNIFICANT EVENTS: 3/18> transfer to the ICU for CO2 narcosis along with increased somnolence  VITAL SIGNS: Temp:  [98.2 F (36.8 C)-98.5 F (36.9 C)] 98.5 F (36.9 C) (03/19 0400) Pulse Rate:  [58-76] 76 (03/19 0928) Resp:  [12-26] 17 (03/19 0900) BP: (94-126)/(55-81) 114/67 mmHg (03/19 0928) SpO2:  [91 %-100 %] 94 % (03/19 0900) FiO2 (%):  [50 %] 50 % (03/18 1700) Weight:  [376 lb 8.7 oz (170.8 kg)] 376 lb 8.7 oz (170.8 kg) (03/19 0423) HEMODYNAMICS:   VENTILATOR SETTINGS: Vent Mode:  [-]  FiO2 (%):  [50 %] 50 % INTAKE / OUTPUT:  Intake/Output Summary (Last 24 hours) at 12/05/15 1135 Last data filed at 12/05/15 0930  Gross per 24 hour  Intake      3 ml  Output   3040 ml  Net  -3037 ml    Review of Systems  Constitutional: Positive for malaise/fatigue. Negative for fever, chills and weight loss.  HENT: Negative for hearing loss and tinnitus.   Eyes: Negative for blurred vision and double vision.  Respiratory: Positive for shortness of breath. Negative for cough, hemoptysis and wheezing.   Cardiovascular: Negative for chest pain and palpitations.  Gastrointestinal: Negative for heartburn, nausea, vomiting and abdominal pain.   Genitourinary: Negative for dysuria and urgency.  Musculoskeletal: Negative for myalgias.  Skin: Negative for itching and rash.  Neurological: Positive for weakness. Negative for dizziness and headaches.  Endo/Heme/Allergies: Negative for environmental allergies. Does not bruise/bleed easily.  Psychiatric/Behavioral: Negative for depression.    Physical Exam  Constitutional: She is oriented to person, place, and time and well-developed, well-nourished, and in no distress.  HENT:  Head: Normocephalic and atraumatic.  Right Ear: External ear normal.  Nose: Nose normal.  Mouth/Throat: Oropharynx is clear and moist.  Eyes: Conjunctivae and EOM are normal. Pupils are equal, round, and reactive to light.  Neck: Normal range of motion. Neck supple.  Cardiovascular: Normal rate, regular rhythm, normal heart sounds and intact distal pulses.   No murmur heard. Pulmonary/Chest: Effort normal. She has no wheezes.  Mild respiratory distress, wearing Boyle Mild dec BS at bases  Abdominal: Soft.  Musculoskeletal: Normal range of motion.  Neurological: She is alert and oriented to person, place, and time.  Skin: Skin is warm and dry.  Nursing note and vitals reviewed.    LABS:  CBC  Recent Labs Lab 12/03/15 1802 12/04/15 0500  WBC 11.3* 9.2  HGB 12.8 12.3  HCT 44.8 42.9  PLT 169 155   Coag's No results for input(s): APTT, INR in the last 168 hours. BMET  Recent Labs Lab 12/03/15 1802 12/04/15 0500 12/05/15 0413  NA 138 139 143  K 4.2 4.4 4.3  CL 97* 98* 100*  CO2 34* 34* 42*  BUN 61* 58* 40*  CREATININE 1.33* 1.15* 0.82  GLUCOSE 134* 159* 120*   Electrolytes  Recent Labs Lab 12/03/15 1802 12/04/15 0500 12/05/15 0413  CALCIUM 8.6* 8.5* 8.8*   Sepsis Markers No results for input(s): LATICACIDVEN, PROCALCITON, O2SATVEN in the last 168 hours. ABG  Recent Labs Lab 12/04/15 1300 12/04/15 1820 12/05/15 0400  PHART 7.30* 7.33* 7.36  PCO2ART 88* 85* 80*  PO2ART  101 94 113*   Liver Enzymes  Recent Labs Lab 12/03/15 1802  AST 20  ALT 38  ALKPHOS 59  BILITOT 0.6  ALBUMIN 3.3*   Cardiac Enzymes  Recent Labs Lab 12/03/15 1802 12/03/15 2147  TROPONINI 0.55* 0.50*   Glucose  Recent Labs Lab 12/04/15 0737 12/04/15 1127 12/04/15 1612 12/04/15 2129 12/05/15 0713 12/05/15 1109  GLUCAP 165* 205* 110* 109* 111* 171*    Imaging No results found.  LINES:   CULTURES:   ANTIBIOTICS  ASSESSMENT / PLAN: 55 year old female with complex medical history to include obesity hypoventilation syndrome, morbid obesity, pickwickian syndrome, cor pulmonale, diastolic CHF, hypertension, diabetes type 2, seen in consultation for acute hypercarbic respiratory failure secondary to acute on chronic CHF exacerbation.  Hypercarbic respiratory failure-requiring BiPAP -Most likely secondary to acute on chronic CHF exacerbation - Will continue with BiPAP, maintain O2 saturations greater than 88%. Improving today, plan for Bipap in the AM (2-4hrs, min 2hrs), Bipap in the PM (2-4hrs, min 2hrs), and Bipap QHS (min 6 hrs).  -Check ABG PRN -She is a CO2 retainer, mostly likely baseline CO2 in the upper 50s -She does require nocturnal O2 for which she has not been compliant with -Given her level of obesity, obesity hypoventilation syndrome, she might be a better candidate for noninvasive positive pressure ventilation upon discharge, such as CPAP or BiPAP or Triology machine -Her clinical status is moderately improve, pH is now 7.36, however her CO2 is still 80, which she is tolerating well, ironically. I do believe that she still needs more optimization of her CO2 narcosis with continued BiPAP treatment as outlined above -Her CO2 is difficult to control given the multiple comorbidities including diastolic CHF, cor pulmonale, obesity hypoventilation syndrome, and overall patient noncompliance  Cor pulmonale -Most likely due to obesity, questionable OSA/OHS, and  heart failure -Cardiology currently following -Gentle diuresis -Continue supplemental oxygen, especially at night  Pulmonary hypertension -Group 2 (heart disease) and group 3 (pulmonary disorders to include obesity hypoventilation syndrome, questionable obstructive sleep apnea, morbid obesity) -Patient does not have pulmonary arterial hypertension and therefore does not require any forms of prostacyclin, prostaglandins or other forms of vasodilators at this time -Treating the underlying condition such as heart disease and underlying lung or sleep disorders is paramount to maintaining her pulmonary pressures and her heart failure.  Nocturnal Hypoxemia -Patient supposed to be wearing supplemental oxygen at night, but she is not compliant with this - educated patient on the use of nocturnal oxygen, continue to maintain saturations >88% -There is a component of obesity hypoventilation syndrome, and patient may benefit from NIVPPV - this can be discussed closer to discharge.   Acute on chronic diastolic heart failure -Patient has stated she has been noncompliant with some of her meds and her diet -Cardiology is following, recommend gentle diuresis while not to decrease her preload significantly given her level of cor pulmonale -BP management  Hypertension -Toprol-XL, Lasix, PRN hydralazine  Diabetes type 2 -Continue with SSI  Obesity hypoventilation syndrome,? OSA -BiPAP/NIVPPV at night during inpatient stay and possible upon discharge  Social - Spoken to the daughter at length, and the patient out  the above management and plan. Most importantly, today we have agreed that while her mentation is much better, she still has significant CO2 narcosis and will require intermittent BiPAP during the day (as stated above) and at night. Patient and daughter verbalized understanding and agreed with plan.   Thank you for consulting [facility], Please feel free to contacts [place]  with any questions at [phone] (please enter 7-digits).  I have personally obtained a history, examined the patient, evaluated laboratory and imaging results, formulated the assessment and plan and placed orders.  The Patient requires high complexity decision making for assessment and support, frequent evaluation and titration of therapies, application of advanced monitoring technologies and extensive interpretation of multiple databases.  Critical Care Time devoted to patient care services described in this note is 35 minutes.   Overall, patient is critically ill, prognosis is guarded. Patient at high risk for cardiac arrest and death.    Vilinda Boehringer, MD [facility] Pager [phone] (please enter 7-digits) On Call Pager [phone] (please enter 7-digits)  Note: This note was prepared with Dragon dictation along with smaller phrase technology. Any transcriptional errors that result from this process are unintentional.

## 2015-12-06 LAB — BLOOD GAS, ARTERIAL
ACID-BASE EXCESS: 19.8 mmol/L — AB (ref 0.0–3.0)
BICARBONATE: 48.8 meq/L — AB (ref 21.0–28.0)
Expiratory PAP: 10
FIO2: 0.4
Inspiratory PAP: 24
Mechanical Rate: 24
O2 SAT: 93.4 %
PATIENT TEMPERATURE: 37
PCO2 ART: 77 mmHg — AB (ref 32.0–48.0)
PO2 ART: 68 mmHg — AB (ref 83.0–108.0)
pH, Arterial: 7.41 (ref 7.350–7.450)

## 2015-12-06 LAB — BASIC METABOLIC PANEL
ANION GAP: 4 — AB (ref 5–15)
BUN: 26 mg/dL — ABNORMAL HIGH (ref 6–20)
CALCIUM: 8.5 mg/dL — AB (ref 8.9–10.3)
CO2: 39 mmol/L — AB (ref 22–32)
CREATININE: 0.64 mg/dL (ref 0.44–1.00)
Chloride: 98 mmol/L — ABNORMAL LOW (ref 101–111)
Glucose, Bld: 116 mg/dL — ABNORMAL HIGH (ref 65–99)
Potassium: 4.3 mmol/L (ref 3.5–5.1)
Sodium: 141 mmol/L (ref 135–145)

## 2015-12-06 LAB — CBC
HCT: 43.2 % (ref 35.0–47.0)
Hemoglobin: 12.4 g/dL (ref 12.0–16.0)
MCH: 22.9 pg — ABNORMAL LOW (ref 26.0–34.0)
MCHC: 28.6 g/dL — AB (ref 32.0–36.0)
MCV: 79.9 fL — ABNORMAL LOW (ref 80.0–100.0)
PLATELETS: 156 10*3/uL (ref 150–440)
RBC: 5.41 MIL/uL — ABNORMAL HIGH (ref 3.80–5.20)
RDW: 22.5 % — AB (ref 11.5–14.5)
WBC: 8.4 10*3/uL (ref 3.6–11.0)

## 2015-12-06 LAB — GLUCOSE, CAPILLARY
GLUCOSE-CAPILLARY: 109 mg/dL — AB (ref 65–99)
GLUCOSE-CAPILLARY: 139 mg/dL — AB (ref 65–99)
GLUCOSE-CAPILLARY: 173 mg/dL — AB (ref 65–99)
Glucose-Capillary: 124 mg/dL — ABNORMAL HIGH (ref 65–99)

## 2015-12-06 LAB — MAGNESIUM: Magnesium: 2.2 mg/dL (ref 1.7–2.4)

## 2015-12-06 LAB — PHOSPHORUS: PHOSPHORUS: 3.4 mg/dL (ref 2.5–4.6)

## 2015-12-06 MED ORDER — CETYLPYRIDINIUM CHLORIDE 0.05 % MT LIQD
7.0000 mL | Freq: Two times a day (BID) | OROMUCOSAL | Status: DC
  Administered 2015-12-06 – 2015-12-08 (×5): 7 mL via OROMUCOSAL

## 2015-12-06 MED ORDER — FUROSEMIDE 40 MG PO TABS
40.0000 mg | ORAL_TABLET | Freq: Two times a day (BID) | ORAL | Status: DC
  Administered 2015-12-06 – 2015-12-07 (×2): 40 mg via ORAL
  Filled 2015-12-06 (×2): qty 1

## 2015-12-06 NOTE — Progress Notes (Signed)
[facility] [place] NAME: Jodi [name]    MR#:  [id number]  DATE OF BIRTH:  1961-01-23  SUBJECTIVE:  CHIEF COMPLAINT:   Chief Complaint  Patient presents with  . Shortness of Breath   Patient is 56 year old dominant female with medical history significant for history of affective sleep apnea/OHS, obesity, who presents to the hospital with complaints of shortness of breath and lower extremity swelling, which had been progressing over the past one week. Patient admitted of medical and dietary noncompliance including noncompliance with oxygen therapy. On arrival to emergency room, she was noted to have O2 sats in 60s on room air. She admitted of approximately 20-25 pound weight gain. On arrival to emergency room, she was given diuretics and diuresed, she admited of feeling better, however, not able to provide much more history. Since she was  Drooling, poorly responsive, ABGs were checked and she was found to have markedly elevated PCO2 level at 88 and respiratory acidosis, she was transferred to intensive care unit and was initiated on BiPAP. Uncomfortable with BiPAP, intermittently takes it off. The patient's family as well as patient herself are unreasonable in regards to medical care issues, discussed with Dr. Stevenson Clinch Of note, most recent CT angiogram of the chest March 2017 revealed 4.1 cm ascending aortic aneurysm.  Patient feels good today. Denies any shortness of breath, chest pains, discomfort,, Although has intermittent upper abdominal discomfort with meal  intake, has good bowel movements  Review of Systems  Constitutional: Negative for fever, chills and weight loss.  HENT: Negative for congestion.   Eyes: Negative for blurred vision and double vision.  Respiratory: Negative for cough, sputum production, shortness of breath and wheezing.   Cardiovascular: Negative for chest pain, palpitations, orthopnea, leg swelling and PND.   Gastrointestinal: Negative for nausea, vomiting, abdominal pain, diarrhea, constipation and blood in stool.  Genitourinary: Negative for dysuria, urgency, frequency and hematuria.  Musculoskeletal: Negative for falls.  Neurological: Negative for dizziness, tremors, focal weakness and headaches.  Endo/Heme/Allergies: Does not bruise/bleed easily.  Psychiatric/Behavioral: Negative for depression. The patient does not have insomnia.     VITAL SIGNS: Blood pressure 115/71, pulse 71, temperature 98.4 F (36.9 C), temperature source Oral, resp. rate 23, height 5\' 4"  (1.626 m), weight 167.5 kg (369 lb 4.3 oz), SpO2 97 %.  PHYSICAL EXAMINATION:   GENERAL:  56 y.o.-year-old patient sitting in the chair, off BiPAP, his nasal cannulas. Comfortable, not in respiratory distress , alert and communicative, talkative. EYES: Pupils equal, round, reactive to light and accommodation. No scleral icterus. Extraocular muscles intact.  HEENT: Head atraumatic, normocephalic. Oropharynx and nasopharynx clear.  NECK:  Supple, no jugular venous distention. No thyroid enlargement, no tenderness.  LUNGS: Diminished breath sounds bilaterally at bases, especially posteriorly, no wheezing, but few anterior rhonchi noted. Using accessory muscles of respiration, tachypneic, uncomfortable, on BiPAP.  CARDIOVASCULAR: S1, S2 , tachycardic. No murmurs, rubs, or gallops, distant.  ABDOMEN: Soft, nontender, nondistended. Bowel sounds present. No organomegaly or mass.  EXTREMITIES: 3+ lower extremity and pedal edema, no cyanosis, or clubbing.  NEUROLOGIC: Cranial nerves II through XII are grossly intact.  Muscle strength 5/5 in all extremities. Sensation grossly intact. Gait not checked.  PSYCHIATRIC: The patient is alert, comfortable, communicative, in fact, talkative  SKIN: No obvious rash, lesion, or ulcer.   ORDERS/RESULTS REVIEWED:   CBC  Recent Labs Lab 12/03/15 1802 12/04/15 0500 12/06/15 0200  WBC 11.3* 9.2 8.4   HGB 12.8 12.3 12.4  HCT 44.8 42.9 43.2  PLT 169 155 156  MCV 80.9 81.9 79.9*  MCH 23.1* 23.4* 22.9*  MCHC 28.6* 28.5* 28.6*  RDW 22.2* 21.7* 22.5*  LYMPHSABS 1.3  --   --   MONOABS 1.2*  --   --   EOSABS 0.1  --   --   BASOSABS 0.1  --   --    ------------------------------------------------------------------------------------------------------------------  Chemistries   Recent Labs Lab 12/03/15 1802 12/04/15 0500 12/05/15 0413 12/06/15 0200  NA 138 139 143 141  K 4.2 4.4 4.3 4.3  CL 97* 98* 100* 98*  CO2 34* 34* 42* 39*  GLUCOSE 134* 159* 120* 116*  BUN 61* 58* 40* 26*  CREATININE 1.33* 1.15* 0.82 0.64  CALCIUM 8.6* 8.5* 8.8* 8.5*  MG  --   --   --  2.2  AST 20  --   --   --   ALT 38  --   --   --   ALKPHOS 59  --   --   --   BILITOT 0.6  --   --   --    ------------------------------------------------------------------------------------------------------------------ estimated creatinine clearance is 125.2 mL/min (by C-G formula based on Cr of 0.64). ------------------------------------------------------------------------------------------------------------------ No results for input(s): TSH, T4TOTAL, T3FREE, THYROIDAB in the last 72 hours.  Invalid input(s): FREET3  Cardiac Enzymes  Recent Labs Lab 12/03/15 1802 12/03/15 2147  TROPONINI 0.55* 0.50*   ------------------------------------------------------------------------------------------------------------------ Invalid input(s): POCBNP ---------------------------------------------------------------------------------------------------------------  RADIOLOGY: US Venous Img Lower Bilateral  12/05/2015  CLINICAL DATA:  Swelling for 3 weeks. EXAM: BILATERAL LOWER EXTREMITY VENOUS DOPPLER ULTRASOUND TECHNIQUE: Gray-scale sonography with graded compression, as well as color Doppler and duplex ultrasound were performed to evaluate the lower extremity deep venous systems from the level of the common femoral vein  and including the common femoral, femoral, profunda femoral, popliteal and calf veins including the posterior tibial, peroneal and gastrocnemius veins when visible. The superficial great saphenous vein was also interrogated. Spectral Doppler was utilized to evaluate flow at rest and with distal augmentation maneuvers in the common femoral, femoral and popliteal veins. COMPARISON:  None. FINDINGS: RIGHT LOWER EXTREMITY Normal compressibility, augmentation and color Doppler flow in the right common femoral vein, right femoral vein and right popliteal vein. Right profunda femoral vein is patent without thrombus. Limited evaluation of the calf veins due to body habitus and edema. The right saphenofemoral junction is patent. LEFT LOWER EXTREMITY Normal compressibility, augmentation and color Doppler flow in the left common femoral vein, left femoral vein and left popliteal vein. The left saphenofemoral junction is patent. Left profunda femoral vein is patent without thrombus. Limited evaluation of the left calf veins. IMPRESSION: No evidence of deep venous thrombosis. Limited evaluation of the calf veins as described. Electronically Signed   By: Markus Daft M.D.   On: 12/05/2015 11:35    EKG:  Orders placed or performed during the hospital encounter of 12/03/15  . EKG 12-Lead  . EKG 12-Lead  . EKG 12-Lead  . EKG 12-Lead  . EKG 12-Lead  . EKG 12-Lead    ASSESSMENT AND PLAN:  Principal Problem:   Acute on chronic diastolic (congestive) heart failure (HCC) Active Problems:   Pulmonary hypertension (HCC)   Acute respiratory failure with hypoxia and hypercarbia (HCC)   Morbid obesity due to excess calories (HCC)   Nocturnal hypoxemia due to obesity  #1. Acute on chronic respiratory failure with hypoxia and hypercapnia, Off BiPAP at present, continue oxygen, weaning off to room air as tolerated, patient will  benefit from nighttime CPAP/BiPAP/Trilogy machine , pulmonary consultation is appreciated  #2  acute on chronic diastolic CHF, continue Lasix, changed to oral, watching patient's ins and outs, continue Foley catheter , patient is net negative for 6.4 L #3 metabolic encephalopathy, due to hypercapnia, resolved with therapy. #4. Renal insufficiency, improved with decreased Lasix dose, watching ins and outs #5. Elevated troponin, subcutaneous heparin, aspirin, nitroglycerin, appreciate cardiologist input, recommended metoprolol as well as lisinopril, however, patient refused. #6. Leukocytosis, resolved. #7. Diabetes mellitus with hemoglobin A1c 7.7, resumed metformin, continue sliding scale insulin, watching patient's kidney function closely #8. Ascending aortic aneurysm, awaiting for vascular surgery input, patient may need from right and left heart catheterization Management plans discussed with the patient, family and they are in agreement.   DRUG ALLERGIES:  Allergies  Allergen Reactions  . Penicillins Other (See Comments)    Reaction:  Unknown     CODE STATUS:     Code Status Orders        Start     Ordered   12/03/15 1948  Full code   Continuous     12/03/15 1948    Code Status History    Date Active Date Inactive Code Status Order ID Comments User Context   09/15/2015  4:29 AM 09/16/2015  3:45 PM Full Code [id number]  Reubin Milan, MD Inpatient      TOTAL  TIME TAKING CARE OF THIS PATIENT: 40 minutes.  Discussed with Dr. Henri Medal M.D on 12/06/2015 at 12:20 PM  Between 7am to 6pm - Pager - [phone]  After 6pm go to [email or web address] - password [email or web address] [facility]  [facility]  Office  [phone]  CC: Primary care physician; Tomasita Morrow, MD

## 2015-12-06 NOTE — Progress Notes (Signed)
Up to chair during shift. Dyspnic with moving from bed to chair and to [facility].  Urgency with incontinence since foley removed. Denies pain. 93% on 3L nasal cannula. A&Ox4. Sister at bedside. 2A care.

## 2015-12-06 NOTE — Progress Notes (Signed)
Pharmacy ICU Daily Progress Note  CB is a 55yo female admitted 12/03/15 for CHF exacerbation 2/2 dietary and medical noncompliance.   Past medical history of diastolic CHF, morbid obesity, severe pulmonary hypertension, cor pulmonale, obesity hypoventilation syndrome, Pickwickian syndrome, and hypertension.  Of note, patient and patient's family seem to be challenging in regard to receiving medical care. Pt is refusing a lot of medications and procedures. Of note, refusing heparin, metoprolol, etc.  Active pharmacy consults: None  Infectious:  Antimicrobials: None WBC 8.4 Culture Results: MRSA PCR (-)  Electrolytes:  Potassium: 4.3 Magnesium: 2.2 Phosphorus: 3.4 Supplementation plans: none  Pulmonary: Bronchodilators?  Ventilator status:  O2 sat/FiO2:   Current steroids:  Taper plans:   GI:  Constipation PPx:  Feeding status:  LBM:  SUP:   Insulin:  SSI use in 24hrs:  Current Insulin orders: none, metformin 500mg  BID Last 3 CBGs: 116, 120, 159  DVT PPx: heparin 5000 SQ q8hrs, refusing DVT ppx though  Sedation+Pain: Mild and sever pain orders (add moderate?) RASS goal?  GCS Last pain score:  Opioid use in last 24 hrs:   Pressors:  MAP goal:   Medication education/counseling required?

## 2015-12-06 NOTE — Plan of Care (Signed)
Problem: Education: Goal: Knowledge of [facility] Education information/materials will improve Outcome: Progressing Spent 30 minutes w/ pt. And family educating about [facility] and [facility] policies and procedures  Problem: Safety: Goal: Ability to remain free from injury will improve Outcome: Progressing Pt. On bedrest, no injuries sustained during this admission  Problem: Health Behavior/Discharge Planning: Goal: Ability to manage health-related needs will improve Outcome: Not Progressing Pt. Still needs education r/t medications and therapies.  Spent time with pt. And family educating on Rx and Tx's ordered, going over the purposes behind MD's choices w/ a small impact.  Problem: Pain Managment: Goal: General experience of comfort will improve Outcome: Progressing Pt. Slept for over 6 hours w/ the Bipap  Problem: Physical Regulation: Goal: Ability to maintain clinical measurements within normal limits will improve Outcome: Progressing VS WDL  Problem: Activity: Goal: Capacity to carry out activities will improve Outcome: Progressing Pt. Requires maximum assist with position changes but is able to help

## 2015-12-06 NOTE — Care Management (Signed)
Patient with recent discharge observation discharge from [facility].  She acknowledges non compliance to her cardiac and blood pressure meds.  Says she does take her Lasix.  Wear her 02 - when I need it.  After much e;ncouragement to answer she finally says she does not like to take her cardiac and blood pressure meds because of the way it makes her feel.  She says that she has gotten her meds filled but just does not take them.  Her sister provides much support but does not have any influence on persuading patient to take her meds.  She was assessed for [facility] program last month and "not eligible."  Patient is agreeable to home health nursing.  This may help with compliance with one on one education of medical management.  [facility] from [facility].  She resides in [place] and ended up in [facility] as she was visiting in [place].  She is followed by Precious Haws at [facility] in [place].  She says she is current with appointments.  Says her adult adult picks up her meds from drugstore and she has public transportation in [place] for appointments and errands.

## 2015-12-06 NOTE — Progress Notes (Signed)
Up to recliner chair with nurse and walker.  Tolerated well.

## 2015-12-06 NOTE — Progress Notes (Signed)
eLink Physician-Brief Progress Note Patient Name: Jodi [name] DOB: [birth year] MRN: [id number]   Date of Service  12/06/2015  HPI/Events of Note  Bedside nurse notified patient is currently on BiPAP and resting comfortably. Did have brief run of wide-complex & supraventricular tachycardia. Currently patient is in normal sinus rhythm with heart rate 67. BP stable. Saturation 94%. Receive supplemental potassium as scheduled this evening. Her morning potassium greater than 4.0.  eICU Interventions  1. Stat BMP 2. Stat magnesium     Intervention Category Major Interventions: Arrhythmia - evaluation and management  Tera Partridge 12/06/2015, 1:43 AM

## 2015-12-06 NOTE — Progress Notes (Signed)
Pt. Had 5 beats of Vtach into 6 sec of SVT into 12 beats of Vtach- HR went up into 140's and then came back down into 60's on own. Called and consulted Dr. Ashok Cordia @ [place]. MD ordered stat Mag and BMP  Will monitor closely.

## 2015-12-06 NOTE — Consult Note (Signed)
[facility] Consult Note  MRN : [id number]  Jodi [name] is a 55 y.o. (02-21-1961) female who presents with chief complaint of  Chief Complaint  Patient presents with  . Shortness of Breath  .  History of Present Illness: I am asked to see Jodi [name] by Dr.Vaickute for the finding of an ascending aortic aneurysm. Patient is 55 year old female with medical history significant for history of affective sleep apnea/OHS although she denies that she has been diagnosed with this, morbid obesity, who presented to the hospital with complaints of shortness of breath and lower extremity swelling.  Her swelling had been progressing over the past one to 2 weeks. Patient admits to medical and dietary noncompliance including noncompliance with oxygen therapy. On arrival to emergency room, she was noted to have O2 sats in 60s on room air. She admitted of approximately 20-25 pound weight gain. On arrival to emergency room, she was given diuretics, she was admited. ABGs were checked in the ER and she was found to have markedly elevated PCO2 level at 88 and respiratory acidosis, she was transferred to intensive care unit and was initiated on BiPAP. Uncomfortable with BiPAP, she intermittently takes it off.  A CT angiogram of the chest done to rule out pulmonary embolism from March 2017 revealed 4.1 cm ascending aortic aneurysm.   Current Facility-Administered Medications  Medication Dose Route Frequency Provider Last Rate Last Dose  . acetaminophen (TYLENOL) tablet 650 mg  650 mg Oral Q6H PRN Lytle Butte, MD   650 mg at 12/04/15 0406   Or  . acetaminophen (TYLENOL) suppository 650 mg  650 mg Rectal Q6H PRN Lytle Butte, MD      . antiseptic oral rinse (CPC / CETYLPYRIDINIUM CHLORIDE 0.05%) solution 7 mL  7 mL Mouth Rinse BID Theodoro Grist, MD   7 mL at 12/06/15 0200  . ARIPiprazole (ABILIFY) tablet 15 mg  15 mg Oral QHS Lytle Butte, MD   15 mg at 12/05/15 2157  . aspirin  EC tablet 81 mg  81 mg Oral Daily Lytle Butte, MD   81 mg at 12/04/15 [id number]  . divalproex (DEPAKOTE) DR tablet 500 mg  500 mg Oral QHS Lytle Butte, MD   500 mg at 12/05/15 2156  . furosemide (LASIX) tablet 40 mg  40 mg Oral BID Theodoro Grist, MD   40 mg at 12/06/15 1919  . heparin injection 5,000 Units  5,000 Units Subcutaneous 3 times per day Lytle Butte, MD   5,000 Units at 12/03/15 2200  . hydrALAZINE (APRESOLINE) injection 10 mg  10 mg Intravenous Q4H PRN Vishal Mungal, MD      . insulin aspart (novoLOG) injection 0-5 Units  0-5 Units Subcutaneous QHS Lytle Butte, MD   0 Units at 12/03/15 2200  . insulin aspart (novoLOG) injection 0-9 Units  0-9 Units Subcutaneous TID WC Lytle Butte, MD      . ipratropium-albuterol (DUONEB) 0.5-2.5 (3) MG/3ML nebulizer solution 3 mL  3 mL Nebulization Q4H PRN Lytle Butte, MD      . metFORMIN (GLUCOPHAGE) tablet 500 mg  500 mg Oral BID WC Theodoro Grist, MD   500 mg at 12/06/15 1919  . metoprolol succinate (TOPROL-XL) 24 hr tablet 25 mg  25 mg Oral Daily Theodoro Grist, MD   25 mg at 12/04/15 0914  . morphine 2 MG/ML injection 2 mg  2 mg Intravenous Q4H PRN Lytle Butte, MD      .  ondansetron (ZOFRAN) tablet 4 mg  4 mg Oral Q6H PRN Lytle Butte, MD       Or  . ondansetron [facility]) injection 4 mg  4 mg Intravenous Q6H PRN Lytle Butte, MD      . potassium chloride SA (K-DUR,KLOR-CON) CR tablet 20 mEq  20 mEq Oral BID Lytle Butte, MD   20 mEq at 12/06/15 0939  . sodium chloride flush (NS) 0.9 % injection 3 mL  3 mL Intravenous Q12H Lytle Butte, MD   3 mL at 12/06/15 0940    Past Medical History  Diagnosis Date  . Bipolar disorder ([place])   . Chronic back pain   . Chronic knee pain   . Morbid obesity ([place])   . Obesity hypoventilation syndrome ([place])   . Depression   . Diabetes mellitus     Type II  . Moderate to severe pulmonary hypertension ([place])   . Pickwickian syndrome ([place])   . Cor pulmonale (chronic) ([place])   . Hypertension   .  Hyperlipemia   . Thoracic aortic aneurysm ([place])   . Atypical chest pain     Past Surgical History  Procedure Laterality Date  . Right oophorectomy      Social History Social History  Substance Use Topics  . Smoking status: Former Research scientist (life sciences)  . Smokeless tobacco: None  . Alcohol Use: 0.0 oz/week    0 Standard drinks or equivalent per week    Family History Family History  Problem Relation Age of Onset  . Diabetes Other    no family history of bleeding clotting disorders, porphyria or autoimmune disease  Allergies  Allergen Reactions  . Penicillins Other (See Comments)    Reaction:  Unknown      REVIEW OF SYSTEMS (Negative unless checked)  Constitutional: [] Weight loss  [] Fever  [] Chills Cardiac: [] Chest pain   [] Chest pressure   [] Palpitations   [x] Shortness of breath when laying flat   [x] Shortness of breath at rest   [x] Shortness of breath with exertion. Vascular:  [] Pain in legs with walking   [] Pain in legs at rest   [] Pain in legs when laying flat   [] Claudication   [] Pain in feet when walking  [] Pain in feet at rest  [] Pain in feet when laying flat   [] History of DVT   [] Phlebitis   [x] Swelling in legs   [] Varicose veins   [] Non-healing ulcers Pulmonary:   [] Uses home oxygen   [] Productive cough   [] Hemoptysis   [] Wheeze  [] COPD   [] Asthma Neurologic:  [] Dizziness  [] Blackouts   [] Seizures   [] History of stroke   [] History of TIA  [] Aphasia   [] Temporary blindness   [] Dysphagia   [] Weakness or numbness in arms   [] Weakness or numbness in legs Musculoskeletal:  [] Arthritis   [] Joint swelling   [] Joint pain   [] Low back pain Hematologic:  [] Easy bruising  [] Easy bleeding   [] Hypercoagulable state   [] Anemic  [] Hepatitis Gastrointestinal:  [] Blood in stool   [] Vomiting blood  [] Gastroesophageal reflux/heartburn   [] Difficulty swallowing. Genitourinary:  [] Chronic kidney disease   [] Difficult urination  [] Frequent urination  [] Burning with urination   [] Blood in urine Skin:   [] Rashes   [] Ulcers   [] Wounds Psychological:  [] History of anxiety   []  History of major depression.  Physical Examination  Filed Vitals:   12/06/15 0937 12/06/15 1207 12/06/15 1208 12/06/15 1618  BP: 93/75  115/71 101/68  Pulse:  71  74  Temp:  98.4 F (36.9  C)  98.1 F (36.7 C)  TempSrc:  Oral  Oral  Resp:  23  22  Height:      Weight:      SpO2:  97%  97%   Body mass index is 63.35 kg/(m^2). Gen:  WD/WN, NAD Head: [place]/AT, No temporalis wasting. Prominent temp pulse not noted. Ear/Nose/Throat: Hearing grossly intact, nares w/o erythema or drainage, oropharynx w/o Erythema/Exudate Eyes: PERRLA, EOMI.  Neck: Supple, no nuchal rigidity.  No bruit or JVD.  Pulmonary:  Good air movement, clear to auscultation bilaterally.  Cardiac: RRR, normal S1, S2, no Murmurs, rubs or gallops. Vascular: 4+ pitting edema which extends out to the dorsum of her foot and base of her toes no open wounds or sores Vessel Right Left  Radial Palpable Palpable  Ulnar Palpable Palpable  Brachial Palpable Palpable  Carotid Palpable, without bruit Palpable, without bruit  Aorta Not palpable N/A  Femoral Palpable Palpable  Popliteal Palpable Palpable  PT Palpable Palpable  DP Palpable Palpable   Gastrointestinal: soft, non-tender/non-distended. No guarding/reflex. No masses, surgical incisions, or scars. Musculoskeletal: M/S 5/5 throughout.  Extremities without ischemic changes.  No deformity or atrophy.  Neurologic: CN 2-12 intact. Pain and light touch intact in extremities.  Symmetrical.  Speech is fluent. Motor exam as listed above. Psychiatric: Judgment intact, Mood & affect appropriate for pt's clinical situation. Dermatologic: No rashes or ulcers noted.  No cellulitis or open wounds. Lymph : No Cervical, Axillary, or Inguinal lymphadenopathy.    CBC Lab Results  Component Value Date   WBC 8.4 12/06/2015   HGB 12.4 12/06/2015   HCT 43.2 12/06/2015   MCV 79.9* 12/06/2015   PLT 156  12/06/2015    BMET    Component Value Date/Time   NA 141 12/06/2015 0200   NA 143 04/16/2014 1404   K 4.3 12/06/2015 0200   K 3.4* 04/16/2014 1404   CL 98* 12/06/2015 0200   CL 104 04/16/2014 1404   CO2 39* 12/06/2015 0200   CO2 33* 04/16/2014 1404   GLUCOSE 116* 12/06/2015 0200   GLUCOSE 145* 04/16/2014 1404   BUN 26* 12/06/2015 0200   BUN 22* 04/16/2014 1404   CREATININE 0.64 12/06/2015 0200   CREATININE 0.76 04/16/2014 1404   CALCIUM 8.5* 12/06/2015 0200   CALCIUM 8.7 04/16/2014 1404   GFRNONAA >60 12/06/2015 0200   GFRNONAA >60 04/16/2014 1404   GFRAA >60 12/06/2015 0200   GFRAA >60 04/16/2014 1404   Estimated Creatinine Clearance: 125.2 mL/min (by C-G formula based on Cr of 0.64).  COAG No results found for: INR, PROTIME  Radiology Dg Chest 2 View  12/03/2015  CLINICAL DATA:  Shortness of breath for 1 day EXAM: CHEST  2 VIEW COMPARISON:  Chest radiograph November 16, 2015 and chest CT November 18, 2015 FINDINGS: There is no edema or consolidation. Heart is enlarged with mild pulmonary venous hypertension. No demonstrable adenopathy. No bone lesions. IMPRESSION: Evidence of a degree of pulmonary vascular congestion without frank edema or consolidation. Electronically Signed   By: Lowella Grip III M.D.   On: 12/03/2015 19:01   Dg Chest 2 View  11/16/2015  CLINICAL DATA:  Subacute onset of shortness of breath. Bilateral lower extremity swelling. Initial encounter. EXAM: CHEST  2 VIEW COMPARISON:  Chest radiograph performed 09/14/2015 FINDINGS: The lungs are well-aerated. Vascular congestion is noted, with mildly increased interstitial markings, raising question for mild interstitial edema. There is no evidence of focal opacification, pleural effusion or pneumothorax. The heart is enlarged.  No  acute osseous abnormalities are seen. IMPRESSION: Vascular congestion and cardiomegaly, with mildly increased interstitial markings, raising question for mild interstitial edema.  Electronically Signed   By: Garald Balding M.D.   On: 11/16/2015 23:11   Ct Angio Chest Pe W/cm &/or Wo Cm  11/18/2015  CLINICAL DATA:  Acute dyspnea.  Chest pain. EXAM: CT ANGIOGRAPHY CHEST WITH CONTRAST TECHNIQUE: Multidetector CT imaging of the chest was performed using the standard protocol during bolus administration of intravenous contrast. Multiplanar CT image reconstructions and MIPs were obtained to evaluate the vascular anatomy. CONTRAST:  126mL OMNIPAQUE IOHEXOL 350 MG/ML SOLN COMPARISON:  Chest radiograph of November 16, 2015. FINDINGS: Moderate cardiomegaly is noted. No pneumothorax or pleural effusion is noted. Minimal left lower lobe subsegmental atelectasis is noted. Suboptimal enhancement of the pulmonary arteries is noted. There is no definite evidence of large central pulmonary embolus, but smaller peripheral emboli cannot be excluded on the basis of this exam. 4.1 cm ascending thoracic aortic aneurysm is noted. Enlargement of the pulmonary arteries is also noted suggesting pulmonary hypertension. No mediastinal mass or adenopathy is noted. Visualized portion of upper abdomen is unremarkable. No significant osseous abnormality is noted. Review of the MIP images confirms the above findings. IMPRESSION: Suboptimal contrast opacification of pulmonary arteries is noted. No large central pulmonary embolus is noted, but smaller peripheral pulmonary emboli cannot be excluded on the basis of this study. Enlargement of the pulmonary arteries is noted suggesting pulmonary artery hypertension. Moderate cardiomegaly is noted. 4.1 cm ascending thoracic aortic aneurysm. Recommend annual imaging followup by CTA or MRA. This recommendation follows 2010 ACCF/AHA/AATS/ACR/ASA/SCA/SCAI/SIR/STS/SVM Guidelines for the Diagnosis and Management of Patients with Thoracic Aortic Disease. Circulation. 2010; [id number]. Electronically Signed   By: Marijo Conception, M.D.   On: 11/18/2015 15:20   US Venous Img Lower  Bilateral  12/05/2015  CLINICAL DATA:  Swelling for 3 weeks. EXAM: BILATERAL LOWER EXTREMITY VENOUS DOPPLER ULTRASOUND TECHNIQUE: Gray-scale sonography with graded compression, as well as color Doppler and duplex ultrasound were performed to evaluate the lower extremity deep venous systems from the level of the common femoral vein and including the common femoral, femoral, profunda femoral, popliteal and calf veins including the posterior tibial, peroneal and gastrocnemius veins when visible. The superficial great saphenous vein was also interrogated. Spectral Doppler was utilized to evaluate flow at rest and with distal augmentation maneuvers in the common femoral, femoral and popliteal veins. COMPARISON:  None. FINDINGS: RIGHT LOWER EXTREMITY Normal compressibility, augmentation and color Doppler flow in the right common femoral vein, right femoral vein and right popliteal vein. Right profunda femoral vein is patent without thrombus. Limited evaluation of the calf veins due to body habitus and edema. The right saphenofemoral junction is patent. LEFT LOWER EXTREMITY Normal compressibility, augmentation and color Doppler flow in the left common femoral vein, left femoral vein and left popliteal vein. The left saphenofemoral junction is patent. Left profunda femoral vein is patent without thrombus. Limited evaluation of the left calf veins. IMPRESSION: No evidence of deep venous thrombosis. Limited evaluation of the calf veins as described. Electronically Signed   By: Markus Daft M.D.   On: 12/05/2015 11:35    Assessment/Plan #1. Ascending aortic aneurysm 4.1 cm. No intervention at this time. She should be followed intermittently likely with CT scan for growth. If she reaches 6 to 6-1/2 cm then cardiothoracic surgery service will be consult to assess her for repair. I would not expect this for many years. Likely the next time I assess the  ascending aneurysm will be in 1 year with CT scan. #2 Acute on chronic  respiratory failure with hypoxia and hypercapnia, Off BiPAP at present, continue oxygen, weaning off to room air as tolerated, patient will benefit from nighttime CPAP/BiPAP/Trilogy machine , pulmonary consultation #3  cute on chronic diastolic CHF, continue Lasix, changed to oral, watching patient's ins and outs, continue Foley catheter , patient is net negative for 6.4 L  #4. Renal insufficiency, improved with decreased Lasix dose, watching ins and outs #5. Elevated troponin, subcutaneous heparin, aspirin, nitroglycerin, appreciate cardiologist input, recommended metoprolol as well as lisinopril, however, patient refused. #6. Leukocytosis, resolved. #7. Diabetes mellitus with hemoglobin A1c 7.7, resumed metformin, continue sliding scale insulin, watching patient's kidney function closely   Schnier, Dolores Lory, MD  12/06/2015 7:19 PM

## 2015-12-06 NOTE — Progress Notes (Signed)
[facility] Progess Note    ASSESSMENT/PLAN   55 year old female past medical history of diastolic CHF, morbid obesity, severe pulmonary hypertension, cor pulmonale, obesity hypoventilation syndrome, Pickwickian syndrome, hypertension, seen in consultation for hypercarbic respiratory failure. Her course has been complicated by noncompliance with  medications, and recommendations.  Hypercarbic respiratory failure-requiring BiPAP -Most likely secondary to acute on chronic CHF exacerbation, now doing better.  - Continue bipap qhs and prn.  -She is a CO2 retainer, mostly likely baseline CO2 in the upper 50s -She does require nocturnal O2 for which she has not been compliant with -Given her level of obesity, obesity hypoventilation syndrome, she might be a better candidate for noninvasive positive pressure ventilation upon discharge, such as CPAP or BiPAP or Triology machine -Her CO2 is difficult to control given the multiple comorbidities including diastolic CHF, cor pulmonale, obesity hypoventilation syndrome, and overall patient noncompliance  Cor pulmonale -Most likely due to obesity, questionable OSA/OHS, and heart failure -Cardiology currently following -Gentle diuresis -Continue supplemental oxygen, especially at night  Pulmonary hypertension -Group 2 (heart disease) and group 3 (pulmonary disorders to include obesity hypoventilation syndrome, questionable obstructive sleep apnea, morbid obesity) -Patient does not have pulmonary arterial hypertension and therefore does not require any forms of prostacyclin, prostaglandins or other forms of vasodilators at this time -Treating the underlying condition such as heart disease and underlying lung or sleep disorders is paramount to maintaining her pulmonary pressures and her heart failure.  Nocturnal Hypoxemia -Patient supposed to be wearing supplemental oxygen at night, but she is not compliant with this - educated  patient on the use of nocturnal oxygen, continue to maintain saturations >88% -There is a component of obesity hypoventilation syndrome, and patient may benefit from NIVPPV - this can be discussed closer to discharge.   Acute on chronic diastolic heart failure -Patient has stated she has been noncompliant with some of her meds and her diet -Cardiology is following, recommend gentle diuresis while not to decrease her preload significantly given her level of cor pulmonale -BP management  Hypertension -Toprol-XL, Lasix, PRN hydralazine  Diabetes type 2 -Continue with SSI  Obesity hypoventilation syndrome,? OSA -BiPAP/NIVPPV at night during inpatient stay and possible upon discharge  ---------------------------------------   ----------------------------------------   Name: Jodi [name] MRN: [id number] DOB: Dec 28, 1960    ADMISSION DATE:  12/03/2015   SUBJECTIVE:   Pt currently has no new complaints, she notes that her breathing is returning to baseline.  Review of Systems:  Constitutional: Feels well. Cardiovascular: No chest pain.  Pulmonary: Denies dyspnea.   The remainder of systems were reviewed and were found to be negative other than what is documented in the HPI.    VITAL SIGNS: Temp:  [98.3 F (36.8 C)-98.8 F (37.1 C)] 98.3 F (36.8 C) (03/20 0739) Pulse Rate:  [54-80] 80 (03/20 0935) Resp:  [13-25] 22 (03/20 0935) BP: (93-132)/(54-82) 93/75 mmHg (03/20 0937) SpO2:  [84 %-98 %] 96 % (03/20 0935) FiO2 (%):  [40 %] 40 % (03/20 0230) Weight:  [369 lb 4.3 oz (167.5 kg)] 369 lb 4.3 oz (167.5 kg) (03/20 0500) HEMODYNAMICS:   VENTILATOR SETTINGS: Vent Mode:  [-]  FiO2 (%):  [40 %] 40 % INTAKE / OUTPUT:  Intake/Output Summary (Last 24 hours) at 12/06/15 1124 Last data filed at 12/06/15 0600  Gross per 24 hour  Intake    830 ml  Output   3765 ml  Net  -2935 ml    PHYSICAL EXAMINATION: Physical Examination:  VS: BP 93/75 mmHg  Pulse 80   Temp(Src) 98.3 F (36.8 C) (Oral)  Resp 22  Ht 5\' 4"  (1.626 m)  Wt 369 lb 4.3 oz (167.5 kg)  BMI 63.35 kg/m2  SpO2 96%  General Appearance: No distress  Neuro:without focal findings, mental status normal. HEENT: PERRLA, EOM intact. Pulmonary: normal breath sounds  , Decreased bilaterally. CardiovascularNormal S1,S2.  No m/r/g.   Abdomen: Benign, Soft, non-tender. Renal:  No costovertebral tenderness  GU:  Not performed at this time. Endocrine: No evident thyromegaly. Skin:   warm, no rashes, no ecchymosis  Extremities: normal, no cyanosis, clubbing.   LABS:   LABORATORY PANEL:   CBC  Recent Labs Lab 12/06/15 0200  WBC 8.4  HGB 12.4  HCT 43.2  PLT 156    Chemistries   Recent Labs Lab 12/03/15 1802  12/06/15 0200  NA 138  < > 141  K 4.2  < > 4.3  CL 97*  < > 98*  CO2 34*  < > 39*  GLUCOSE 134*  < > 116*  BUN 61*  < > 26*  CREATININE 1.33*  < > 0.64  CALCIUM 8.6*  < > 8.5*  MG  --   --  2.2  PHOS  --   --  3.4  AST 20  --   --   ALT 38  --   --   ALKPHOS 59  --   --   BILITOT 0.6  --   --   < > = values in this interval not displayed.   Recent Labs Lab 12/04/15 2129 12/05/15 0713 12/05/15 1109 12/05/15 1754 12/05/15 2156 12/06/15 0735  GLUCAP 109* 111* 171* 118* 134* 109*    Recent Labs Lab 12/04/15 1820 12/05/15 0400 12/06/15 0516  PHART 7.33* 7.36 7.41  PCO2ART 85* 80* 77*  PO2ART 94 113* 68*    Recent Labs Lab 12/03/15 1802  AST 20  ALT 38  ALKPHOS 59  BILITOT 0.6  ALBUMIN 3.3*    Cardiac Enzymes  Recent Labs Lab 12/03/15 2147  TROPONINI 0.50*    RADIOLOGY:  US Venous Img Lower Bilateral  12/05/2015  CLINICAL DATA:  Swelling for 3 weeks. EXAM: BILATERAL LOWER EXTREMITY VENOUS DOPPLER ULTRASOUND TECHNIQUE: Gray-scale sonography with graded compression, as well as color Doppler and duplex ultrasound were performed to evaluate the lower extremity deep venous systems from the level of the common femoral vein and including  the common femoral, femoral, profunda femoral, popliteal and calf veins including the posterior tibial, peroneal and gastrocnemius veins when visible. The superficial great saphenous vein was also interrogated. Spectral Doppler was utilized to evaluate flow at rest and with distal augmentation maneuvers in the common femoral, femoral and popliteal veins. COMPARISON:  None. FINDINGS: RIGHT LOWER EXTREMITY Normal compressibility, augmentation and color Doppler flow in the right common femoral vein, right femoral vein and right popliteal vein. Right profunda femoral vein is patent without thrombus. Limited evaluation of the calf veins due to body habitus and edema. The right saphenofemoral junction is patent. LEFT LOWER EXTREMITY Normal compressibility, augmentation and color Doppler flow in the left common femoral vein, left femoral vein and left popliteal vein. The left saphenofemoral junction is patent. Left profunda femoral vein is patent without thrombus. Limited evaluation of the left calf veins. IMPRESSION: No evidence of deep venous thrombosis. Limited evaluation of the calf veins as described. Electronically Signed   By: Markus Daft M.D.   On: 12/05/2015 11:35     --Deep Ashby Dawes,  MD.  [facility]  Patricia Pesa, M.D.  Vilinda Boehringer, M.D.  Merton Border, M.D

## 2015-12-07 LAB — BLOOD GAS, ARTERIAL
Acid-Base Excess: 15.7 mmol/L — ABNORMAL HIGH (ref 0.0–3.0)
Allens test (pass/fail): POSITIVE — AB
BICARBONATE: 45.2 meq/L — AB (ref 21.0–28.0)
EXPIRATORY PAP: 10
FIO2: 0.4
INSPIRATORY PAP: 24
O2 SAT: 98.3 %
PCO2 ART: 80 mmHg — AB (ref 32.0–48.0)
PO2 ART: 113 mmHg — AB (ref 83.0–108.0)
Patient temperature: 37
RATE: 24 resp/min
pH, Arterial: 7.36 (ref 7.350–7.450)

## 2015-12-07 LAB — GLUCOSE, CAPILLARY
Glucose-Capillary: 104 mg/dL — ABNORMAL HIGH (ref 65–99)
Glucose-Capillary: 157 mg/dL — ABNORMAL HIGH (ref 65–99)
Glucose-Capillary: 122 mg/dL — ABNORMAL HIGH (ref 65–99)
Glucose-Capillary: 131 mg/dL — ABNORMAL HIGH (ref 65–99)

## 2015-12-07 LAB — MAGNESIUM: Magnesium: 2 mg/dL (ref 1.7–2.4)

## 2015-12-07 MED ORDER — ENOXAPARIN SODIUM 40 MG/0.4ML ~~LOC~~ SOLN
40.0000 mg | Freq: Two times a day (BID) | SUBCUTANEOUS | Status: DC
  Filled 2015-12-07 (×2): qty 0.4

## 2015-12-07 MED ORDER — FUROSEMIDE 40 MG PO TABS
80.0000 mg | ORAL_TABLET | Freq: Two times a day (BID) | ORAL | Status: DC
  Administered 2015-12-07 – 2015-12-09 (×4): 80 mg via ORAL
  Filled 2015-12-07: qty 1
  Filled 2015-12-07 (×3): qty 2

## 2015-12-07 MED ORDER — FUROSEMIDE 10 MG/ML IJ SOLN
40.0000 mg | Freq: Once | INTRAMUSCULAR | Status: AC
  Administered 2015-12-07: 40 mg via INTRAVENOUS
  Filled 2015-12-07: qty 4

## 2015-12-07 NOTE — Progress Notes (Signed)
[facility] Progess Note    ASSESSMENT/PLAN   55 year old female past medical history of diastolic CHF, morbid obesity, severe pulmonary hypertension, cor pulmonale, obesity hypoventilation syndrome, Pickwickian syndrome, hypertension, seen in consultation for hypercarbic respiratory failure. Her course has been complicated by noncompliance with  medications, and recommendations.  Hypercarbic respiratory failure-requiring BiPAP-resp failure improving -Most likely secondary to acute on chronic CHF exacerbation, now doing better.  - Continue bipap qhs and prn.  -She is a CO2 retainer, mostly likely baseline CO2 in the upper 50s -She does require nocturnal O2 for which she has not been compliant with -Given her level of obesity, obesity hypoventilation syndrome, she might be a better candidate for noninvasive positive pressure ventilation upon discharge, such as CPAP or BiPAP or Triology machine -Her CO2 is difficult to control given the multiple comorbidities including diastolic CHF, cor pulmonale, obesity hypoventilation syndrome, and overall patient noncompliance  Cor pulmonale -Most likely due to obesity, questionable OSA/OHS, and heart failure -Cardiology currently following -Gentle diuresis -Continue supplemental oxygen, especially at night  Pulmonary hypertension -Group 2 (heart disease) and group 3 (pulmonary disorders to include obesity hypoventilation syndrome, questionable obstructive sleep apnea, morbid obesity) -Patient does not have pulmonary arterial hypertension and therefore does not require any forms of prostacyclin, prostaglandins or other forms of vasodilators at this time -Treating the underlying condition such as heart disease and underlying lung or sleep disorders is paramount to maintaining her pulmonary pressures and her heart failure.  Nocturnal Hypoxemia -Patient supposed to be wearing supplemental oxygen at night, but she is not compliant  with this - educated patient on the use of nocturnal oxygen, continue to maintain saturations >88% -There is a component of obesity hypoventilation syndrome, and patient may benefit from NIVPPV - this can be discussed closer to discharge.   Acute on chronic diastolic heart failure -Patient has stated she has been noncompliant with some of her meds and her diet -Cardiology is following, recommend gentle diuresis while not to decrease her preload significantly given her level of cor pulmonale -BP management  Hypertension -Toprol-XL, Lasix, PRN hydralazine  Diabetes type 2 -Continue with SSI  Obesity hypoventilation syndrome,? OSA -BiPAP/NIVPPV at night during inpatient stay and possible upon discharge  ---------------------------------------  OK TO [place] ----------------------------------------   Name: Jodi [name] MRN: [id number] DOB: Dec 18, 1960    ADMISSION DATE:  12/03/2015   SUBJECTIVE:   Pt currently has no new complaints, she notes that her breathing is returning to baseline.  Review of Systems:  Constitutional: Feels well. Cardiovascular: No chest pain.  Pulmonary: Denies dyspnea.   The remainder of systems were reviewed and were found to be negative other than what is documented in the HPI.    VITAL SIGNS: Temp:  [98.1 F (36.7 C)-98.6 F (37 C)] 98.2 F (36.8 C) (03/21 1210) Pulse Rate:  [57-79] 73 (03/21 1100) Resp:  [15-24] 15 (03/21 1100) BP: (93-123)/(55-83) 108/58 mmHg (03/21 1100) SpO2:  [89 %-100 %] 100 % (03/21 1100) FiO2 (%):  [40 %] 40 % (03/21 0040) Weight:  [371 lb 4.1 oz (168.4 kg)] 371 lb 4.1 oz (168.4 kg) (03/21 0500) HEMODYNAMICS:   VENTILATOR SETTINGS: Vent Mode:  [-]  FiO2 (%):  [40 %] 40 % INTAKE / OUTPUT:  Intake/Output Summary (Last 24 hours) at 12/07/15 1511 Last data filed at 12/07/15 1302  Gross per 24 hour  Intake    723 ml  Output    151 ml  Net  572 ml    PHYSICAL EXAMINATION: Physical  Examination:   VS: BP 108/58 mmHg  Pulse 73  Temp(Src) 98.2 F (36.8 C) (Oral)  Resp 15  Ht 5\' 4"  (1.626 m)  Wt 371 lb 4.1 oz (168.4 kg)  BMI 63.69 kg/m2  SpO2 100%  General Appearance: No distress  Neuro:without focal findings, mental status normal. HEENT: PERRLA, EOM intact. Pulmonary: normal breath sounds  , Decreased bilaterally. CardiovascularNormal S1,S2.  No m/r/g.   Abdomen: Benign, Soft, non-tender. Renal:  No costovertebral tenderness  GU:  Not performed at this time. Endocrine: No evident thyromegaly. Skin:   warm, no rashes, no ecchymosis  Extremities: normal, no cyanosis, clubbing.   LABS:   LABORATORY PANEL:   CBC  Recent Labs Lab 12/06/15 0200  WBC 8.4  HGB 12.4  HCT 43.2  PLT 156    Chemistries   Recent Labs Lab 12/03/15 1802  12/06/15 0200 12/07/15 1154  NA 138  < > 141  --   K 4.2  < > 4.3  --   CL 97*  < > 98*  --   CO2 34*  < > 39*  --   GLUCOSE 134*  < > 116*  --   BUN 61*  < > 26*  --   CREATININE 1.33*  < > 0.64  --   CALCIUM 8.6*  < > 8.5*  --   MG  --   < > 2.2 2.0  PHOS  --   --  3.4  --   AST 20  --   --   --   ALT 38  --   --   --   ALKPHOS 59  --   --   --   BILITOT 0.6  --   --   --   < > = values in this interval not displayed.   Recent Labs Lab 12/06/15 0735 12/06/15 1205 12/06/15 1626 12/06/15 2122 12/07/15 0743 12/07/15 1222  GLUCAP 109* 139* 124* 173* 104* 157*    Recent Labs Lab 12/04/15 1820 12/05/15 0400 12/06/15 0516  PHART 7.33* 7.36 7.41  PCO2ART 85* 80* 77*  PO2ART 94 113* 68*    Recent Labs Lab 12/03/15 1802  AST 20  ALT 38  ALKPHOS 59  BILITOT 0.6  ALBUMIN 3.3*    Cardiac Enzymes  Recent Labs Lab 12/03/15 2147  TROPONINI 0.50*   I have personally obtained a history, examined the patient, evaluated Pertinent laboratory and RadioGraphic/imaging results, and  formulated the assessment and plan   The Patient requires high complexity decision making for assessment and  support, frequent evaluation and titration of therapies.  Patient satisfied with Plan of action and management. All questions answered  Corrin Parker, M.D.  [facility]  Medical Director [place] Director [facility] Cardio-Pulmonary Department

## 2015-12-07 NOTE — Progress Notes (Signed)
RN discussed in rounds with Dr. Mortimer Fries that patient is in NSR but had run of Vtach this morning which she has had this admission prior to today.  Patient up in chair eating breakfast at this time. Dr. Mortimer Fries gave order for patient to be transferred to any floor with tele monitor.

## 2015-12-07 NOTE — Progress Notes (Addendum)
[facility] [place] NAME: Jodi [name]    MR#:  [id number]  DATE OF BIRTH:  07/10/1961  SUBJECTIVE:  CHIEF COMPLAINT:   Chief Complaint  Patient presents with  . Shortness of Breath   Patient is 55 year old dominant female with medical history significant for history of affective sleep apnea/OHS, obesity, who presents to the hospital with complaints of shortness of breath and lower extremity swelling, which had been progressing over the past one week. Patient admitted of medical and dietary noncompliance including noncompliance with oxygen therapy. On arrival to emergency room, she was noted to have O2 sats in 60s on room air. She admitted of approximately 20-25 pound weight gain. On arrival to emergency room, she was given diuretics and diuresed, she admited of feeling better, however, not able to provide much more history. Since she was   poorly responsive, ABGs were checked and she was found to have markedly elevated PCO2 level at 88 and respiratory acidosis, she was transferred to intensive care unit and was initiated on BiPAP. Now patient is weaned off oxygen and is on 2 L of oxygen through nasal cannula, oxygenation is satisfactory, patient feels good and wants to go home. The patient's nurses are concerned about her not willing to take medications Of note, most recent CT angiogram of the chest March 2017 revealed 4.1 cm ascending aortic aneurysm. The patient was seen by vascular surgery and recommended follow-up. Cardiologist recommends no further intervention or workup, , discussed with Dr. Candis Musa Patient feels good today. Denies any shortness of breath, chest pains, discomfort,   Review of Systems  Constitutional: Negative for fever, chills and weight loss.  HENT: Negative for congestion.   Eyes: Negative for blurred vision and double vision.  Respiratory: Negative for cough, sputum production, shortness of breath and wheezing.    Cardiovascular: Negative for chest pain, palpitations, orthopnea, leg swelling and PND.  Gastrointestinal: Negative for nausea, vomiting, abdominal pain, diarrhea, constipation and blood in stool.  Genitourinary: Negative for dysuria, urgency, frequency and hematuria.  Musculoskeletal: Negative for falls.  Neurological: Negative for dizziness, tremors, focal weakness and headaches.  Endo/Heme/Allergies: Does not bruise/bleed easily.  Psychiatric/Behavioral: Negative for depression. The patient does not have insomnia.     VITAL SIGNS: Blood pressure 108/58, pulse 73, temperature 98.2 F (36.8 C), temperature source Oral, resp. rate 15, height 5\' 4"  (1.626 m), weight 168.4 kg (371 lb 4.1 oz), SpO2 100 %.  PHYSICAL EXAMINATION:   GENERAL:  55 y.o.-year-old patient sitting in the chair, off BiPAP, having nasal cannulas. Comfortable, not in respiratory distress , alert and communicative, talkative. EYES: Pupils equal, round, reactive to light and accommodation. No scleral icterus. Extraocular muscles intact.  HEENT: Head atraumatic, normocephalic. Oropharynx and nasopharynx clear.  NECK:  Supple, no jugular venous distention. No thyroid enlargement, no tenderness.  LUNGS: Diminished breath sounds bilaterally at bases, especially posteriorly, no wheezing, but few anterior rhonchi noted. Using accessory muscles of respiration, tachypneic, uncomfortable, on BiPAP.  CARDIOVASCULAR: S1, S2 , tachycardic. No murmurs, rubs, or gallops, distant.  ABDOMEN: Soft, nontender, nondistended. Bowel sounds present. No organomegaly or mass.  EXTREMITIES: 3+ lower extremity and pedal edema, no cyanosis, or clubbing.  NEUROLOGIC: Cranial nerves II through XII are grossly intact.  Muscle strength 5/5 in all extremities. Sensation grossly intact. Gait not checked.  PSYCHIATRIC: The patient is alert, comfortable, communicative, in fact, talkative  SKIN: No obvious rash, lesion, or ulcer.   ORDERS/RESULTS REVIEWED:    CBC  Recent Labs Lab 12/03/15 1802 12/04/15 0500 12/06/15 0200  WBC 11.3* 9.2 8.4  HGB 12.8 12.3 12.4  HCT 44.8 42.9 43.2  PLT 169 155 156  MCV 80.9 81.9 79.9*  MCH 23.1* 23.4* 22.9*  MCHC 28.6* 28.5* 28.6*  RDW 22.2* 21.7* 22.5*  LYMPHSABS 1.3  --   --   MONOABS 1.2*  --   --   EOSABS 0.1  --   --   BASOSABS 0.1  --   --    ------------------------------------------------------------------------------------------------------------------  Chemistries   Recent Labs Lab 12/03/15 1802 12/04/15 0500 12/05/15 0413 12/06/15 0200 12/07/15 1154  NA 138 139 143 141  --   K 4.2 4.4 4.3 4.3  --   CL 97* 98* 100* 98*  --   CO2 34* 34* 42* 39*  --   GLUCOSE 134* 159* 120* 116*  --   BUN 61* 58* 40* 26*  --   CREATININE 1.33* 1.15* 0.82 0.64  --   CALCIUM 8.6* 8.5* 8.8* 8.5*  --   MG  --   --   --  2.2 2.0  AST 20  --   --   --   --   ALT 38  --   --   --   --   ALKPHOS 59  --   --   --   --   BILITOT 0.6  --   --   --   --    ------------------------------------------------------------------------------------------------------------------ estimated creatinine clearance is 125.7 mL/min (by C-G formula based on Cr of 0.64). ------------------------------------------------------------------------------------------------------------------ No results for input(s): TSH, T4TOTAL, T3FREE, THYROIDAB in the last 72 hours.  Invalid input(s): FREET3  Cardiac Enzymes  Recent Labs Lab 12/03/15 1802 12/03/15 2147  TROPONINI 0.55* 0.50*   ------------------------------------------------------------------------------------------------------------------ Invalid input(s): POCBNP ---------------------------------------------------------------------------------------------------------------  RADIOLOGY: No results found.  EKG:  Orders placed or performed during the hospital encounter of 12/03/15  . EKG 12-Lead  . EKG 12-Lead  . EKG 12-Lead  . EKG 12-Lead  . EKG 12-Lead  .  EKG 12-Lead    ASSESSMENT AND PLAN:  Principal Problem:   Acute on chronic diastolic (congestive) heart failure (HCC) Active Problems:   Pulmonary hypertension (HCC)   Acute respiratory failure with hypoxia and hypercarbia (HCC)   Morbid obesity due to excess calories (HCC)   Nocturnal hypoxemia due to obesity  #1. Acute on chronic respiratory failure with hypoxia and hypercapnia, Off BiPAP , continue oxygen, weaning off to room air as tolerated, patient will benefit from nighttime BiPAP/Trilogy machine , pulmonary consultation is appreciated, discussed with Dr. Mortimer Fries today  #2 acute on chronic diastolic CHF, continue Lasix, changed to oral, urinary output was minimal with 40 mg twice daily dose, advancing to 80 twice daily, watching patient's ins and outs, Foley catheter has been removed, patient is net negative for 6.9 L since admission #3 metabolic encephalopathy, due to hypercapnia, resolved with therapy. #4. Renal insufficiency, improved with decreased Lasix dose, watching ins and outs #5. Elevated troponin, subcutaneous heparin, aspirin, nitroglycerin, appreciate cardiologist input, recommended metoprolol as well as lisinopril, however, patient refused. Getting psychiatrist involved to figure out reasons for refusal.  #6. Leukocytosis, resolved. #7. Diabetes mellitus with hemoglobin A1c 7.7, resumed metformin, continue sliding scale insulin, watching patient's kidney function closely #8. Ascending aortic aneurysm, appreciate  vascular surgery input, follow-up as outpatient was recommended. #9 medical noncompliance, unclear etiology, getting psychiatrist involved for recommendations   Management plans discussed with the patient, family and they are in agreement.   DRUG  ALLERGIES:  Allergies  Allergen Reactions  . Penicillins Other (See Comments)    Reaction:  Unknown     CODE STATUS:     Code Status Orders        Start     Ordered   12/03/15 1948  Full code   Continuous      12/03/15 1948    Code Status History    Date Active Date Inactive Code Status Order ID Comments User Context   09/15/2015  4:29 AM 09/16/2015  3:45 PM Full Code [id number]  Reubin Milan, MD Inpatient      TOTAL  TIME TAKING CARE OF THIS PATIENT: 40 minutes.  Discussed with Dr. Mortimer Fries , Dr. Dolly Rias M.D on 12/07/2015 at 1:19 PM  Between 7am to 6pm - Pager - [phone]  After 6pm go to [email or web address] - password [email or web address] [facility]  [facility]  Office  [phone]  CC: Primary care physician; Tomasita Morrow, MD

## 2015-12-07 NOTE — Consult Note (Signed)
[facility] Face-to-Face Psychiatry Consult   Reason for Consult:  Consult for 55 year old woman with a history of bipolar disorder. Concern about refusal of medication and treatment and mental health. Referring Physician:  vaickute Patient Identification: Jodi [name] MRN:  [id number] Principal Diagnosis: Bipolar disorder [facility]) Diagnosis:   Patient Active Problem List   Diagnosis Date Noted  . Pulmonary hypertension ([place]) [I27.2]   . Acute respiratory failure with hypoxia and hypercarbia (HCC) [J96.01, J96.02]   . Morbid obesity due to excess calories ([place]) [E66.01]   . Nocturnal hypoxemia due to obesity [E66.9, G47.36]   . Acute on chronic diastolic (congestive) heart failure ([place]) [I50.33] 12/03/2015  . CHF (congestive heart failure) ([place]) [I50.9] 11/18/2015  . Non compliance with medical treatment [Z91.19] 09/16/2015  . Bipolar disorder ([place]) [F31.9] 09/15/2015  . Type 2 diabetes mellitus ([place]) [E11.9] 09/15/2015  . Morbid obesity ([place]) [E66.01] 09/15/2015    Total Time spent with patient: 1 hour  Subjective:   Jodi [name] is a 55 y.o. female patient admitted with "they say it's my heart".  HPI:  Patient interviewed. Chart reviewed. Old notes reviewed labs reviewed vitals and medications an MAR sheets reviewed. 55 year old woman with a history of bipolar disorder who is currently in the hospital for symptoms of heart failure. Concern was raised that she is refusing some of her medicine. Patient freely admits that she is refusing to take aspirin and also refusing to take metoprolol. She says the reasons for this are that aspirin in the past made her feel funny and that she doesn't have high blood pressure. As far as her psychiatric symptoms she said her mood recently has been fine. She denies that she has been euphoric agitated anxious or depressed. She sleeps reasonably well at night and her sleep patterns are consistent. She denies having any suicidal or homicidal thoughts. She  denies that she's been having any hallucinations or hyper religious feelings or agitation. She says she has been compliant with her current medicines which are Abilify and Depakote prescribed by her outpatient doctor in [place]. Denies use of alcohol or drugs. Has not been back in the hospital since she was here in 2015.  Medical history: Patient is morbidly obese which has been true for years. She has a history of heart failure which sounds like it may be getting worse. Has a history of diabetes. She has been noncompliant repeatedly with both psychiatric and medical treatment. Doesn't use her oxygen at night probably not as cooperative with medicine as she should be.  Substance abuse history: Denies alcohol or drug use. No past alcohol or drug use problems.  Family history: No known family history of mental health problems  Social history: Currently living with her sister and daughter in [place]. Content with her home life. Says she has outpatient care locally there.  Past Psychiatric History: Patient has a long history of bipolar disorder and when she presents to the hospital it has usually been with a manic episode. When manic she would get agitated with singing and dancing chanting hyper religious behavior agitation thought disorder. Doesn't appear to of ever had a very severe depression and there is no history of suicide attempts. Has had a history of noncompliance with medication in the past. Has had lengthy hospitalizations in the more distant past.  Risk to Self: Is patient at risk for suicide?: No Risk to Others:   Prior Inpatient Therapy:   Prior Outpatient Therapy:    Past Medical History:  Past Medical History  Diagnosis Date  . Bipolar disorder ([place])   . Chronic back pain   . Chronic knee pain   . Morbid obesity ([place])   . Obesity hypoventilation syndrome ([place])   . Depression   . Diabetes mellitus     Type II  . Moderate to severe pulmonary hypertension ([place])   .  Pickwickian syndrome ([place])   . Cor pulmonale (chronic) ([place])   . Hypertension   . Hyperlipemia   . Thoracic aortic aneurysm ([place])   . Atypical chest pain     Past Surgical History  Procedure Laterality Date  . Right oophorectomy     Family History:  Family History  Problem Relation Age of Onset  . Diabetes Other    Family Psychiatric  History: Denies any family history of bipolar disorder or other mental illness Social History:  History  Alcohol Use  . 0.0 oz/week  . 0 Standard drinks or equivalent per week     History  Drug Use  . 2.00 per week    Social History   Social History  . Marital Status: Single    Spouse Name: N/A  . Number of Children: N/A  . Years of Education: N/A   Social History Main Topics  . Smoking status: Former Research scientist (life sciences)  . Smokeless tobacco: None  . Alcohol Use: 0.0 oz/week    0 Standard drinks or equivalent per week  . Drug Use: 2.00 per week  . Sexual Activity: Not Asked   Other Topics Concern  . None   Social History Narrative   Additional Social History:    Allergies:   Allergies  Allergen Reactions  . Penicillins Other (See Comments)    Reaction:  Unknown     Labs:  Results for orders placed or performed during the hospital encounter of 12/03/15 (from the past 48 hour(s))  Glucose, capillary     Status: Abnormal   Collection Time: 12/05/15  5:54 PM  Result Value Ref Range   Glucose-Capillary 118 (H) 65 - 99 mg/dL  Glucose, capillary     Status: Abnormal   Collection Time: 12/05/15  9:56 PM  Result Value Ref Range   Glucose-Capillary 134 (H) 65 - 99 mg/dL  CBC     Status: Abnormal   Collection Time: 12/06/15  2:00 AM  Result Value Ref Range   WBC 8.4 3.6 - 11.0 K/uL   RBC 5.41 (H) 3.80 - 5.20 MIL/uL   Hemoglobin 12.4 12.0 - 16.0 g/dL    Comment: RESULT REPEATED AND VERIFIED   HCT 43.2 35.0 - 47.0 %    Comment: RESULT REPEATED AND VERIFIED   MCV 79.9 (L) 80.0 - 100.0 fL   MCH 22.9 (L) 26.0 - 34.0 pg   MCHC 28.6 (L)  32.0 - 36.0 g/dL   RDW 22.5 (H) 11.5 - 14.5 %   Platelets 156 150 - 440 K/uL  Basic metabolic panel     Status: Abnormal   Collection Time: 12/06/15  2:00 AM  Result Value Ref Range   Sodium 141 135 - 145 mmol/L   Potassium 4.3 3.5 - 5.1 mmol/L   Chloride 98 (L) 101 - 111 mmol/L   CO2 39 (H) 22 - 32 mmol/L   Glucose, Bld 116 (H) 65 - 99 mg/dL   BUN 26 (H) 6 - 20 mg/dL   Creatinine, Ser 0.64 0.44 - 1.00 mg/dL   Calcium 8.5 (L) 8.9 - 10.3 mg/dL   GFR calc non Af Amer >60 >60 mL/min   GFR  calc Af Amer >60 >60 mL/min    Comment: (NOTE) The eGFR has been calculated using the CKD EPI equation. This calculation has not been validated in all clinical situations. eGFR's persistently <60 mL/min signify possible Chronic Kidney Disease.    Anion gap 4 (L) 5 - 15  Magnesium     Status: None   Collection Time: 12/06/15  2:00 AM  Result Value Ref Range   Magnesium 2.2 1.7 - 2.4 mg/dL  Phosphorus     Status: None   Collection Time: 12/06/15  2:00 AM  Result Value Ref Range   Phosphorus 3.4 2.5 - 4.6 mg/dL  Blood gas, arterial     Status: Abnormal   Collection Time: 12/06/15  5:16 AM  Result Value Ref Range   FIO2 0.40    Inspiratory PAP 24    Expiratory PAP 10    pH, Arterial 7.41 7.350 - 7.450   pCO2 arterial 77 (HH) 32.0 - 48.0 mmHg    Comment: CRITICAL RESULT CALLED TO, READ BACK BY AND VERIFIED WITH: DR Milinda Hirschfeld [phone]    pO2, Arterial 68 (L) 83.0 - 108.0 mmHg   Bicarbonate 48.8 (H) 21.0 - 28.0 mEq/L   Acid-Base Excess 19.8 (H) 0.0 - 3.0 mmol/L   O2 Saturation 93.4 %   Patient temperature 37.0    Collection site RIGHT RADIAL    Sample type ARTERIAL DRAW    Allens test (pass/fail) PASS PASS   Mechanical Rate 24   Glucose, capillary     Status: Abnormal   Collection Time: 12/06/15  7:35 AM  Result Value Ref Range   Glucose-Capillary 109 (H) 65 - 99 mg/dL  Glucose, capillary     Status: Abnormal   Collection Time: 12/06/15 12:05 PM  Result Value Ref Range    Glucose-Capillary 139 (H) 65 - 99 mg/dL  Glucose, capillary     Status: Abnormal   Collection Time: 12/06/15  4:26 PM  Result Value Ref Range   Glucose-Capillary 124 (H) 65 - 99 mg/dL  Glucose, capillary     Status: Abnormal   Collection Time: 12/06/15  9:22 PM  Result Value Ref Range   Glucose-Capillary 173 (H) 65 - 99 mg/dL  Glucose, capillary     Status: Abnormal   Collection Time: 12/07/15  7:43 AM  Result Value Ref Range   Glucose-Capillary 104 (H) 65 - 99 mg/dL  Magnesium     Status: None   Collection Time: 12/07/15 11:54 AM  Result Value Ref Range   Magnesium 2.0 1.7 - 2.4 mg/dL  Glucose, capillary     Status: Abnormal   Collection Time: 12/07/15 12:22 PM  Result Value Ref Range   Glucose-Capillary 157 (H) 65 - 99 mg/dL  Glucose, capillary     Status: Abnormal   Collection Time: 12/07/15  4:39 PM  Result Value Ref Range   Glucose-Capillary 122 (H) 65 - 99 mg/dL    Current Facility-Administered Medications  Medication Dose Route Frequency Provider Last Rate Last Dose  . acetaminophen (TYLENOL) tablet 650 mg  650 mg Oral Q6H PRN Lytle Butte, MD   650 mg at 12/04/15 0406   Or  . acetaminophen (TYLENOL) suppository 650 mg  650 mg Rectal Q6H PRN Lytle Butte, MD      . antiseptic oral rinse (CPC / CETYLPYRIDINIUM CHLORIDE 0.05%) solution 7 mL  7 mL Mouth Rinse BID Theodoro Grist, MD   7 mL at 12/06/15 2200  . ARIPiprazole (ABILIFY) tablet 15 mg  15 mg  Oral QHS Lytle Butte, MD   15 mg at 12/06/15 2218  . aspirin EC tablet 81 mg  81 mg Oral Daily Lytle Butte, MD   81 mg at 12/04/15 7017  . divalproex (DEPAKOTE) DR tablet 500 mg  500 mg Oral QHS Lytle Butte, MD   500 mg at 12/06/15 2218  . enoxaparin (LOVENOX) injection 40 mg  40 mg Subcutaneous Q12H Theodoro Grist, MD      . furosemide (LASIX) tablet 80 mg  80 mg Oral BID Theodoro Grist, MD      . hydrALAZINE (APRESOLINE) injection 10 mg  10 mg Intravenous Q4H PRN Vishal Mungal, MD      . insulin aspart (novoLOG)  injection 0-5 Units  0-5 Units Subcutaneous QHS Lytle Butte, MD   0 Units at 12/03/15 2200  . insulin aspart (novoLOG) injection 0-9 Units  0-9 Units Subcutaneous TID WC Lytle Butte, MD      . ipratropium-albuterol (DUONEB) 0.5-2.5 (3) MG/3ML nebulizer solution 3 mL  3 mL Nebulization Q4H PRN Lytle Butte, MD      . metFORMIN (GLUCOPHAGE) tablet 500 mg  500 mg Oral BID WC Theodoro Grist, MD   500 mg at 12/07/15 1019  . metoprolol succinate (TOPROL-XL) 24 hr tablet 25 mg  25 mg Oral Daily Theodoro Grist, MD   25 mg at 12/04/15 0914  . morphine 2 MG/ML injection 2 mg  2 mg Intravenous Q4H PRN Lytle Butte, MD      . ondansetron [facility]) tablet 4 mg  4 mg Oral Q6H PRN Lytle Butte, MD       Or  . ondansetron [facility]) injection 4 mg  4 mg Intravenous Q6H PRN Lytle Butte, MD      . potassium chloride SA (K-DUR,KLOR-CON) CR tablet 20 mEq  20 mEq Oral BID Lytle Butte, MD   20 mEq at 12/07/15 1020  . sodium chloride flush (NS) 0.9 % injection 3 mL  3 mL Intravenous Q12H Lytle Butte, MD   3 mL at 12/07/15 1000    Musculoskeletal: Strength & Muscle Tone: decreased Gait & Station: unsteady Patient leans: N/A  Psychiatric Specialty Exam: Review of Systems  HENT: Negative.   Eyes: Negative.   Respiratory: Negative.   Cardiovascular: Positive for orthopnea and leg swelling.  Gastrointestinal: Negative.   Musculoskeletal: Negative.   Skin: Negative.   Neurological: Positive for weakness.  Psychiatric/Behavioral: Negative for depression, suicidal ideas, hallucinations, memory loss and substance abuse. The patient is not nervous/anxious and does not have insomnia.     Blood pressure 115/57, pulse 82, temperature 98.4 F (36.9 C), temperature source Oral, resp. rate 26, height '5\' 4"'  (1.626 m), weight 168.4 kg (371 lb 4.1 oz), SpO2 96 %.Body mass index is 63.69 kg/(m^2).  General Appearance: Casual  Eye Contact::  Fair  Speech:  Slow  Volume:  Decreased  Mood:  Euthymic  Affect:   Congruent  Thought Process:  Goal Directed  Orientation:  Full (Time, Place, and Person)  Thought Content:  Negative  Suicidal Thoughts:  No  Homicidal Thoughts:  No  Memory:  Immediate;   Fair Recent;   Fair Remote;   Fair  Judgement:  Impaired  Insight:  Fair  Psychomotor Activity:  Decreased  Concentration:  Fair  Recall:  [employer] of Knowledge:Fair  Language: Fair  Akathisia:  No  Handed:  Right  AIMS (if indicated):     Assets:  Desire for Improvement  Financial Resources/Insurance Housing Social Support  ADL's:  Impaired  Cognition: WNL  Sleep:      Treatment Plan Summary: Daily contact with patient to assess and evaluate symptoms and progress in treatment, Medication management and Plan 55 year old woman with a history of bipolar disorder. Currently she does not have any of her typical manic symptoms and doesn't endorse depressive symptoms. Unfortunately, persons with chronic mental illnesses sometimes may still have a baseline lack of rationality even when they are not having florid and dangerous episodes. Patient seems to have developed her own theories about why she should or should not take particular medications. I'm very dubious of this complaint that aspirin caused her to feel funny in the past. We have a well documented in her old chart that she's been on aspirin for years without complaint. I spent some time with her trying to explain heart failure. I tried to explain how medicines for "blood pressure" were used in persons with heart failure to decrease the load on the heart even if the blood pressure number itself were not necessarily high. I encouraged the patient to reconsider taking aspirin after explaining to her the way in which it can be life saving. She apparently had not understood that completely and she still wouldn't commit to trying the medicine again. I encouraged her to please discuss medicines that she was declining with her doctors because the physicians  here do know what they are doing and probably have a very good reason for her medicine and could explain it to her if she is uncertain. I see that she is getting her Abilify and Depakote. I will not change the doses of her medicine for now. Doesn't require inpatient psychiatric treatment. I will continue to follow-up as needed.  Disposition: Patient does not meet criteria for psychiatric inpatient admission. Supportive therapy provided about ongoing stressors.  Alethia Berthold, MD 12/07/2015 5:27 PM

## 2015-12-07 NOTE — Progress Notes (Signed)
Chaplain rounded unit and provided a compassionate presence and support with prayer to patient. Jodi [name] [phone]

## 2015-12-08 DIAGNOSIS — F3173 Bipolar disorder, in partial remission, most recent episode manic: Secondary | ICD-10-CM

## 2015-12-08 LAB — GLUCOSE, CAPILLARY
GLUCOSE-CAPILLARY: 90 mg/dL (ref 65–99)
GLUCOSE-CAPILLARY: 130 mg/dL — AB (ref 65–99)
GLUCOSE-CAPILLARY: 111 mg/dL — AB (ref 65–99)
Glucose-Capillary: 110 mg/dL — ABNORMAL HIGH (ref 65–99)

## 2015-12-08 MED ORDER — ACETAZOLAMIDE 250 MG PO TABS
250.0000 mg | ORAL_TABLET | Freq: Once | ORAL | Status: AC
  Administered 2015-12-08: 250 mg via ORAL
  Filled 2015-12-08: qty 1

## 2015-12-08 MED ORDER — METOPROLOL SUCCINATE ER 25 MG PO TB24
12.5000 mg | ORAL_TABLET | Freq: Every day | ORAL | Status: DC
  Filled 2015-12-08 (×2): qty 1

## 2015-12-08 NOTE — Progress Notes (Signed)
[place] [facility] Medicine Progess Note    ASSESSMENT/PLAN   55 year old female past medical history of diastolic CHF, morbid obesity, severe pulmonary hypertension, cor pulmonale, obesity hypoventilation syndrome, Pickwickian syndrome, hypertension, seen in consultation for hypercarbic respiratory failure. Her course has been complicated by noncompliance with  medications, and recommendations.  Hypercarbic respiratory failure-requiring BiPAP-resp failure improving -Most likely secondary to acute on chronic CHF exacerbation, now doing better.  - Continue bipap qhs and prn while inpatient.  -She is a CO2 retainer, mostly likely baseline CO2 in the upper 50s -She does require nocturnal O2 for which she has not been compliant with -Given her level of obesity, obesity hypoventilation syndrome, she might be a better candidate for noninvasive positive pressure ventilation upon discharge, such as CPAP or BiPAP or Triology machine -Will need outpatient follow-up for a sleep study, and likely BiPAP titration.  Cor pulmonale -Most likely due to obesity, questionable OSA/OHS, and heart failure -Cardiology currently following -Gentle diuresis -Continue supplemental oxygen, especially at night  Pulmonary hypertension -Group 2 (heart disease) and group 3 (pulmonary disorders to include obesity hypoventilation syndrome, questionable obstructive sleep apnea, morbid obesity) -Patient does not have pulmonary arterial hypertension and therefore does not require any forms of prostacyclin, prostaglandins or other forms of vasodilators at this time -Treating the underlying condition such as heart disease and underlying lung or sleep disorders is paramount to maintaining her pulmonary pressures and her heart failure.  Nocturnal Hypoxemia -Patient supposed to be wearing supplemental oxygen at night, but she is not compliant with this - educated patient on the use of nocturnal oxygen, continue to  maintain saturations >88% -There is a component of obesity hypoventilation syndrome, and patient may benefit from NIVPPV - this can be discussed closer to discharge.   Acute on chronic diastolic heart failure -Patient has stated she has been noncompliant with some of her meds and her diet -Cardiology is following, recommend gentle diuresis while not to decrease her preload significantly given her level of cor pulmonale -BP management   Obesity hypoventilation syndrome,? OSA -BiPAP/NIVPPV at night during inpatient stay follow up for sleep study upon discharge.   Pulmonary service will sign off for now, please call if there are any further questions or concerns.  Name: Jodi [name] MRN: [id number] DOB: 08/13/1961    ADMISSION DATE:  12/03/2015   SUBJECTIVE:   Pt currently has no new complaints, she notes that her breathing is returning to baseline. She is in good spirits today, sitting up in a chair and conversational.  Review of Systems:  Constitutional: Feels well. Cardiovascular: No chest pain.  Pulmonary: Denies dyspnea.   The remainder of systems were reviewed and were found to be negative other than what is documented in the HPI.    VITAL SIGNS: Temp:  [98.4 F (36.9 C)-98.7 F (37.1 C)] 98.4 F (36.9 C) (03/22 0738) Pulse Rate:  [69-82] 75 (03/22 0740) Resp:  [7-26] 19 (03/22 0738) BP: (93-115)/(46-64) 95/50 mmHg (03/22 0740) SpO2:  [93 %-97 %] 93 % (03/22 0740) FiO2 (%):  [40 %] 40 % (03/22 0345) Weight:  [372 lb 9.6 oz (169.01 kg)] 372 lb 9.6 oz (169.01 kg) (03/22 1222) HEMODYNAMICS:   VENTILATOR SETTINGS: Vent Mode:  [-]  FiO2 (%):  [40 %] 40 % INTAKE / OUTPUT:  Intake/Output Summary (Last 24 hours) at 12/08/15 1348 Last data filed at 12/08/15 0740  Gross per 24 hour  Intake    483 ml  Output      0 ml  Net    483 ml    PHYSICAL EXAMINATION: Physical Examination:   VS: BP 95/50 mmHg  Pulse 75  Temp(Src) 98.4 F (36.9 C) (Oral)  Resp 19   Ht 5\' 4"  (1.626 m)  Wt 372 lb 9.6 oz (169.01 kg)  BMI 63.93 kg/m2  SpO2 93%  General Appearance: No distress  Neuro:without focal findings, mental status normal. HEENT: PERRLA, EOM intact. Pulmonary: normal breath sounds  , Decreased bilaterally. CardiovascularNormal S1,S2.  No m/r/g.   Abdomen: Benign, Soft, non-tender. Renal:  No costovertebral tenderness  GU:  Not performed at this time. Endocrine: No evident thyromegaly. Skin:   warm, no rashes, no ecchymosis  Extremities: normal, no cyanosis, clubbing.   LABS:   LABORATORY PANEL:   CBC  Recent Labs Lab 12/06/15 0200  WBC 8.4  HGB 12.4  HCT 43.2  PLT 156    Chemistries   Recent Labs Lab 12/03/15 1802  12/06/15 0200 12/07/15 1154  NA 138  < > 141  --   K 4.2  < > 4.3  --   CL 97*  < > 98*  --   CO2 34*  < > 39*  --   GLUCOSE 134*  < > 116*  --   BUN 61*  < > 26*  --   CREATININE 1.33*  < > 0.64  --   CALCIUM 8.6*  < > 8.5*  --   MG  --   < > 2.2 2.0  PHOS  --   --  3.4  --   AST 20  --   --   --   ALT 38  --   --   --   ALKPHOS 59  --   --   --   BILITOT 0.6  --   --   --   < > = values in this interval not displayed.   Recent Labs Lab 12/07/15 0743 12/07/15 1222 12/07/15 1639 12/07/15 2116 12/08/15 0736 12/08/15 1132  GLUCAP 104* 157* 122* 131* 90 130*    Recent Labs Lab 12/04/15 1820 12/05/15 0400 12/06/15 0516  PHART 7.33* 7.36 7.41  PCO2ART 85* 80* 77*  PO2ART 94 113* 68*    Recent Labs Lab 12/03/15 1802  AST 20  ALT 38  ALKPHOS 59  BILITOT 0.6  ALBUMIN 3.3*    Cardiac Enzymes  Recent Labs Lab 12/03/15 2147  TROPONINI 0.50*   Marda Stalker, M.D.  12/08/2015

## 2015-12-08 NOTE — Care Management (Addendum)
Note received for patient that she may be appropriate for home health RN. Patient (per RN) was up to bedside commode, lower extremity swelling, and refusing to take cardiac Rx today. Per RN patient has capacity to make decisions. Patient is not appropriate for behavior health inpatient per Dr. Weber Cooks and is not exerting any behaviors other than refusing to take Rx. CSW referral placed for outpatient resources to assist patient with behavioral modifications to help improve health. RNCM will continue to follow.

## 2015-12-08 NOTE — Plan of Care (Signed)
Problem: Safety: Goal: Ability to remain free from injury will improve Outcome: Progressing Pt alert and orient. Able to make needs known. Calls with needs

## 2015-12-08 NOTE — Clinical Social Work Note (Signed)
Clinical Social Work Assessment  Patient Details  Name: Jodi [name] MRN: [id number] Date of Birth: Apr 01, 1961  Date of referral:  12/08/15               Reason for consult:  Mental Health Concerns                Permission sought to share information with:    Permission granted to share information::     Name::        Agency::     Relationship::     Contact Information:     Housing/Transportation Living arrangements for the past 2 months:  Single Family Home Source of Information:  Patient, Other (Comment Required) (Sister Jodi [name]. ) Patient Interpreter Needed:  None Criminal Activity/Legal Involvement Pertinent to Current Situation/Hospitalization:  No - Comment as needed Significant Relationships:  Siblings Lives with:  Siblings Do you feel safe going back to the place where you live?  Yes Need for family participation in patient care:  Yes (Comment)  Care giving concerns:  Patient lives in [place] with her sister Jodi [name].    Social Worker assessment / plan:  Holiday representative (CSW) received consult from RN Case Manager to provide patient with outpatient mental health resources. CSW met with patient and her sister Jodi [name] was at bedside. CSW introduced self and explained role of CSW department. Patient was alert and oriented and sitting up in the chair. Patient reported that she lives in [place] with her sister Jodi [name] and was in [place] visiting. CSW inquired if patient was seeing a counselor or psychiatrist and patient stated that information was confidential. Patient reported that she is seeing a mental health counselor but would not chare the name. Patient denied other needs or concerns at this time. Patient reported that she is on chronic oxygen at home through [place]. RN Case Manager is aware of above. Please reconsult if future social work needs arise. CSW signing off.     Employment status:  Disabled (Comment on whether or not currently  receiving Disability), Part-Time Insurance information:  Medicare PT Recommendations:  Not assessed at this time Information / Referral to community resources:  Other (Comment Required) (Patient refused outpatient mental health resources.  )  Patient/Family's Response to care:  Patient denies needs or concerns at this time.   Patient/Family's Understanding of and Emotional Response to Diagnosis, Current Treatment, and Prognosis:  Patient and sister were pleasant.  Emotional Assessment Appearance:  Appears stated age Attitude/Demeanor/Rapport:  Guarded Affect (typically observed):  Pleasant Orientation:  Oriented to Self, Oriented to Place, Oriented to  Time, Oriented to Situation Alcohol / Substance use:  Not Applicable Psych involvement (Current and /or in the community):  No (Comment)  Discharge Needs  Concerns to be addressed:  Patient refuses services Readmission within the last 30 days:  No Current discharge risk:  None Barriers to Discharge:  No Barriers Identified   Loralyn Freshwater, LCSW 12/08/2015, 3:36 PM

## 2015-12-08 NOTE — Consult Note (Signed)
[facility] Face-to-Face Psychiatry Consult   Reason for Consult:  Consult for 55 year old woman with a history of bipolar disorder. Concern about refusal of medication and treatment and mental health. Referring Physician:  [place]ute Patient Identification: Jodi [name] MRN:  [id number] Principal Diagnosis: Bipolar disorder [facility]) Diagnosis:   Patient Active Problem List   Diagnosis Date Noted  . Pulmonary hypertension ([place]) [I27.2]   . Acute respiratory failure with hypoxia and hypercarbia (HCC) [J96.01, J96.02]   . Morbid obesity due to excess calories ([place]) [E66.01]   . Nocturnal hypoxemia due to obesity [E66.9, G47.36]   . Acute on chronic diastolic (congestive) heart failure ([place]) [I50.33] 12/03/2015  . CHF (congestive heart failure) ([place]) [I50.9] 11/18/2015  . Non compliance with medical treatment [Z91.19] 09/16/2015  . Bipolar disorder ([place]) [F31.9] 09/15/2015  . Type 2 diabetes mellitus ([place]) [E11.9] 09/15/2015  . Morbid obesity ([place]) [E66.01] 09/15/2015    Total Time spent with patient: 20 minutes  Subjective:   Jodi [name] is a 55 y.o. female patient admitted with "they say it's my heart".  Follow-up Wednesday the 22nd. Patient moved out of the critical care unit. She has no new complaints today. States that her mood continues to feel fine. Denies any symptoms of agitation or depression. Medically he appears to be improving. She continues to refuse to take aspirin or Lovenox or metoprolol or insulin. On the other hand her blood pressure is running a little on the low side, and her blood sugars are close to 100 even without the sliding scale so I can see why that might be. I did spend some time yesterday explaining the use of medicines for heart failure and the patient nevertheless is continuing to decline to take them but I don't think that it anyway this can be construed as a wish to harm herself rather an idiosyncratic preference. Patient appears to have the capacity to make  these decisions. Not showing signs of mania and she is cooperative with psychiatric medicine.    HPI:  Patient interviewed. Chart reviewed. Old notes reviewed labs reviewed vitals and medications an MAR sheets reviewed. 55 year old woman with a history of bipolar disorder who is currently in the hospital for symptoms of heart failure. Concern was raised that she is refusing some of her medicine. Patient freely admits that she is refusing to take aspirin and also refusing to take metoprolol. She says the reasons for this are that aspirin in the past made her feel funny and that she doesn't have high blood pressure. As far as her psychiatric symptoms she said her mood recently has been fine. She denies that she has been euphoric agitated anxious or depressed. She sleeps reasonably well at night and her sleep patterns are consistent. She denies having any suicidal or homicidal thoughts. She denies that she's been having any hallucinations or hyper religious feelings or agitation. She says she has been compliant with her current medicines which are Abilify and Depakote prescribed by her outpatient doctor in [place]. Denies use of alcohol or drugs. Has not been back in the hospital since she was here in 2015.  Medical history: Patient is morbidly obese which has been true for years. She has a history of heart failure which sounds like it may be getting worse. Has a history of diabetes. She has been noncompliant repeatedly with both psychiatric and medical treatment. Doesn't use her oxygen at night probably not as cooperative with medicine as she should be.  Substance abuse history: Denies alcohol or drug  use. No past alcohol or drug use problems.  Family history: No known family history of mental health problems  Social history: Currently living with her sister and daughter in [place]. Content with her home life. Says she has outpatient care locally there.  Past Psychiatric History: Patient has a long  history of bipolar disorder and when she presents to the hospital it has usually been with a manic episode. When manic she would get agitated with singing and dancing chanting hyper religious behavior agitation thought disorder. Doesn't appear to of ever had a very severe depression and there is no history of suicide attempts. Has had a history of noncompliance with medication in the past. Has had lengthy hospitalizations in the more distant past.  Risk to Self: Is patient at risk for suicide?: No Risk to Others:   Prior Inpatient Therapy:   Prior Outpatient Therapy:    Past Medical History:  Past Medical History  Diagnosis Date  . Bipolar disorder ([place])   . Chronic back pain   . Chronic knee pain   . Morbid obesity ([place])   . Obesity hypoventilation syndrome ([place])   . Depression   . Diabetes mellitus     Type II  . Moderate to severe pulmonary hypertension ([place])   . Pickwickian syndrome ([place])   . Cor pulmonale (chronic) ([place])   . Hypertension   . Hyperlipemia   . Thoracic aortic aneurysm ([place])   . Atypical chest pain     Past Surgical History  Procedure Laterality Date  . Right oophorectomy     Family History:  Family History  Problem Relation Age of Onset  . Diabetes Other    Family Psychiatric  History: Denies any family history of bipolar disorder or other mental illness Social History:  History  Alcohol Use  . 0.0 oz/week  . 0 Standard drinks or equivalent per week     History  Drug Use  . 2.00 per week    Social History   Social History  . Marital Status: Single    Spouse Name: N/A  . Number of Children: N/A  . Years of Education: N/A   Social History Main Topics  . Smoking status: Former Research scientist (life sciences)  . Smokeless tobacco: None  . Alcohol Use: 0.0 oz/week    0 Standard drinks or equivalent per week  . Drug Use: 2.00 per week  . Sexual Activity: Not Asked   Other Topics Concern  . None   Social History Narrative   Additional Social History:     Allergies:   Allergies  Allergen Reactions  . Penicillins Other (See Comments)    Reaction:  Unknown     Labs:  Results for orders placed or performed during the hospital encounter of 12/03/15 (from the past 48 hour(s))  Glucose, capillary     Status: Abnormal   Collection Time: 12/06/15  9:22 PM  Result Value Ref Range   Glucose-Capillary 173 (H) 65 - 99 mg/dL  Glucose, capillary     Status: Abnormal   Collection Time: 12/07/15  7:43 AM  Result Value Ref Range   Glucose-Capillary 104 (H) 65 - 99 mg/dL  Magnesium     Status: None   Collection Time: 12/07/15 11:54 AM  Result Value Ref Range   Magnesium 2.0 1.7 - 2.4 mg/dL  Glucose, capillary     Status: Abnormal   Collection Time: 12/07/15 12:22 PM  Result Value Ref Range   Glucose-Capillary 157 (H) 65 - 99 mg/dL  Glucose, capillary  Status: Abnormal   Collection Time: 12/07/15  4:39 PM  Result Value Ref Range   Glucose-Capillary 122 (H) 65 - 99 mg/dL  Glucose, capillary     Status: Abnormal   Collection Time: 12/07/15  9:16 PM  Result Value Ref Range   Glucose-Capillary 131 (H) 65 - 99 mg/dL  Glucose, capillary     Status: None   Collection Time: 12/08/15  7:36 AM  Result Value Ref Range   Glucose-Capillary 90 65 - 99 mg/dL  Glucose, capillary     Status: Abnormal   Collection Time: 12/08/15 11:32 AM  Result Value Ref Range   Glucose-Capillary 130 (H) 65 - 99 mg/dL  Glucose, capillary     Status: Abnormal   Collection Time: 12/08/15  4:31 PM  Result Value Ref Range   Glucose-Capillary 110 (H) 65 - 99 mg/dL    Current Facility-Administered Medications  Medication Dose Route Frequency Provider Last Rate Last Dose  . acetaminophen (TYLENOL) tablet 650 mg  650 mg Oral Q6H PRN Lytle Butte, MD   325 mg at 12/08/15 [id number]   Or  . acetaminophen (TYLENOL) suppository 650 mg  650 mg Rectal Q6H PRN Lytle Butte, MD      . antiseptic oral rinse (CPC / CETYLPYRIDINIUM CHLORIDE 0.05%) solution 7 mL  7 mL Mouth Rinse BID  Theodoro Grist, MD   7 mL at 12/08/15 0913  . ARIPiprazole (ABILIFY) tablet 15 mg  15 mg Oral QHS Lytle Butte, MD   15 mg at 12/07/15 2136  . aspirin EC tablet 81 mg  81 mg Oral Daily Lytle Butte, MD   81 mg at 12/04/15 [id number]  . divalproex (DEPAKOTE) DR tablet 500 mg  500 mg Oral QHS Lytle Butte, MD   500 mg at 12/07/15 2136  . enoxaparin (LOVENOX) injection 40 mg  40 mg Subcutaneous Q12H Theodoro Grist, MD   40 mg at 12/07/15 2200  . furosemide (LASIX) tablet 80 mg  80 mg Oral BID Theodoro Grist, MD   80 mg at 12/08/15 1737  . hydrALAZINE (APRESOLINE) injection 10 mg  10 mg Intravenous Q4H PRN Vishal Mungal, MD      . ipratropium-albuterol (DUONEB) 0.5-2.5 (3) MG/3ML nebulizer solution 3 mL  3 mL Nebulization Q4H PRN Lytle Butte, MD      . metFORMIN (GLUCOPHAGE) tablet 500 mg  500 mg Oral BID WC Theodoro Grist, MD   500 mg at 12/08/15 1737  . metoprolol succinate (TOPROL-XL) 24 hr tablet 12.5 mg  12.5 mg Oral Daily Hillary Bow, MD   12.5 mg at 12/08/15 0912  . morphine 2 MG/ML injection 2 mg  2 mg Intravenous Q4H PRN Lytle Butte, MD      . ondansetron [facility]) tablet 4 mg  4 mg Oral Q6H PRN Lytle Butte, MD       Or  . ondansetron [facility]) injection 4 mg  4 mg Intravenous Q6H PRN Lytle Butte, MD      . potassium chloride SA (K-DUR,KLOR-CON) CR tablet 20 mEq  20 mEq Oral BID Lytle Butte, MD   20 mEq at 12/08/15 0913  . sodium chloride flush (NS) 0.9 % injection 3 mL  3 mL Intravenous Q12H Lytle Butte, MD   3 mL at 12/08/15 [id number]    Musculoskeletal: Strength & Muscle Tone: decreased Gait & Station: unsteady Patient leans: N/A  Psychiatric Specialty Exam: Review of Systems  HENT: Negative.   Eyes: Negative.   Respiratory:  Negative.   Cardiovascular: Positive for orthopnea and leg swelling.  Gastrointestinal: Negative.   Musculoskeletal: Negative.   Skin: Negative.   Neurological: Negative for weakness.  Psychiatric/Behavioral: Negative for depression, suicidal ideas,  hallucinations, memory loss and substance abuse. The patient is not nervous/anxious and does not have insomnia.     Blood pressure 100/64, pulse 75, temperature 98.2 F (36.8 C), temperature source Oral, resp. rate 19, height 5\' 4"  (1.626 m), weight 169.01 kg (372 lb 9.6 oz), SpO2 95 %.Body mass index is 63.93 kg/(m^2).  General Appearance: Casual  Eye Contact::  Fair  Speech:  Slow  Volume:  Decreased  Mood:  Euthymic  Affect:  Congruent  Thought Process:  Goal Directed  Orientation:  Full (Time, Place, and Person)  Thought Content:  Negative  Suicidal Thoughts:  No  Homicidal Thoughts:  No  Memory:  Immediate;   Fair Recent;   Fair Remote;   Fair  Judgement:  Impaired  Insight:  Fair  Psychomotor Activity:  Decreased  Concentration:  Fair  Recall:  [employer] of Knowledge:Fair  Language: Fair  Akathisia:  No  Handed:  Right  AIMS (if indicated):     Assets:  Desire for Improvement Financial Resources/Insurance Housing Social Support  ADL's:  Impaired  Cognition: WNL  Sleep:      Treatment Plan Summary: Daily contact with patient to assess and evaluate symptoms and progress in treatment, Medication management and Plan Stable. No worsening of psychiatric condition. Medically he appears to be doing reasonably well not in immediate life threatening danger. Encourage patient to continue being cooperative as she could with staff. No change to medication for now. Follow-up as needed.  Disposition: Patient does not meet criteria for psychiatric inpatient admission. Supportive therapy provided about ongoing stressors.  Alethia Berthold, MD 12/08/2015 6:26 PM

## 2015-12-08 NOTE — Progress Notes (Signed)
Patient pleasant and cooperative with care. Did refuse medications. Educated on the importance of medication compliance. Pt continued to refuse insulin, metoprolol, aspirin and lovenox. Md made aware. Pt eating meals well and up to [facility]. Pt up in chair most of shift. C/o headache early on shift, prn given with good relief. No complaints of shortness of breath. Will continue to monitor and assess.

## 2015-12-08 NOTE — Care Management Important Message (Signed)
Important Message  Patient Details  Name: Jodi [name] MRN: [id number] Date of Birth: 08-16-1961   Medicare Important Message Given:  Yes    Juliann Pulse A Teriah Muela 12/08/2015, 1:35 PM

## 2015-12-08 NOTE — Progress Notes (Signed)
[place] [facility] [place] NAME: Jodi [name]    MR#:  [id number]  DATE OF BIRTH:  Mar 23, 1961  SUBJECTIVE:  CHIEF COMPLAINT:   Chief Complaint  Patient presents with  . Shortness of Breath   Admitted for worsening shortness of breath and 20-25 pound weight gain with worsening lower extremity edema. Initially on BiPAP and presently on nasal cannula. Patient weighs 2 L oxygen at home although noncompliant with it. Has been refusing multiple medications in the hospital. Still has shortness of breath and lower extremity edema.  Refusing insulin to sliding scale. Refused metoprolol and Lovenox.   Review of Systems  Constitutional: Negative for fever, chills and weight loss.  HENT: Negative for congestion.   Eyes: Negative for blurred vision and double vision.  Respiratory: Negative for cough, sputum production, shortness of breath and wheezing.   Cardiovascular: Negative for chest pain, palpitations, orthopnea, leg swelling and PND.  Gastrointestinal: Negative for nausea, vomiting, abdominal pain, diarrhea, constipation and blood in stool.  Genitourinary: Negative for dysuria, urgency, frequency and hematuria.  Musculoskeletal: Negative for falls.  Neurological: Negative for dizziness, tremors, focal weakness and headaches.  Endo/Heme/Allergies: Does not bruise/bleed easily.  Psychiatric/Behavioral: Negative for depression. The patient does not have insomnia.     VITAL SIGNS: Blood pressure 95/50, pulse 75, temperature 98.4 F (36.9 C), temperature source Oral, resp. rate 19, height 5\' 4"  (1.626 m), weight 168.4 kg (371 lb 4.1 oz), SpO2 93 %.  PHYSICAL EXAMINATION:   GENERAL:  55 y.o.-year-old patient sitting in the chair. EYES: Pupils equal, round, reactive to light and accommodation. No scleral icterus. Extraocular muscles intact.  HEENT: Head atraumatic, normocephalic. Oropharynx and nasopharynx clear.  NECK:  Supple, no jugular  venous distention. No thyroid enlargement, no tenderness.  LUNGS: Diminished breath sounds bilaterally at bases, especially posteriorly, no wheezing, but few anterior rhonchi noted. Using accessory muscles of respiration, tachypneic, uncomfortable, on BiPAP.  CARDIOVASCULAR: S1, S2 , tachycardic. No murmurs, rubs, or gallops, distant.  ABDOMEN: Soft, nontender, nondistended. Bowel sounds present. No organomegaly or mass.  EXTREMITIES: 3+ lower extremity and pedal edema, no cyanosis, or clubbing.  NEUROLOGIC: Cranial nerves II through XII are grossly intact.  Muscle strength 5/5 in all extremities. Sensation grossly intact. Gait not checked.  PSYCHIATRIC: The patient is alert, comfortable, communicative. SKIN: No obvious rash, lesion, or ulcer.   ORDERS/RESULTS REVIEWED:   CBC  Recent Labs Lab 12/03/15 1802 12/04/15 0500 12/06/15 0200  WBC 11.3* 9.2 8.4  HGB 12.8 12.3 12.4  HCT 44.8 42.9 43.2  PLT 169 155 156  MCV 80.9 81.9 79.9*  MCH 23.1* 23.4* 22.9*  MCHC 28.6* 28.5* 28.6*  RDW 22.2* 21.7* 22.5*  LYMPHSABS 1.3  --   --   MONOABS 1.2*  --   --   EOSABS 0.1  --   --   BASOSABS 0.1  --   --    ------------------------------------------------------------------------------------------------------------------  Chemistries   Recent Labs Lab 12/03/15 1802 12/04/15 0500 12/05/15 0413 12/06/15 0200 12/07/15 1154  NA 138 139 143 141  --   K 4.2 4.4 4.3 4.3  --   CL 97* 98* 100* 98*  --   CO2 34* 34* 42* 39*  --   GLUCOSE 134* 159* 120* 116*  --   BUN 61* 58* 40* 26*  --   CREATININE 1.33* 1.15* 0.82 0.64  --   CALCIUM 8.6* 8.5* 8.8* 8.5*  --   MG  --   --   --  2.2 2.0  AST 20  --   --   --   --   ALT 38  --   --   --   --   ALKPHOS 59  --   --   --   --   BILITOT 0.6  --   --   --   --    ------------------------------------------------------------------------------------------------------------------ estimated creatinine clearance is 125.7 mL/min (by C-G formula  based on Cr of 0.64). ------------------------------------------------------------------------------------------------------------------ No results for input(s): TSH, T4TOTAL, T3FREE, THYROIDAB in the last 72 hours.  Invalid input(s): FREET3  Cardiac Enzymes  Recent Labs Lab 12/03/15 1802 12/03/15 2147  TROPONINI 0.55* 0.50*   ------------------------------------------------------------------------------------------------------------------ Invalid input(s): POCBNP ---------------------------------------------------------------------------------------------------------------  RADIOLOGY: No results found.  EKG:  Orders placed or performed during the hospital encounter of 12/03/15  . EKG 12-Lead  . EKG 12-Lead  . EKG 12-Lead  . EKG 12-Lead  . EKG 12-Lead  . EKG 12-Lead    ASSESSMENT AND PLAN:  Principal Problem:   Bipolar disorder ([place]) Active Problems:   Non compliance with medical treatment   Acute on chronic diastolic (congestive) heart failure (HCC)   Pulmonary hypertension (HCC)   Acute respiratory failure with hypoxia and hypercarbia (HCC)   Morbid obesity due to excess calories (HCC)   Nocturnal hypoxemia due to obesity   # Acute on chronic respiratory failure with hypoxia and hypercapnia, Off BiPAP Due to congestive heart failure Improving. Off BiPAP. Continue nasal cannula She will need CPAP at home due to morbid obesity. Follow-up with pulmonary after discharge.  # Acute on chronic diastolic CHF - Lasix, Beta blockers - Input and Output - Counseled to limit fluids and Salt - Monitor Bun/Cr and Potassium -Cardiology follow up after discharge - Continue Lasix for 1 more day for further diuresis  # Acute metabolic encephalopathy, due to hypercapnia, resolved  # Elevated troponin, subcutaneous heparin, aspirin, nitroglycerin, appreciate cardiologist input, recommended metoprolol as well as lisinopril, however, patient refused. Getting psychiatrist involved  to figure out reasons for refusal.   # Diabetes mellitus with hemoglobin A1c 7.7, resumed metformin D/C Accu-Cheks and sliding scale insulin as patient does not want this.  # Ascending aortic aneurysm, appreciate  vascular surgery input, follow-up as outpatient was recommended.  # medical noncompliance, due to poor understanding. Patient counseled. Also seen by psychiatry.  Management plans discussed with the patient, family and they are in agreement.   DRUG ALLERGIES:  Allergies  Allergen Reactions  . Penicillins Other (See Comments)    Reaction:  Unknown     CODE STATUS:     Code Status Orders        Start     Ordered   12/03/15 1948  Full code   Continuous     12/03/15 1948    Code Status History    Date Active Date Inactive Code Status Order ID Comments User Context   09/15/2015  4:29 AM 09/16/2015  3:45 PM Full Code [id number]  Reubin Milan, MD Inpatient      TOTAL  TIME TAKING CARE OF THIS PATIENT: 35 minutes.   Discussed patient and sister at bedside in detail regarding plan for treatment and follow up after discharge. All questions answered.  Possible discharge tomorrow. We'll have physical therapy evaluate patient.  Home with home health versus skilled nursing facility.   Hillary Bow R M.D on 12/08/2015 at 12:20 PM  Between 7am to 6pm - Pager - [phone]  After 6pm go to [email or web address] - [place]  [facility]  Office  [phone]  CC: Primary care physician; Tomasita Morrow, MD

## 2015-12-09 LAB — GLUCOSE, CAPILLARY
GLUCOSE-CAPILLARY: 106 mg/dL — AB (ref 65–99)
GLUCOSE-CAPILLARY: 114 mg/dL — AB (ref 65–99)

## 2015-12-09 MED ORDER — METOPROLOL SUCCINATE ER 25 MG PO TB24: 12.5000 mg | ORAL_TABLET | Freq: Every day | ORAL | Status: DC

## 2015-12-09 NOTE — Progress Notes (Signed)
Palliative Care Update  Review of chart and conversation with Dr. Darvin Neighbours reveals pt does not have need at this time for a Palliative Care consult.  Mental health issues will continue be a priority, but at this time, pt is stable and will be discharged today.  Consult for Palliative Care is discontinued.   Colleen Can, MD

## 2015-12-09 NOTE — Evaluation (Signed)
Physical Therapy Evaluation Patient Details Name: Jodi [name] MRN: [id number] DOB: [birth year] Today's Date: 12/09/2015   History of Present Illness  presented to ER secondary to SOB, LE edema; admitted witha acute/chronic CHF. Hospital course significant for transfer to CCU (for BiPAP) due to hypercarbic respiratory failure.  Currently on 2-3L supplemental O2 via [place].  Clinical Impression  Upon evaluation, patient alert and oriented; follows all commands.  Eager for OOB activity.  Able to complete bed mobility with mod indep; sit/stand, basic transfers and gait (210') with bariatric RW, cga/close sup. Notably SOB with exertion, but refuses rest break or limited activity despite encouragement from therapist.  Noted desat to 78-79% on 3L with exertion, BORG 7/10; recovers to >91% within 1-2 min seated rest and pursed lip breathing.  Extensive education completed regarding activity pacing, energy conservation and graded activity levels to maximize oxygenation with functional activities.  RN informed/aware of patient response to exertion. Would benefit from skilled PT to address above deficits and promote optimal return to PLOF; Recommend transition to [facility] upon discharge from acute hospitalization for continued cardiopulmonary endurance training and education.     Follow Up Recommendations Home health PT    Equipment Recommendations       Recommendations for Other Services       Precautions / Restrictions Precautions Precautions: Fall Restrictions Weight Bearing Restrictions: No      Mobility  Bed Mobility Overal bed mobility: Independent                Transfers Overall transfer level: Needs assistance Equipment used: Rolling walker (2 wheeled) Transfers: Sit to/from Stand Sit to Stand: Supervision;Min guard            Ambulation/Gait Ambulation/Gait assistance: Supervision;Min guard Ambulation Distance (Feet): 210 Feet Assistive device: Rolling walker (2  wheeled)       General Gait Details: broad BOS with decreased step height/length, mild forward trunk flexion.  Slower, but steady, cadence.  Decreased insight into need for/value of activity pacing and energy consevation.  BORG 7/10 after distance (patient refused encouragement from therapist to initiate rest period)  Stairs            Wheelchair Mobility    Modified Rankin (Stroke Patients Only)       Balance Overall balance assessment: Needs assistance Sitting-balance support: No upper extremity supported;Feet supported Sitting balance-Leahy Scale: Good     Standing balance support: Bilateral upper extremity supported Standing balance-Leahy Scale: Fair                               Pertinent Vitals/Pain Pain Assessment: No/denies pain    Home Living Family/patient expects to be discharged to:: Private residence Living Arrangements: Other relatives;Children (sister and son) Available Help at Discharge: Family Type of Home: House Home Access: Stairs to enter Entrance Stairs-Rails: None Entrance Stairs-Number of Steps: 1 Home Layout: One level Home Equipment: Walker - 4 wheels      Prior Function Level of Independence: Independent with assistive device(s)         Comments: Mod indep for household mobility and limited community distances; assist for ADLs (from sister) as needed.  Denies fall history.  Does report using power scooter for longer, community distances when needed.     Hand Dominance        Extremity/Trunk Assessment   Upper Extremity Assessment: Overall WFL for tasks assessed           Lower  Extremity Assessment: Overall WFL for tasks assessed         Communication   Communication: No difficulties  Cognition Arousal/Alertness: Awake/alert Behavior During Therapy: WFL for tasks assessed/performed Overall Cognitive Status: Within Functional Limits for tasks assessed                      General Comments       Exercises Other Exercises Other Exercises: Toilet transfer, ambulatory with RW, cga/close sup--difficulty maintaining continence secondary to lasix; dep for hygiene (unable to reach peri-area secondary to body habitus) Other Exercises: Extensive education on activity pacing and energy conservation (value of small bouts of activity) to maximize oxygenation during mobility and ADLs; reviewed home safety modifications and use of assist device as needed to assist.  Patient/sister voiced understanding.      Assessment/Plan    PT Assessment Patient needs continued PT services  PT Diagnosis Generalized weakness   PT Problem List Decreased strength;Decreased activity tolerance;Decreased balance;Decreased mobility;Decreased safety awareness;Decreased knowledge of precautions;Cardiopulmonary status limiting activity;Obesity  PT Treatment Interventions DME instruction;Gait training;Stair training;Functional mobility training;Therapeutic activities;Therapeutic exercise;Balance training;Patient/family education   PT Goals (Current goals can be found in the Care Plan section)      Frequency Min 2X/week   Barriers to discharge        Co-evaluation               End of Session Equipment Utilized During Treatment: Gait belt;Oxygen Activity Tolerance: Patient tolerated treatment well Patient left: in bed;with call bell/phone within reach;with bed alarm set;with family/visitor present Nurse Communication: Mobility status         Time:  -      Charges:         PT G Codes:        Franchon Ketterman H. Owens Shark, PT, DPT, NCS 12/09/2015, 4:52 PM [phone]

## 2015-12-09 NOTE — Progress Notes (Signed)
Pt refused aspirin, lovenox and metoprolol for scheduled AM medications this AM. Education provided, pt states "that's ok, thank you I'm still not taking them". MD aware, pt stated she has discussed this with Dr. Darvin Neighbours this AM. Orders for D/C this AM. Pt will await family to arrive to review d/c instructions.

## 2015-12-09 NOTE — Care Management Note (Addendum)
Case Management Note  Patient Details  Name: Jodi [name] MRN: [id number] Date of Birth: 02-Dec-1960  Subjective/Objective:                   Met with patient and her sister (that lives with patient) to discuss discharge planning. Patient states she has limited mobility and will be home bound. Her PCP is PRIMARY CARE PHYSICIAN: WILLIAMS,EMMA, MD. She has a walker available to use for ambulation. She states she plans to ride home in her sisters car. She has chronic O2 through [place]. She would like to use [facility] for home health.  Action/Plan:  List of home health agencies provided. Referral to [facility]. No further RNCM needs.   Expected Discharge Date:                  Expected Discharge Plan:     In-House Referral:     Discharge planning Services  CM Consult  Post Acute Care Choice:    Choice offered to:  Patient, Sibling  DME Arranged:    DME Agency:     HH Arranged:    [place] Agency:  [place]  Status of Service:  Completed, signed off  Medicare Important Message Given:  Yes Date Medicare IM Given:    Medicare IM give by:    Date Additional Medicare IM Given:    Additional Medicare Important Message give by:     If discussed at [place] of Stay Meetings, dates discussed:    Additional Comments:  Marshell Garfinkel, RN 12/09/2015, 10:37 AM

## 2015-12-09 NOTE — Discharge Instructions (Signed)
°  DIET:  Cardiac diet and Diabetic diet  DISCHARGE CONDITION:  Stable  ACTIVITY:  Activity as tolerated  OXYGEN:  Home Oxygen: Yes.     Oxygen Delivery: 2 liters/min via Patient connected to nasal cannula oxygen  DISCHARGE LOCATION:  home   If you experience worsening of your admission symptoms, develop shortness of breath, life threatening emergency, suicidal or homicidal thoughts you must seek medical attention immediately by calling 911 or calling your MD immediately  if symptoms less severe.  You Must read complete instructions/literature along with all the possible adverse reactions/side effects for all the Medicines you take and that have been prescribed to you. Take any new Medicines after you have completely understood and accpet all the possible adverse reactions/side effects.   Please note  You were cared for by a hospitalist during your hospital stay. If you have any questions about your discharge medications or the care you received while you were in the hospital after you are discharged, you can call the unit and asked to speak with the hospitalist on call if the hospitalist that took care of you is not available. Once you are discharged, your primary care physician will handle any further medical issues. Please note that NO REFILLS for any discharge medications will be authorized once you are discharged, as it is imperative that you return to your primary care physician (or establish a relationship with a primary care physician if you do not have one) for your aftercare needs so that they can reassess your need for medications and monitor your lab values.    - Daily fluids < 2 liters. - Low salt diet - Check weight everyday and keep log. Take to your doctors appt. - Take extra dose of lasix if you gain more than 3 pounds weight.  Call your doctor for a SLEEP STUDY

## 2015-12-10 LAB — BLOOD GAS, ARTERIAL
ALLENS TEST (PASS/FAIL): POSITIVE — AB
ALLENS TEST (PASS/FAIL): POSITIVE — AB
Acid-Base Excess: 12.9 mmol/L — ABNORMAL HIGH (ref 0.0–3.0)
Acid-Base Excess: 14.7 mmol/L — ABNORMAL HIGH (ref 0.0–3.0)
Bicarbonate: 43.3 mEq/L — ABNORMAL HIGH (ref 21.0–28.0)
Bicarbonate: 44.8 mEq/L — ABNORMAL HIGH (ref 21.0–28.0)
DELIVERY SYSTEMS: POSITIVE
Delivery systems: POSITIVE
EXPIRATORY PAP: 6
Expiratory PAP: 10
FIO2: 0.5
FIO2: 0.4
INSPIRATORY PAP: 24
Inspiratory PAP: 18
LHR: 12 {breaths}/min
O2 SAT: 97.2 %
O2 Saturation: 96.8 %
PATIENT TEMPERATURE: 37
PATIENT TEMPERATURE: 37
PCO2 ART: 88 mmHg — AB (ref 32.0–48.0)
PCO2 ART: 85 mmHg — AB (ref 32.0–48.0)
PEEP: 10 cmH2O
PH ART: 7.3 — AB (ref 7.350–7.450)
PO2 ART: 94 mmHg (ref 83.0–108.0)
RATE: 24 resp/min
pH, Arterial: 7.33 — ABNORMAL LOW (ref 7.350–7.450)
pO2, Arterial: 101 mmHg (ref 83.0–108.0)

## 2015-12-10 NOTE — Discharge Summary (Signed)
[facility] NAME: Jodi [name]    MR#:  [id number]  DATE OF BIRTH:  08-02-1961  DATE OF ADMISSION:  12/03/2015 ADMITTING PHYSICIAN: Lytle Butte, MD  DATE OF DISCHARGE: 12/09/2015  4:11 PM  PRIMARY CARE PHYSICIAN: Tomasita Morrow, MD   ADMISSION DIAGNOSIS:  Pulmonary hypertension (HCC) [I27.2]  DISCHARGE DIAGNOSIS:  Principal Problem:   Bipolar disorder ([place]) Active Problems:   Non compliance with medical treatment   Acute on chronic diastolic (congestive) heart failure (HCC)   Pulmonary hypertension (HCC)   Acute respiratory failure with hypoxia and hypercarbia (HCC)   Morbid obesity due to excess calories (HCC)   Nocturnal hypoxemia due to obesity   Bipolar disorder, in partial remission, most recent episode manic ([place])   SECONDARY DIAGNOSIS:   Past Medical History  Diagnosis Date  . Bipolar disorder ([place])   . Chronic back pain   . Chronic knee pain   . Morbid obesity ([place])   . Obesity hypoventilation syndrome ([place])   . Depression   . Diabetes mellitus     Type II  . Moderate to severe pulmonary hypertension ([place])   . Pickwickian syndrome ([place])   . Cor pulmonale (chronic) ([place])   . Hypertension   . Hyperlipemia   . Thoracic aortic aneurysm ([place])   . Atypical chest pain      ADMITTING HISTORY  Jodi [name] is a 55 y.o. female with a known history of Diastolic congestive heart failure presenting with shortness of breath and lower extremity edema. Her symptoms have been progressive since recent discharge from [facility] system impression 1 week ago. She admits to medical and dietary noncompliance stated that she took her Lasix as needed and never actually picked up her medications from prior discharge. She is also noncompliant with oxygen and she is mostly on 2 L nasal cannula Baseline which she states "I usually don't need that." Regardless, she is presenting with worsening shortness of breath dyspnea on  exertion and lower extremity edema or orthopnea denies any chest pain fevers chills or further symptomatology. On arrival to emergency department noted to be saturating 60% room air  She admits to about 20-25 pound weight gain  HOSPITAL COURSE:   # Acute on chronic diastolic CHF Patient was aggressively treated with BiPAP support. Diuresed well with IV Lasix.  Lower extremity edema is much improved. By the day of discharge she is on 2 L oxygen which is her baseline. She has been given instructions regarding CHF including fluid restriction, salt restriction, daily weighing. Follow-up with primary care physician within a week.  Patient has been noncompliant with medications in the past and also during the hospital stay. She is high risk for readmission due to noncompliance with medications. Also discussed her care with sister at bedside.  # Acute metabolic encephalopathy, due to hypercapnia, resolved  # Elevated troponin, subcutaneous heparin, aspirin, nitroglycerin, appreciate cardiologist input, recommended metoprolol as well as lisinopril, however, patient refused. Getting psychiatrist involved to figure out reasons for refusal.   # Diabetes mellitus with hemoglobin A1c 7.7, resumed metformin D/C Accu-Cheks and sliding scale insulin as patient does not want this.  # Ascending aortic aneurysm, appreciate vascular surgery input, follow-up as outpatient was recommended.  # medical noncompliance, due to poor understanding. Patient counseled. Also seen by psychiatry.  Stable for discharge home on diuretics, aspirin, metoprolol. Patient was offered a sleep study with pulmonary here in [place] but wanted to set up a sleep study  through her primary care physician in [place].  CONSULTS OBTAINED:  Treatment Team:  Lytle Butte, MD Katha Cabal, MD Gonzella Lex, MD  DRUG ALLERGIES:   Allergies  Allergen Reactions  . Penicillins Other (See Comments)    Reaction:  Unknown      DISCHARGE MEDICATIONS:   Discharge Medication List as of 12/09/2015 12:11 PM    CONTINUE these medications which have CHANGED   Details  metoprolol succinate (TOPROL XL) 25 MG 24 hr tablet Take 0.5 tablets (12.5 mg total) by mouth daily., Starting 12/09/2015, Until Discontinued, Normal      CONTINUE these medications which have NOT CHANGED   Details  ARIPiprazole (ABILIFY) 15 MG tablet Take 15 mg by mouth at bedtime., Until Discontinued, Historical Med    aspirin EC 81 MG EC tablet Take 1 tablet (81 mg total) by mouth daily., Starting 11/19/2015, Until Discontinued, No Print    divalproex (DEPAKOTE) 500 MG DR tablet Take 500 mg by mouth at bedtime., Until Discontinued, Historical Med    furosemide (LASIX) 40 MG tablet Take 2 tablets (80 mg total) by mouth 2 (two) times daily., Starting 09/16/2015, Until Discontinued, Normal    ibuprofen (ADVIL,MOTRIN) 200 MG tablet Take 400 mg by mouth every 6 (six) hours as needed for headache or mild pain., Until Discontinued, Historical Med    metFORMIN (GLUCOPHAGE) 500 MG tablet Take 500 mg by mouth 2 (two) times daily with a meal., Until Discontinued, Historical Med    potassium chloride SA (K-DUR,KLOR-CON) 20 MEQ tablet Take 1 tablet (20 mEq total) by mouth 2 (two) times daily., Starting 11/19/2015, Until Discontinued, Normal      STOP taking these medications     lisinopril (PRINIVIL,ZESTRIL) 5 MG tablet         Today   VITAL SIGNS:  Blood pressure 111/63, pulse 74, temperature 98 F (36.7 C), temperature source Oral, resp. rate 20, height 5\' 4"  (1.626 m), weight 169.917 kg (374 lb 9.6 oz), SpO2 97 %.  I/O:  No intake or output data in the 24 hours ending 12/10/15 1525  PHYSICAL EXAMINATION:  Physical Exam  GENERAL:  55 y.o.-year-old patient lying in the bed with no acute distress. Morbidly obese LUNGS: Normal breath sounds bilaterally, no wheezing, rales,rhonchi or crepitation. No use of accessory muscles of respiration.   CARDIOVASCULAR: S1, S2 normal. No murmurs, rubs, or gallops.  ABDOMEN: Soft, non-tender, non-distended. Bowel sounds present. No organomegaly or mass.  NEUROLOGIC: Moves all 4 extremities. PSYCHIATRIC: The patient is alert and oriented x 3.  SKIN: No obvious rash, lesion, or ulcer.  Bilateral lower extremity edema 2+  DATA REVIEW:   CBC  Recent Labs Lab 12/06/15 0200  WBC 8.4  HGB 12.4  HCT 43.2  PLT 156    Chemistries   Recent Labs Lab 12/03/15 1802  12/06/15 0200 12/07/15 1154  NA 138  < > 141  --   K 4.2  < > 4.3  --   CL 97*  < > 98*  --   CO2 34*  < > 39*  --   GLUCOSE 134*  < > 116*  --   BUN 61*  < > 26*  --   CREATININE 1.33*  < > 0.64  --   CALCIUM 8.6*  < > 8.5*  --   MG  --   < > 2.2 2.0  AST 20  --   --   --   ALT 38  --   --   --  ALKPHOS 59  --   --   --   BILITOT 0.6  --   --   --   < > = values in this interval not displayed.  Cardiac Enzymes  Recent Labs Lab 12/03/15 2147  TROPONINI 0.50*    Microbiology Results  Results for orders placed or performed during the hospital encounter of 12/03/15  MRSA PCR Screening     Status: None   Collection Time: 12/04/15 11:24 AM  Result Value Ref Range Status   MRSA by PCR NEGATIVE NEGATIVE Final    Comment:        The GeneXpert MRSA Assay (FDA approved for NASAL specimens only), is one component of a comprehensive MRSA colonization surveillance program. It is not intended to diagnose MRSA infection nor to guide or monitor treatment for MRSA infections.     RADIOLOGY:  No results found.  Follow up with PCP in 1 week.  Management plans discussed with the patient, family and they are in agreement.  CODE STATUS:  Code Status History    Date Active Date Inactive Code Status Order ID Comments User Context   12/03/2015  7:48 PM 12/09/2015 10:59 AM Full Code [id number]  Lytle Butte, MD ED   09/15/2015  4:29 AM 09/16/2015  3:45 PM Full Code [id number]  Reubin Milan, MD Inpatient       TOTAL TIME TAKING CARE OF THIS PATIENT ON DAY OF DISCHARGE: more than 30 minutes.   Hillary Bow R M.D on 12/10/2015 at 3:25 PM  Between 7am to 6pm - Pager - [phone]  After 6pm go to [email or web address] - password [email or web address] [facility]  Office  [phone]  CC: Primary care physician; Tomasita Morrow, MD  Note: This dictation was prepared with Dragon dictation along with smaller phrase technology. Any transcriptional errors that result from this process are unintentional.

## 2016-02-15 ENCOUNTER — Ambulatory Visit (INDEPENDENT_AMBULATORY_CARE_PROVIDER_SITE_OTHER): Payer: Medicare Other

## 2016-02-15 ENCOUNTER — Ambulatory Visit (INDEPENDENT_AMBULATORY_CARE_PROVIDER_SITE_OTHER): Payer: Medicare Other | Admitting: Physician Assistant

## 2016-02-15 VITALS — BP 103/71 | HR 71 | Temp 97.9°F | Resp 18 | Ht 64.0 in | Wt 336.0 lb

## 2016-02-15 DIAGNOSIS — M25512 Pain in left shoulder: Secondary | ICD-10-CM | POA: Diagnosis not present

## 2016-02-15 DIAGNOSIS — M791 Myalgia, unspecified site: Secondary | ICD-10-CM

## 2016-02-15 MED ORDER — TRAMADOL HCL 50 MG PO TABS: 50.0000 mg | ORAL_TABLET | Freq: Three times a day (TID) | ORAL | Status: DC | PRN

## 2016-02-15 NOTE — Patient Instructions (Signed)
     IF you received an x-ray today, you will receive an invoice from [facility]. Please contact [facility] at [phone] with questions or concerns regarding your invoice.   IF you received labwork today, you will receive an invoice from [facility]. Please contact [facility] at [phone] with questions or concerns regarding your invoice.   Our billing staff will not be able to assist you with questions regarding bills from these companies.  You will be contacted with the lab results as soon as they are available. The fastest way to get your results is to activate your My Chart account. Instructions are located on the last page of this paperwork. If you have not heard from us regarding the results in 2 weeks, please contact this office.      

## 2016-02-15 NOTE — Progress Notes (Signed)
02/15/2016 6:54 PM   DOB: [birth year] / MRN: [id number]  SUBJECTIVE:  Jodi [name] is a 55 y.o. female presenting for left thumb check.  Reports she slammed it in the door about 1 month ago and reports the nail was loose so her daughter trimmed this back for her. She denies pain, tenderness, purulence and fever today.   She complains of left shoulder pain that is worse with movement.  She denies chest pain, SOB, DOE, and leg swelling with this. She has a history of CHF with a normal ejection fraction however she is noted to have severe pulmonary HTN.  She has seen Dr. Tamsen Snider for this in March of 17. She has a sleep apnea evaluation coming up in June of 17.   She reports that when her cor pulmonale symptoms are uncontrolled she can barely ambulate from the living room to the bathroom, has severe leg swelling, and terrible cough, none of which are present today.   She is allergic to penicillins.   She  has a past medical history of Bipolar disorder ([place]); Chronic back pain; Chronic knee pain; Morbid obesity ([place]); Obesity hypoventilation syndrome ([place]); Depression; Diabetes mellitus; Moderate to severe pulmonary hypertension ([place]); Pickwickian syndrome ([place]); Cor pulmonale (chronic) ([place]); Hypertension; Hyperlipemia; Thoracic aortic aneurysm ([place]); and Atypical chest pain.    She  reports that she has quit smoking. She does not have any smokeless tobacco history on file. She reports that she drinks alcohol. She reports that she uses illicit drugs about twice per week. She  has no sexual activity history on file. The patient  has past surgical history that includes Right oophorectomy.  Her family history includes Diabetes in her other.  Review of Systems  Constitutional: Negative for fever.  Respiratory: Negative for cough, sputum production and wheezing.   Cardiovascular: Negative for chest pain.  Gastrointestinal: Negative for nausea.  Musculoskeletal: Positive for myalgias (left pectoral  and rhomboidius) and joint pain (left should). Negative for back pain, falls and neck pain.  Skin: Negative for rash.  Neurological: Negative for dizziness and headaches.    Problem list and medications reviewed and updated by myself where necessary, and exist elsewhere in the encounter.   OBJECTIVE:  BP 103/71 mmHg  Pulse 71  Temp(Src) 97.9 F (36.6 C) (Oral)  Resp 18  Ht 5\' 4"  (1.626 m)  Wt 336 lb (152.409 kg)  BMI 57.65 kg/m2  SpO2 90%  Physical Exam  Constitutional: She is oriented to person, place, and time. She appears well-nourished. No distress.  Eyes: EOM are normal. Pupils are equal, round, and reactive to light.  Cardiovascular: Normal rate, regular rhythm and normal heart sounds.   Pulmonary/Chest: Effort normal and breath sounds normal.  Abdominal: She exhibits no distension.  Musculoskeletal: Normal range of motion.       Left shoulder: She exhibits pain. She exhibits normal range of motion, no bony tenderness and no deformity.       Arms: Neurological: She is alert and oriented to person, place, and time. No cranial nerve deficit. Gait normal.  Skin: Skin is dry. She is not diaphoretic.  Psychiatric: She has a normal mood and affect.  Vitals reviewed.  Lab Results  Component Value Date   CREATININE 0.64 12/06/2015     No results found for this or any previous visit (from the past 72 hour(s)).  No results found.  ASSESSMENT AND PLAN  Katelynne was seen today for finger injury, arm pain and chest/heart pain.  Diagnoses and  all orders for this visit:  Myalgia: Highly suspect MSK etiology. While her neck radiograph looks overall well, there is some spurring about C5-C6 in my opinion.  This corresponds to her symptoms and would explain why she associates spasm like symptoms in the rhomboid and pectoral area.  There has been no injury however she is severely deconditioned and they may not need to be a frank injury to account for the pain.  She is taking Tylenol.   I have encouraged her to continue this and asked that she avoid NSAIDs due to her history of cor pulmonale. Given her history of this will also avoid combining muscle relaxers with tramadol as this could lead to respiratory depression.  -     DG Cervical Spine Complete; Future -     DG Shoulder Left; Future  Left shoulder pain: See problem 1.    The patient was advised to call or return to clinic if she does not see an improvement in symptoms or to seek the care of the closest emergency department if she worsens with the above plan.   Philis Fendt, MHS, PA-C [facility] 02/15/2016 6:54 PM

## 2016-02-22 ENCOUNTER — Encounter (HOSPITAL_COMMUNITY): Payer: Self-pay

## 2016-02-22 ENCOUNTER — Emergency Department (HOSPITAL_COMMUNITY): Payer: No Typology Code available for payment source

## 2016-02-22 ENCOUNTER — Emergency Department (HOSPITAL_COMMUNITY)
Admission: EM | Admit: 2016-02-22 | Discharge: 2016-02-22 | Disposition: A | Discharge status: Home / Self Care | Payer: No Typology Code available for payment source | Attending: Emergency Medicine | Admitting: Emergency Medicine

## 2016-02-22 DIAGNOSIS — F319 Bipolar disorder, unspecified: Secondary | ICD-10-CM | POA: Diagnosis not present

## 2016-02-22 DIAGNOSIS — S29002A Unspecified injury of muscle and tendon of back wall of thorax, initial encounter: Secondary | ICD-10-CM | POA: Insufficient documentation

## 2016-02-22 DIAGNOSIS — Z880 Allergy status to penicillin: Secondary | ICD-10-CM | POA: Insufficient documentation

## 2016-02-22 DIAGNOSIS — Z9981 Dependence on supplemental oxygen: Secondary | ICD-10-CM | POA: Insufficient documentation

## 2016-02-22 DIAGNOSIS — Y92410 Unspecified street and highway as the place of occurrence of the external cause: Secondary | ICD-10-CM | POA: Insufficient documentation

## 2016-02-22 DIAGNOSIS — S161XXA Strain of muscle, fascia and tendon at neck level, initial encounter: Secondary | ICD-10-CM | POA: Insufficient documentation

## 2016-02-22 DIAGNOSIS — Z79899 Other long term (current) drug therapy: Secondary | ICD-10-CM | POA: Insufficient documentation

## 2016-02-22 DIAGNOSIS — S199XXA Unspecified injury of neck, initial encounter: Secondary | ICD-10-CM | POA: Diagnosis present

## 2016-02-22 DIAGNOSIS — G8929 Other chronic pain: Secondary | ICD-10-CM | POA: Insufficient documentation

## 2016-02-22 DIAGNOSIS — E119 Type 2 diabetes mellitus without complications: Secondary | ICD-10-CM | POA: Diagnosis not present

## 2016-02-22 DIAGNOSIS — I10 Essential (primary) hypertension: Secondary | ICD-10-CM | POA: Diagnosis not present

## 2016-02-22 DIAGNOSIS — S4992XA Unspecified injury of left shoulder and upper arm, initial encounter: Secondary | ICD-10-CM | POA: Diagnosis not present

## 2016-02-22 DIAGNOSIS — Y9389 Activity, other specified: Secondary | ICD-10-CM | POA: Diagnosis not present

## 2016-02-22 DIAGNOSIS — I509 Heart failure, unspecified: Secondary | ICD-10-CM | POA: Diagnosis not present

## 2016-02-22 DIAGNOSIS — Z7982 Long term (current) use of aspirin: Secondary | ICD-10-CM | POA: Insufficient documentation

## 2016-02-22 DIAGNOSIS — Y998 Other external cause status: Secondary | ICD-10-CM | POA: Diagnosis not present

## 2016-02-22 DIAGNOSIS — M25512 Pain in left shoulder: Secondary | ICD-10-CM

## 2016-02-22 DIAGNOSIS — Z87891 Personal history of nicotine dependence: Secondary | ICD-10-CM | POA: Insufficient documentation

## 2016-02-22 DIAGNOSIS — Z7984 Long term (current) use of oral hypoglycemic drugs: Secondary | ICD-10-CM | POA: Diagnosis not present

## 2016-02-22 NOTE — ED Notes (Signed)
PT left  Personal O2 tank on car. Pt reports using  Nasal O2 at home.
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
## 2016-02-22 NOTE — ED Notes (Signed)
Patient states she is on O2 at home but left tank in her car.  Pt. Placed on 2L and SpO2 is 97%
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
## 2016-02-22 NOTE — ED Notes (Signed)
Declined W/C at D/C and was escorted to lobby by RN. 

## 2016-02-25 ENCOUNTER — Institutional Professional Consult (permissible substitution): Payer: Medicare Other | Admitting: Pulmonary Disease

## 2016-03-27 ENCOUNTER — Institutional Professional Consult (permissible substitution): Payer: Medicare Other | Admitting: Pulmonary Disease

## 2016-08-04 IMAGING — US US EXTREM LOW VENOUS BILAT
1 series · 13 of 24 positions shown · non-contrast
Comparison: None.

CLINICAL DATA: Swelling for 3 weeks.



[Series 1: us extrem low venous bilat · 0.11mm/px · 13 of 60 slices shown]
[im 1/60]
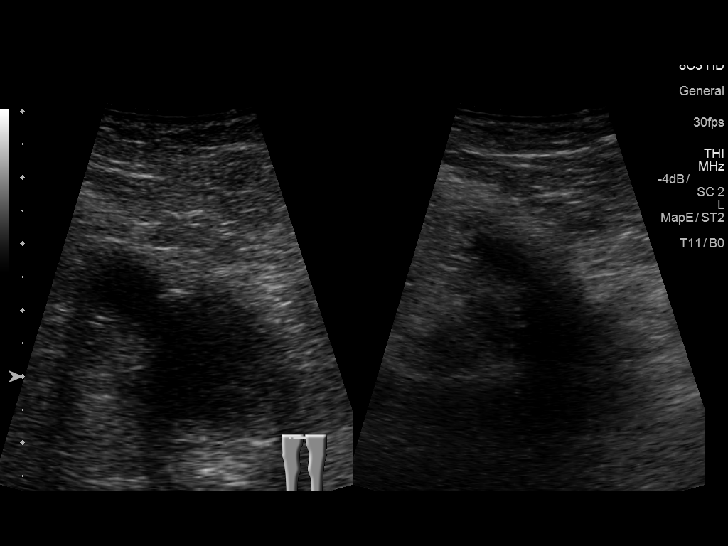
[im 6/60]
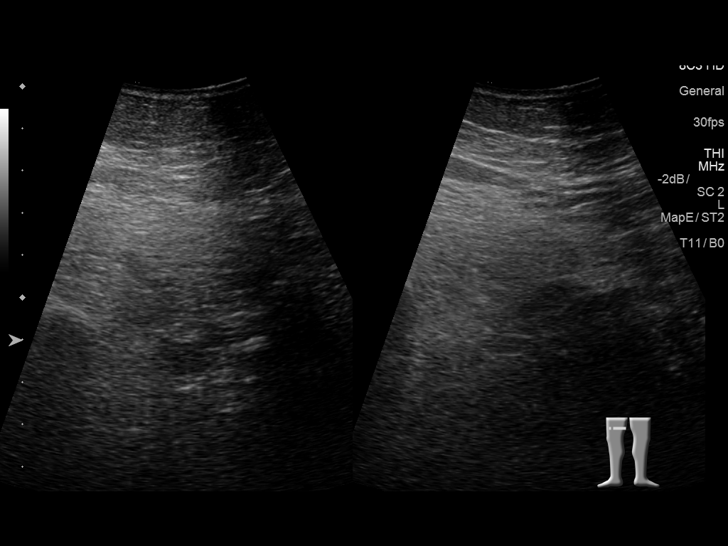
[im 11/60]
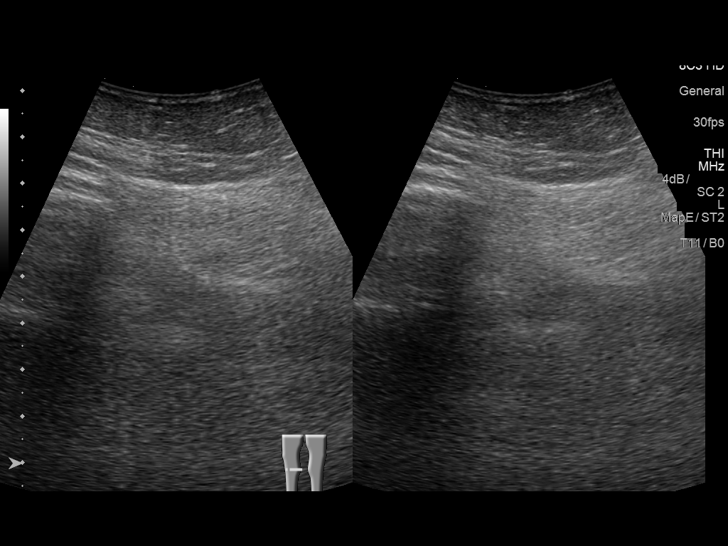
[im 16/60]
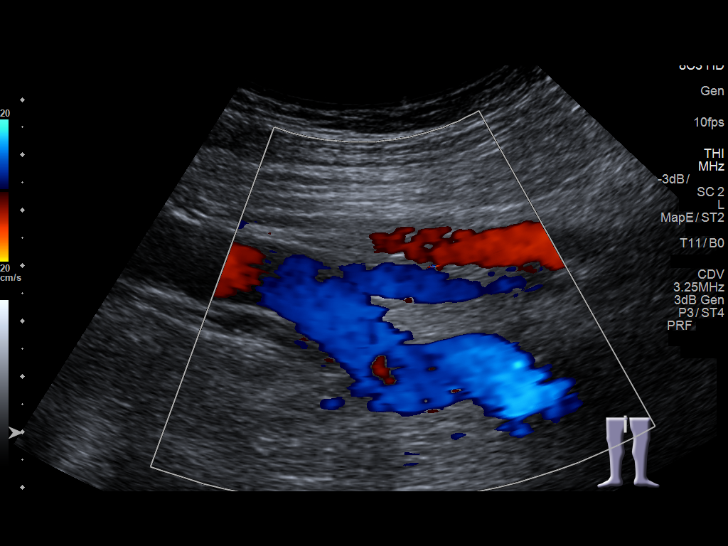
[im 21/60]
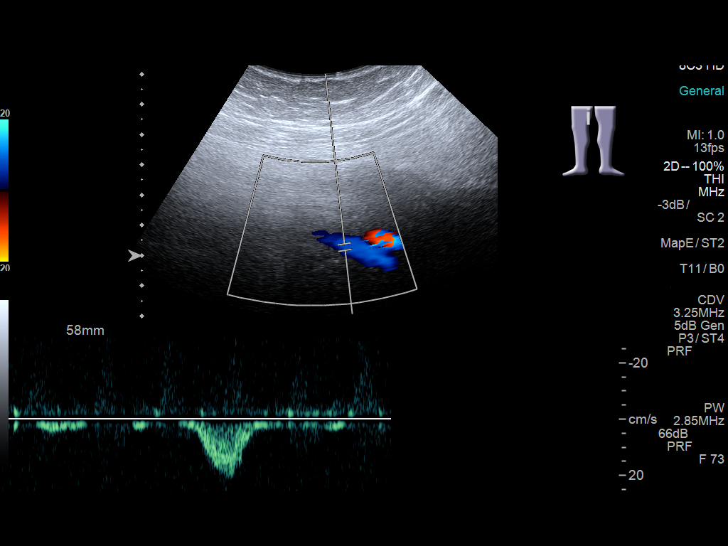
[im 26/60]
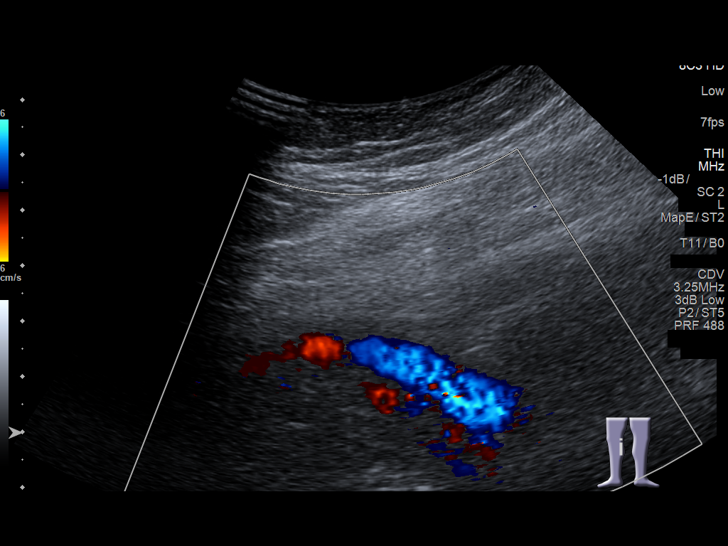
[im 31/60]
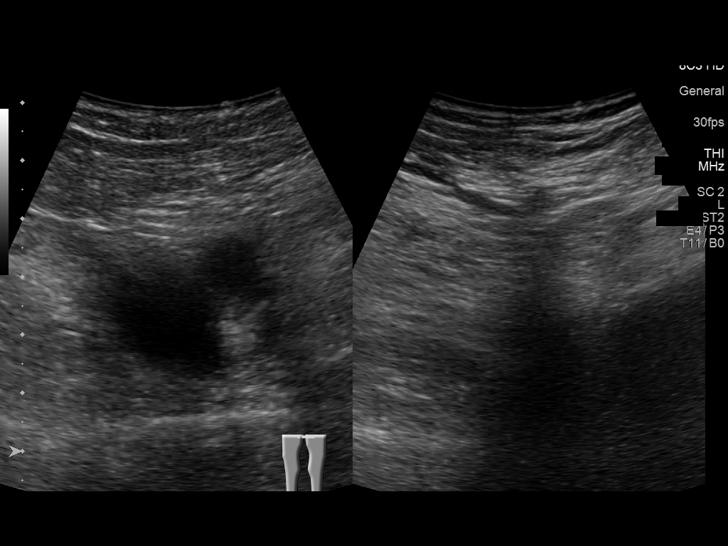
[im 34/60]
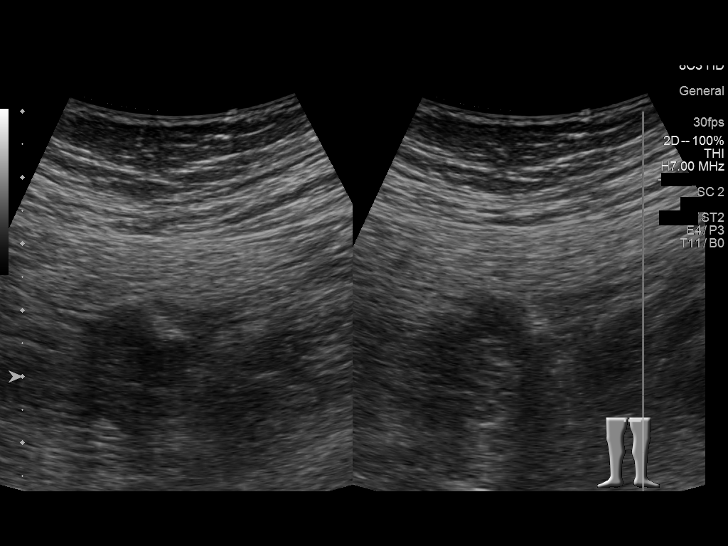
[im 39/60]
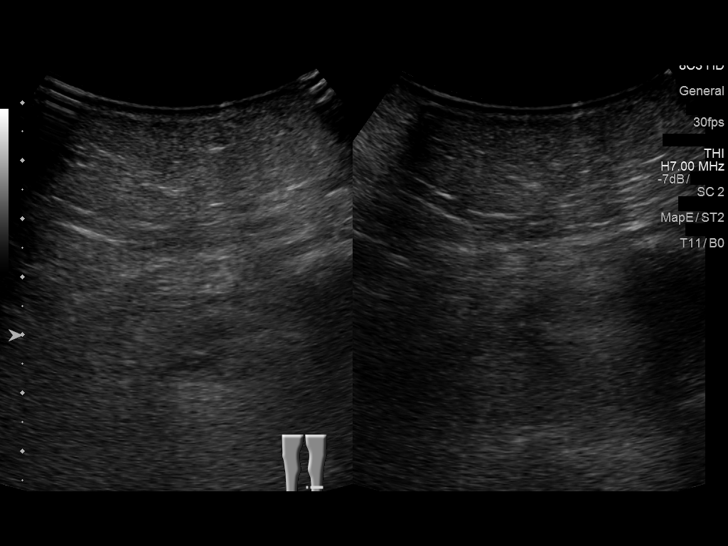
[im 44/60]
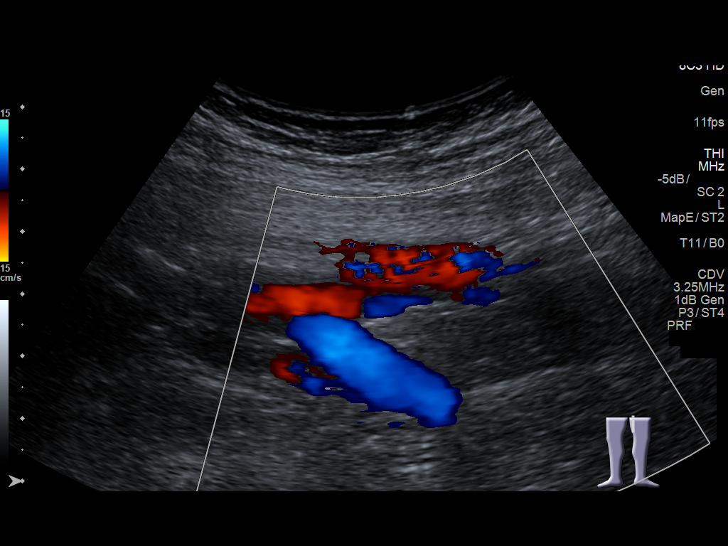
[im 49/60]
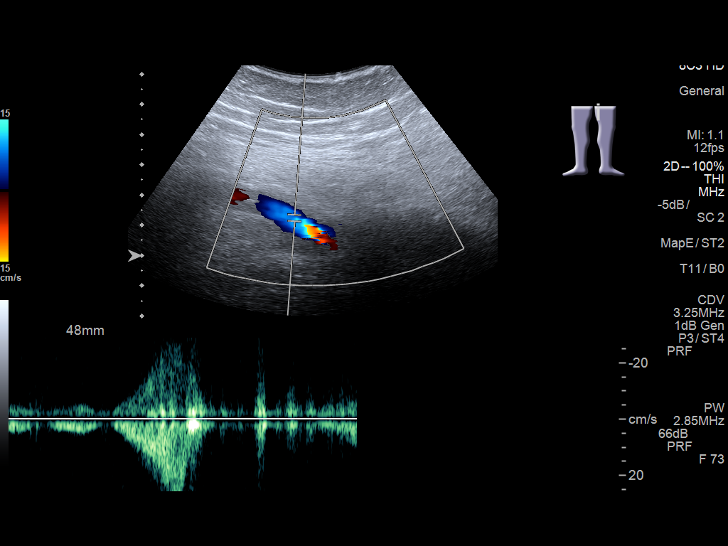
[im 54/60]
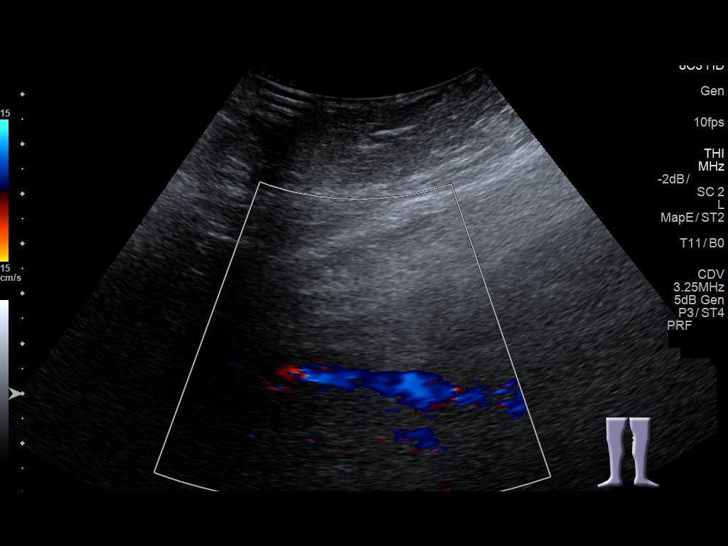
[im 60/60]
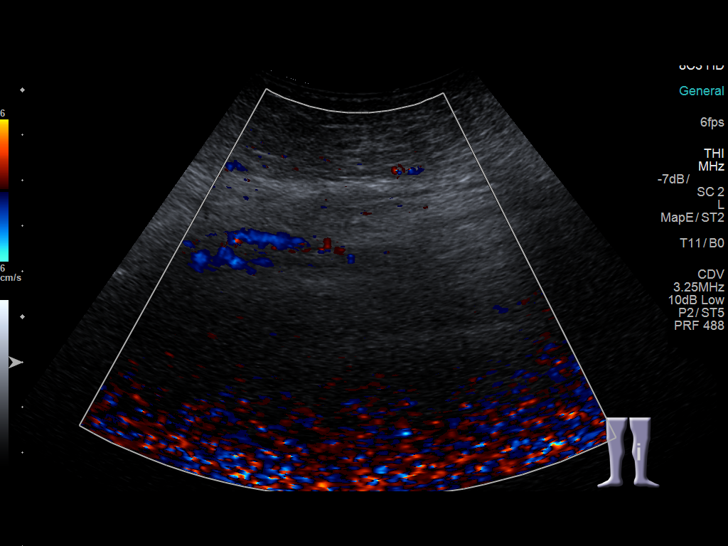

[13 of 24 positions shown; findings below may reference images not displayed]

FINDINGS: RIGHT LOWER EXTREMITY

Normal compressibility, augmentation and color Doppler flow in the
right common femoral vein, right femoral vein and right popliteal
vein. Right profunda femoral vein is patent without thrombus.
Limited evaluation of the calf veins due to body habitus and edema.
The right saphenofemoral junction is patent.

LEFT LOWER EXTREMITY

Normal compressibility, augmentation and color Doppler flow in the
left common femoral vein, left femoral vein and left popliteal vein.
The left saphenofemoral junction is patent. Left profunda femoral
vein is patent without thrombus. Limited evaluation of the left calf
veins.
IMPRESSION: No evidence of deep venous thrombosis. Limited evaluation of the
calf veins as described.

## 2017-01-05 ENCOUNTER — Other Ambulatory Visit: Payer: Self-pay | Admitting: Otolaryngology

## 2017-01-05 DIAGNOSIS — J329 Chronic sinusitis, unspecified: Secondary | ICD-10-CM

## 2017-01-09 ENCOUNTER — Ambulatory Visit
Admission: RE | Admit: 2017-01-09 | Discharge: 2017-01-09 | Disposition: A | Discharge status: Home / Self Care | Payer: Medicare Other | Source: Ambulatory Visit | Attending: Otolaryngology | Admitting: Otolaryngology

## 2017-01-09 DIAGNOSIS — J329 Chronic sinusitis, unspecified: Secondary | ICD-10-CM

## 2017-01-16 ENCOUNTER — Other Ambulatory Visit: Payer: Self-pay | Admitting: Otolaryngology

## 2017-02-07 ENCOUNTER — Encounter (HOSPITAL_COMMUNITY): Payer: Self-pay

## 2017-02-07 ENCOUNTER — Ambulatory Visit: Payer: Self-pay | Admitting: Otolaryngology

## 2017-02-07 ENCOUNTER — Encounter (HOSPITAL_COMMUNITY)
Admission: RE | Admit: 2017-02-07 | Discharge: 2017-02-07 | Disposition: A | Discharge status: Home / Self Care | Payer: Medicare Other | Source: Ambulatory Visit | Attending: Otolaryngology | Admitting: Otolaryngology

## 2017-02-07 DIAGNOSIS — Z01812 Encounter for preprocedural laboratory examination: Secondary | ICD-10-CM | POA: Insufficient documentation

## 2017-02-07 DIAGNOSIS — Z01810 Encounter for preprocedural cardiovascular examination: Secondary | ICD-10-CM | POA: Diagnosis present

## 2017-02-07 HISTORY — DX: Headache: R51

## 2017-02-07 HISTORY — DX: Dependence on supplemental oxygen: Z99.81

## 2017-02-07 HISTORY — DX: Dyspnea, unspecified: R06.00

## 2017-02-07 HISTORY — DX: Headache, unspecified: R51.9

## 2017-02-07 HISTORY — DX: Angina pectoris, unspecified: I20.9

## 2017-02-07 LAB — CBC
HCT: 46.7 % — ABNORMAL HIGH (ref 36.0–46.0)
Hemoglobin: 14.8 g/dL (ref 12.0–15.0)
MCH: 30.8 pg (ref 26.0–34.0)
MCHC: 31.7 g/dL (ref 30.0–36.0)
MCV: 97.3 fL (ref 78.0–100.0)
PLATELETS: 179 10*3/uL (ref 150–400)
RBC: 4.8 MIL/uL (ref 3.87–5.11)
RDW: 13.1 % (ref 11.5–15.5)
WBC: 8.3 10*3/uL (ref 4.0–10.5)

## 2017-02-07 LAB — BASIC METABOLIC PANEL
ANION GAP: 7 (ref 5–15)
BUN: 17 mg/dL (ref 6–20)
CALCIUM: 9.5 mg/dL (ref 8.9–10.3)
CO2: 31 mmol/L (ref 22–32)
Chloride: 102 mmol/L (ref 101–111)
Creatinine, Ser: 0.73 mg/dL (ref 0.44–1.00)
GFR calc Af Amer: >60 mL/min (ref >60)
Glucose, Bld: 111 mg/dL — ABNORMAL HIGH (ref 65–99)
POTASSIUM: 3.5 mmol/L (ref 3.5–5.1)
SODIUM: 140 mmol/L (ref 135–145)

## 2017-02-07 NOTE — Progress Notes (Signed)
   02/07/17 1236  OBSTRUCTIVE SLEEP APNEA  Have you ever been diagnosed with sleep apnea through a sleep study? No  Do you snore loudly (loud enough to be heard through closed doors)?  1  Do you often feel tired, fatigued, or sleepy during the daytime (such as falling asleep during driving or talking to someone)? 1  Has anyone observed you stop breathing during your sleep? 0  Do you have, or are you being treated for high blood pressure? 0  BMI more than 35 kg/m2? 1  Age > 50 (1-yes) 1  Neck circumference greater than:Female 16 inches or larger, Female 17inches or larger? 1  Female Gender (Yes=1) 0  Obstructive Sleep Apnea Score 5  Score 5 or greater  Results sent to PCP

## 2017-02-07 NOTE — Pre-Procedure Instructions (Signed)
Jodi [name]  02/07/2017      [id number], [place] [address] Phone: [phone] Fax: [phone]  [facility] [address] [place], [place] [address] [place] [address] Phone: [phone] Fax: [phone]    Your procedure is scheduled on   02/13/17  Tuesday    Report to [facility] Admitting at 800 A.M.  Call this number if you have problems the morning of surgery:  [phone]   Remember:  Do not eat food or drink liquids after midnight.  Take these medicines the morning of surgery with A SIP OF WATER  TYLENOL, EYE DROPS IF NEEDED     7 days prior to surgery STOP taking any Aspirin, Aleve, Naproxen, Ibuprofen, Motrin, Advil, Goody's, BC's, all herbal medications, fish oil, and all vitamins    How to Manage Your Diabetes Before and After Surgery  Why is it important to control my blood sugar before and after surgery? . Improving blood sugar levels before and after surgery helps healing and can limit problems. . A way of improving blood sugar control is eating a healthy diet by: o  Eating less sugar and carbohydrates o  Increasing activity/exercise o  Talking with your doctor about reaching your blood sugar goals . High blood sugars (greater than 180 mg/dL) can raise your risk of infections and slow your recovery, so you will need to focus on controlling your diabetes during the weeks before surgery. . Make sure that the doctor who takes care of your diabetes knows about your planned surgery including the date and location.  How do I manage my blood sugar before surgery? . Check your blood sugar at least 4 times a day, starting 2 days before surgery, to make sure that the level is not too high or low. o Check your blood sugar the morning of your surgery when you wake up and every 2 hours until you get to the Short Stay  unit. . If your blood sugar is less than 70 mg/dL, you will need to treat for low blood sugar: o Do not take insulin. o Treat a low blood sugar (less than 70 mg/dL) with  cup of clear juice (cranberry or apple), 4 glucose tablets, OR glucose gel. o Recheck blood sugar in 15 minutes after treatment (to make sure it is greater than 70 mg/dL). If your blood sugar is not greater than 70 mg/dL on recheck, call [phone] for further instructions. . Report your blood sugar to the short stay nurse when you get to Short Stay.  . If you are admitted to the hospital after surgery: o Your blood sugar will be checked by the staff and you will probably be given insulin after surgery (instead of oral diabetes medicines) to make sure you have good blood sugar levels. o The goal for blood sugar control after surgery is 80-180 mg/dL.              WHAT DO I DO ABOUT MY DIABETES MEDICATION?   [name] Do not take oral diabetes medicines (pills) the morning of surgery.  .           Reviewed and Endorsed by [facility] Patient Education Committee, August 2015  Do not wear jewelry, make-up or nail polish.  Do not wear lotions, powders, or perfumes, or deoderant.  Do not shave 48 hours prior to surgery.  Men may shave face  and neck.  Do not bring valuables to the hospital.  [facility] is not responsible for any belongings or valuables.  Contacts, dentures or bridgework may not be worn into surgery.  Leave your suitcase in the car.  After surgery it may be brought to your room.  For patients admitted to the hospital, discharge time will be determined by your treatment team.  Patients discharged the day of surgery will not be allowed to drive home.   Name and phone number of your driver:    Special instructions:  [place] - Preparing for Surgery  Before surgery, you can play an important role.  Because skin is not sterile, your skin needs to be as free of germs as possible.  You can reduce  the number of germs on you skin by washing with CHG (chlorahexidine gluconate) soap before surgery.  CHG is an antiseptic cleaner which kills germs and bonds with the skin to continue killing germs even after washing.  Please DO NOT use if you have an allergy to CHG or antibacterial soaps.  If your skin becomes reddened/irritated stop using the CHG and inform your nurse when you arrive at Short Stay.  Do not shave (including legs and underarms) for at least 48 hours prior to the first CHG shower.  You may shave your face.  Please follow these instructions carefully:   1.  Shower with CHG Soap the night before surgery and the                                morning of Surgery.  2.  If you choose to wash your hair, wash your hair first as usual with your       normal shampoo.  3.  After you shampoo, rinse your hair and body thoroughly to remove the                      Shampoo.  4.  Use CHG as you would any other liquid soap.  You can apply chg directly       to the skin and wash gently with scrungie or a clean washcloth.  5.  Apply the CHG Soap to your body ONLY FROM THE NECK DOWN.        Do not use on open wounds or open sores.  Avoid contact with your eyes,       ears, mouth and genitals (private parts).  Wash genitals (private parts)       with your normal soap.  6.  Wash thoroughly, paying special attention to the area where your surgery        will be performed.  7.  Thoroughly rinse your body with warm water from the neck down.  8.  DO NOT shower/wash with your normal soap after using and rinsing off       the CHG Soap.  9.  Pat yourself dry with a clean towel.            10.  Wear clean pajamas.            11.  Place clean sheets on your bed the night of your first shower and do not        sleep with pets.  Day of Surgery  Do not apply any lotions/deoderants the morning of surgery.  Please wear clean clothes to the hospital/surgery center.    Please read over  the following fact sheets  that you were given. Pain Booklet and Surgical Site Infection Prevention

## 2017-02-07 NOTE — H&P (Signed)
PREOPERATIVE H&P  Chief Complaint: right sided nasal obstruction for 6 months  HPI: Jodi [name] is a 56 y.o. female who presents for evaluation of right sided nasal obstruction for 6 months. CT scan demonstrated a large expansile mass completely obstructing the right maxillary sinus with bony changes as noted on scan. 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 Patient is taken to the OR for right Caldwell luc procedure and right FESS. She's been cleared for surgery by her cardiologist as she's had history of CHF and uses home O2 at times.  Past Medical History:  Diagnosis Date  . Anginal pain ([place])   . Atypical chest pain   . Bipolar disorder ([place])   . CHF (congestive heart failure) ([place])   . Chronic back pain   . Chronic knee pain   . Cor pulmonale (chronic) ([place])   . Depression   . Diabetes mellitus    Type II  . Dyspnea    with fluid retention; with activity  . Headache   . Hyperlipemia   . Moderate to severe pulmonary hypertension ([place])   . Morbid obesity ([place])   . Obesity hypoventilation syndrome ([place])   . On home oxygen therapy    PRN  . Pickwickian syndrome ([place])   . Thoracic aortic aneurysm [facility])    Past Surgical History:  Procedure Laterality Date  . RIGHT OOPHORECTOMY    . TONSILLECTOMY     Social History   Social History  . Marital status: Single    Spouse name: N/A  . Number of children: N/A  . Years of education: N/A   Social History Main Topics  . Smoking status: Former Research scientist (life sciences)  . Smokeless tobacco: Never Used     Comment: greater than 14 years ago  . Alcohol use 0.0 oz/week     Comment: rarely  . Drug use: Yes    Frequency: 2.0 times per week  . Sexual activity: Not on file   Other Topics Concern  . Not on file   Social History Narrative  . No narrative on file   Family History  Problem Relation Age of Onset  . Diabetes Other    Allergies  Allergen Reactions  . Penicillins Other (See Comments)   Has patient had a PCN reaction causing immediate rash, facial/tongue/throat swelling, SOB or lightheadedness with hypotension: Unknown Has patient had a PCN reaction causing severe rash involving mucus membranes or skin necrosis: Unknown Has patient had a PCN reaction that required hospitalization: No Has patient had a PCN reaction occurring within the last 10 years: Unknown Patient states her MD informed her that she was allergic. If all of the above answers are "NO", then may proceed with [place]   Prior to Admission medications   Medication Sig Start Date End Date Taking? Authorizing Provider  acetaminophen (TYLENOL) 325 MG tablet Take 650 mg by mouth every 6 (six) hours as needed (for pain.).   Yes [provider]  ARIPiprazole (ABILIFY) 15 MG tablet Take 15 mg by mouth at bedtime.   Yes [provider]  divalproex (DEPAKOTE) 500 MG DR tablet Take 500 mg by mouth at bedtime.   Yes [provider]  furosemide (LASIX) 40 MG tablet Take 2 tablets (80 mg total) by mouth 2 (two) times daily. 09/16/15  Yes Rama, Venetia Maxon, MD  metFORMIN (GLUCOPHAGE) 500 MG tablet Take 500 mg by mouth 2 (two) times daily with a meal.   Yes [provider]  potassium  chloride SA (K-DUR,KLOR-CON) 20 MEQ tablet Take 1 tablet (20 mEq total) by mouth 2 (two) times daily. 11/19/15  Yes Adrian Prows, MD  Tetrahydrozoline HCl (GOODSENSE EYE DROPS OP) Place 1-2 drops into both eyes 3 (three) times daily as needed (for dry/irritated eyes). Tetrahydrozoline HCL 0.05%   Yes [provider]     Positive ROS: nasal obstruction. No pain or pressure.  All other systems have been reviewed and were otherwise negative with the exception of those mentioned in the HPI and as above.  Physical Exam: There were no vitals filed for this visit.  General: Alert, no acute distress. Morbidly obese. Oral: Normal oral mucosa  Nasal: septum severely deviated to the left with right intranasal mass  with total obstruction of the right nasal airway Neck: No palpable adenopathy or thyroid nodules Ear: Ear canal is clear with normal appearing TMs Cardiovascular: Regular rate and rhythm, no murmur.  Respiratory: Clear to auscultation Neurologic: Alert and oriented x 3   Assessment/Plan: neoplasm of maxillary sinus right Plan for Procedure(s): ENDOSCOPIC SINUS ENDO WITH FUSION Right CALDWELL LUC procedure  Melony Overly, MD 02/07/2017 12:19 PM

## 2017-02-07 NOTE — Progress Notes (Signed)
Cardiac records requested from [facility].

## 2017-02-07 NOTE — Progress Notes (Signed)
   02/07/17 1210  OBSTRUCTIVE SLEEP APNEA  Have you ever been diagnosed with sleep apnea through a sleep study? No  Do you snore loudly (loud enough to be heard through closed doors)?  1  Do you often feel tired, fatigued, or sleepy during the daytime (such as falling asleep during driving or talking to someone)? 1  Has anyone observed you stop breathing during your sleep? 0  Do you have, or are you being treated for high blood pressure? 0  BMI more than 35 kg/m2? 1  Age > 50 (1-yes) 1  Neck circumference greater than:Female 16 inches or larger, Female 17inches or larger? 1  Female Gender (Yes=1) 0  Obstructive Sleep Apnea Score 5  Score 5 or greater  Results sent to PCP

## 2017-02-08 LAB — HEMOGLOBIN A1C
HEMOGLOBIN A1C: 6.9 % — AB (ref 4.8–5.6)
MEAN PLASMA GLUCOSE: 151 mg/dL

## 2017-02-08 NOTE — Progress Notes (Addendum)
Anesthesia Chart Review:  Pt is a 56 year old female scheduled for endoscopic sinus endo with fusion on 02/13/2017 with Melony Overly, MD  - PCP is Andrew Au, MD - Cardiologist is Vear Clock, MD, who cleared pt for surgery at last office visit 01/23/17. Note indicates pt clinically stable from diastolic CHF, has lost ~22 lbs in last year.    PMH includes:  CHF, moderate to severe pulmonary HTN, thoracic aortic aneurysm, DM, hyperlipidemia, obesity hypoventilation syndrome, bipolar disorder. Uses home oxygen prn. Former smoker. BMI 59.  - Hospitalized 2/28-11/19/15 and 3/17-23/17 for acute on chronic diastolic HF, mod-severe pulmonary HTN, medical noncompliance. Pt saw pulmonology during 2nd hospital stay.  Vilinda Boehringer, MD note 12/05/15 indicates pt does not have pulmonary arterial HTN and doesn't require vasodilators, but instead likely has OSA, obesity hypoventilation syndrome, and acute on chronic cor pulmonale.   - Pt saw PCP after discharge, sleep study ordered but it appears this did not happen.  Pt still uses O2 prn.  STOP-BANG sleep apnea assessment at PAT positive, forwarded to PCP.    Medications include: Abilify, Depakote, Lasix, metformin, potassium  Preoperative labs reviewed.  HBA1c 6.41m, glucose 111  EKG 02/07/17: Sinus rhythm with sinus arrhythmia with occasional PVCs. Non-specific ST-t changes.   Echo 01/25/17:  1. Poor echo window, wall motion with reduced sensitivity. LV cavity normal in size. Normal global wall motion. Visual EF 55-60%. Unable to determine diastolic function due to lack of E/e'. D-shaped septum in diastole suggests RV volume overload. Calculated EF 58% 2. LA cavity is mildly dilated by dimension 3. RA cavity is mildly dilated 4. RV  cavity is mild to moderately dilated. Poor visualization of RV and function probably preserved 5. Mild cath patient of the aortic valve annulus. Mild aortic valve stenosis. Aortic valve peak gradient of 17 mmHg, mean gradient  10 mmHg, calculated aortic valve area by planimetry 1.33 cm. 6. Trace tricuspid regurgitation. Unable to estimate PA pressure due to absent/minimal TR jet  Echo 11/18/15:  - Left ventricle: Poor echo window due to patient&'s bodily habitus. The cavity size was normal. Wall thickness was increased in a pattern of mild LVH. Systolic function was normal. The estimated ejection fraction was in the range of 55% to 60%. The study is not technically sufficient to allow evaluation of LV diastolic function. - Ventricular septum: The contour showed diastolic flattening and systolic flattening. - Mitral valve: Calcified annulus. Mildly thickened leaflets . - Right ventricle: The cavity size was moderately to severely dilated. Systolic function was moderately reduced. - Right atrium: The atrium was severely dilated. - Pulmonary arteries: PA peak pressure: 65 mm Hg (S). - Compared to the study done on 09/15/2015, although right ventricular dimension normal, personal evaluation reveals the right ventricle probably enlarged. Not well visualized. The present study is much better visualization. There was no significant tricuspid regurgitation previously to measure the PA pressure. However grossly the systolic and diastolic septal flattening is clearly new. - Impressions:The right ventricular systolic pressure was increased consistent with severe pulmonary hypertension.  Reviewed case with Dr. Kalman Shan. Pt will likely need close monitoring post-op given likely undiagnosed OSA, history of obesity hypoventilation syndrome. If no changes, I anticipate pt can proceed with surgery as scheduled.   Willeen Cass, FNP-BC [facility]/Anesthesiology Phone: [phone] 02/09/2017 10:00 AM

## 2017-02-13 ENCOUNTER — Ambulatory Visit (HOSPITAL_COMMUNITY)
Admission: RE | Admit: 2017-02-13 | Discharge: 2017-02-13 | Disposition: A | Discharge status: Home / Self Care | Payer: Medicare Other | Source: Ambulatory Visit | Attending: Otolaryngology | Admitting: Otolaryngology

## 2017-02-13 ENCOUNTER — Encounter (HOSPITAL_COMMUNITY)
Admission: RE | Disposition: A | Discharge status: Home / Self Care | Payer: Self-pay | Source: Ambulatory Visit | Attending: Otolaryngology

## 2017-02-13 ENCOUNTER — Ambulatory Visit (HOSPITAL_COMMUNITY): Payer: Medicare Other | Admitting: Certified Registered Nurse Anesthetist

## 2017-02-13 ENCOUNTER — Ambulatory Visit (HOSPITAL_COMMUNITY): Payer: Medicare Other | Admitting: Emergency Medicine

## 2017-02-13 ENCOUNTER — Encounter (HOSPITAL_COMMUNITY): Payer: Self-pay | Admitting: Otolaryngology

## 2017-02-13 DIAGNOSIS — Z9981 Dependence on supplemental oxygen: Secondary | ICD-10-CM | POA: Diagnosis not present

## 2017-02-13 DIAGNOSIS — C310 Malignant neoplasm of maxillary sinus: Secondary | ICD-10-CM | POA: Insufficient documentation

## 2017-02-13 DIAGNOSIS — I2781 Cor pulmonale (chronic): Secondary | ICD-10-CM | POA: Diagnosis not present

## 2017-02-13 DIAGNOSIS — I209 Angina pectoris, unspecified: Secondary | ICD-10-CM | POA: Diagnosis not present

## 2017-02-13 DIAGNOSIS — Z880 Allergy status to penicillin: Secondary | ICD-10-CM | POA: Insufficient documentation

## 2017-02-13 DIAGNOSIS — Z87891 Personal history of nicotine dependence: Secondary | ICD-10-CM | POA: Diagnosis not present

## 2017-02-13 DIAGNOSIS — Z79899 Other long term (current) drug therapy: Secondary | ICD-10-CM | POA: Insufficient documentation

## 2017-02-13 DIAGNOSIS — F319 Bipolar disorder, unspecified: Secondary | ICD-10-CM | POA: Insufficient documentation

## 2017-02-13 DIAGNOSIS — Z833 Family history of diabetes mellitus: Secondary | ICD-10-CM | POA: Insufficient documentation

## 2017-02-13 DIAGNOSIS — G8929 Other chronic pain: Secondary | ICD-10-CM | POA: Diagnosis not present

## 2017-02-13 DIAGNOSIS — I272 Pulmonary hypertension, unspecified: Secondary | ICD-10-CM | POA: Insufficient documentation

## 2017-02-13 DIAGNOSIS — I712 Thoracic aortic aneurysm, without rupture: Secondary | ICD-10-CM | POA: Diagnosis not present

## 2017-02-13 DIAGNOSIS — I739 Peripheral vascular disease, unspecified: Secondary | ICD-10-CM | POA: Insufficient documentation

## 2017-02-13 DIAGNOSIS — I509 Heart failure, unspecified: Secondary | ICD-10-CM | POA: Diagnosis not present

## 2017-02-13 DIAGNOSIS — Z7984 Long term (current) use of oral hypoglycemic drugs: Secondary | ICD-10-CM | POA: Diagnosis not present

## 2017-02-13 DIAGNOSIS — E785 Hyperlipidemia, unspecified: Secondary | ICD-10-CM | POA: Insufficient documentation

## 2017-02-13 DIAGNOSIS — Z90721 Acquired absence of ovaries, unilateral: Secondary | ICD-10-CM | POA: Diagnosis not present

## 2017-02-13 DIAGNOSIS — Z9889 Other specified postprocedural states: Secondary | ICD-10-CM | POA: Diagnosis not present

## 2017-02-13 DIAGNOSIS — E119 Type 2 diabetes mellitus without complications: Secondary | ICD-10-CM | POA: Diagnosis not present

## 2017-02-13 DIAGNOSIS — J3489 Other specified disorders of nose and nasal sinuses: Secondary | ICD-10-CM | POA: Diagnosis present

## 2017-02-13 HISTORY — DX: Malignant neoplasm of maxillary sinus: C31.0

## 2017-02-13 HISTORY — PX: SINUSOTOMY: SHX291

## 2017-02-13 HISTORY — PX: SINUS ENDO WITH FUSION: SHX5329

## 2017-02-13 LAB — GLUCOSE, CAPILLARY
GLUCOSE-CAPILLARY: 226 mg/dL — AB (ref 65–99)
Glucose-Capillary: 155 mg/dL — ABNORMAL HIGH (ref 65–99)

## 2017-02-13 SURGERY — SURGERY, PARANASAL SINUS, ENDOSCOPIC, WITH NASAL SEPTOPLASTY, TURBINOPLASTY, AND MAXILLARY SINUSOTOMY
Anesthesia: General | Site: Nose | Laterality: Right

## 2017-02-13 MED ORDER — DEXTROSE 5 % IV SOLN
INTRAVENOUS | Status: DC | PRN
  Administered 2017-02-13: 25 ug/min via INTRAVENOUS

## 2017-02-13 MED ORDER — FUROSEMIDE 80 MG PO TABS
80.0000 mg | ORAL_TABLET | Freq: Two times a day (BID) | ORAL | Status: DC
  Filled 2017-02-13: qty 1

## 2017-02-13 MED ORDER — MIDAZOLAM HCL 2 MG/2ML IJ SOLN
INTRAMUSCULAR | Status: DC | PRN
  Administered 2017-02-13: 2 mg via INTRAVENOUS

## 2017-02-13 MED ORDER — SODIUM CHLORIDE 0.9 % IR SOLN
Status: DC | PRN
  Administered 2017-02-13: 1000 mL

## 2017-02-13 MED ORDER — ROCURONIUM BROMIDE 10 MG/ML (PF) SYRINGE
PREFILLED_SYRINGE | INTRAVENOUS | Status: AC
  Filled 2017-02-13: qty 5

## 2017-02-13 MED ORDER — LIDOCAINE-EPINEPHRINE 1 %-1:100000 IJ SOLN
INTRAMUSCULAR | Status: AC
  Filled 2017-02-13: qty 1

## 2017-02-13 MED ORDER — ARIPIPRAZOLE 10 MG PO TABS
15.0000 mg | ORAL_TABLET | Freq: Every day | ORAL | Status: DC
  Filled 2017-02-13: qty 1

## 2017-02-13 MED ORDER — PROPOFOL 10 MG/ML IV BOLUS
INTRAVENOUS | Status: AC
  Filled 2017-02-13: qty 20

## 2017-02-13 MED ORDER — DIVALPROEX SODIUM 500 MG PO DR TAB
500.0000 mg | DELAYED_RELEASE_TABLET | Freq: Every day | ORAL | Status: DC
  Filled 2017-02-13: qty 1

## 2017-02-13 MED ORDER — ONDANSETRON HCL 4 MG/2ML IJ SOLN: 4.0000 mg | Freq: Once | INTRAMUSCULAR | Status: DC | PRN

## 2017-02-13 MED ORDER — METFORMIN HCL 500 MG PO TABS
500.0000 mg | ORAL_TABLET | Freq: Two times a day (BID) | ORAL | Status: DC
  Administered 2017-02-13: 500 mg via ORAL
  Filled 2017-02-13: qty 1

## 2017-02-13 MED ORDER — ACETAMINOPHEN 325 MG PO TABS: 650.0000 mg | ORAL_TABLET | Freq: Four times a day (QID) | ORAL | Status: DC | PRN

## 2017-02-13 MED ORDER — CEPHALEXIN 500 MG PO CAPS: 500.0000 mg | ORAL_CAPSULE | Freq: Two times a day (BID) | ORAL | 0 refills | Status: AC

## 2017-02-13 MED ORDER — ACETAMINOPHEN 325 MG PO TABS
650.0000 mg | ORAL_TABLET | Freq: Four times a day (QID) | ORAL | Status: DC | PRN
  Administered 2017-02-13: 650 mg via ORAL
  Filled 2017-02-13: qty 2

## 2017-02-13 MED ORDER — FENTANYL CITRATE (PF) 250 MCG/5ML IJ SOLN
INTRAMUSCULAR | Status: AC
  Filled 2017-02-13: qty 5

## 2017-02-13 MED ORDER — HYDROCODONE-ACETAMINOPHEN 5-325 MG PO TABS
ORAL_TABLET | ORAL | Status: AC
  Filled 2017-02-13: qty 1

## 2017-02-13 MED ORDER — VANCOMYCIN HCL 10 G IV SOLR: 1500.0000 mg | Freq: Once | INTRAVENOUS | Status: DC

## 2017-02-13 MED ORDER — HYDROMORPHONE HCL 1 MG/ML IJ SOLN
0.2500 mg | INTRAMUSCULAR | Status: DC | PRN
Start: 2017-02-13 — End: 2017-02-13

## 2017-02-13 MED ORDER — HYDROCODONE-ACETAMINOPHEN 5-325 MG PO TABS: 1.0000 | ORAL_TABLET | ORAL | Status: DC | PRN

## 2017-02-13 MED ORDER — MAGNESIUM HYDROXIDE 400 MG/5ML PO SUSP: 15.0000 mL | Freq: Every day | ORAL | Status: DC | PRN

## 2017-02-13 MED ORDER — FENTANYL CITRATE (PF) 100 MCG/2ML IJ SOLN
INTRAMUSCULAR | Status: DC | PRN
  Administered 2017-02-13: 150 ug via INTRAVENOUS
  Administered 2017-02-13: 50 ug via INTRAVENOUS

## 2017-02-13 MED ORDER — MIDAZOLAM HCL 2 MG/2ML IJ SOLN
INTRAMUSCULAR | Status: AC
  Filled 2017-02-13: qty 2

## 2017-02-13 MED ORDER — EPINEPHRINE HCL (NASAL) 0.1 % NA SOLN
NASAL | Status: AC
  Filled 2017-02-13: qty 30

## 2017-02-13 MED ORDER — MEPERIDINE HCL 25 MG/ML IJ SOLN: 6.2500 mg | INTRAMUSCULAR | Status: DC | PRN

## 2017-02-13 MED ORDER — LACTATED RINGERS IV SOLN
INTRAVENOUS | Status: DC
  Administered 2017-02-13: 10:00:00 via INTRAVENOUS

## 2017-02-13 MED ORDER — ONDANSETRON HCL 4 MG/2ML IJ SOLN: 4.0000 mg | INTRAMUSCULAR | Status: DC | PRN

## 2017-02-13 MED ORDER — EPINEPHRINE HCL (NASAL) 0.1 % NA SOLN
NASAL | Status: DC | PRN
  Administered 2017-02-13: 1 [drp] via NASAL

## 2017-02-13 MED ORDER — ONDANSETRON HCL 4 MG PO TABS: 4.0000 mg | ORAL_TABLET | ORAL | Status: DC | PRN

## 2017-02-13 MED ORDER — SUGAMMADEX SODIUM 500 MG/5ML IV SOLN
INTRAVENOUS | Status: AC
  Filled 2017-02-13: qty 5

## 2017-02-13 MED ORDER — POTASSIUM CHLORIDE CRYS ER 20 MEQ PO TBCR
20.0000 meq | EXTENDED_RELEASE_TABLET | Freq: Two times a day (BID) | ORAL | Status: DC
  Administered 2017-02-13: 20 meq via ORAL
  Filled 2017-02-13: qty 1

## 2017-02-13 MED ORDER — LIDOCAINE 2% (20 MG/ML) 5 ML SYRINGE
INTRAMUSCULAR | Status: DC | PRN
  Administered 2017-02-13: 100 mg via INTRAVENOUS

## 2017-02-13 MED ORDER — CHLORHEXIDINE GLUCONATE CLOTH 2 % EX PADS: 6.0000 | MEDICATED_PAD | Freq: Once | CUTANEOUS | Status: DC

## 2017-02-13 MED ORDER — OXYMETAZOLINE HCL 0.05 % NA SOLN
NASAL | Status: DC | PRN
  Administered 2017-02-13: 1

## 2017-02-13 MED ORDER — ONDANSETRON HCL 4 MG/2ML IJ SOLN
INTRAMUSCULAR | Status: DC | PRN
  Administered 2017-02-13: 4 mg via INTRAVENOUS

## 2017-02-13 MED ORDER — ONDANSETRON HCL 4 MG/2ML IJ SOLN
INTRAMUSCULAR | Status: AC
  Filled 2017-02-13: qty 2

## 2017-02-13 MED ORDER — CEFAZOLIN SODIUM-DEXTROSE 1-4 GM/50ML-% IV SOLN
1.0000 g | Freq: Three times a day (TID) | INTRAVENOUS | Status: DC
  Administered 2017-02-13: 1 g via INTRAVENOUS
  Filled 2017-02-13 (×4): qty 50

## 2017-02-13 MED ORDER — DEXTROSE 5 % IV SOLN
3.0000 g | Freq: Once | INTRAVENOUS | Status: AC
  Administered 2017-02-13: 3 g via INTRAVENOUS
  Filled 2017-02-13: qty 3000

## 2017-02-13 MED ORDER — OXYMETAZOLINE HCL 0.05 % NA SOLN
NASAL | Status: AC
  Filled 2017-02-13: qty 15

## 2017-02-13 MED ORDER — DEXAMETHASONE SODIUM PHOSPHATE 10 MG/ML IJ SOLN
INTRAMUSCULAR | Status: DC | PRN
  Administered 2017-02-13: 10 mg via INTRAVENOUS

## 2017-02-13 MED ORDER — PROPOFOL 10 MG/ML IV BOLUS
INTRAVENOUS | Status: DC | PRN
  Administered 2017-02-13: 80 mg via INTRAVENOUS
  Administered 2017-02-13: 50 mg via INTRAVENOUS
  Administered 2017-02-13: 30 mg via INTRAVENOUS

## 2017-02-13 MED ORDER — KCL IN DEXTROSE-NACL 20-5-0.45 MEQ/L-%-% IV SOLN
INTRAVENOUS | Status: DC
  Administered 2017-02-13: 16:00:00 via INTRAVENOUS
  Filled 2017-02-13: qty 1000

## 2017-02-13 MED ORDER — SUGAMMADEX SODIUM 200 MG/2ML IV SOLN
INTRAVENOUS | Status: DC | PRN
  Administered 2017-02-13: 300 mg via INTRAVENOUS

## 2017-02-13 MED ORDER — ARTIFICIAL TEARS OPHTHALMIC OINT
TOPICAL_OINTMENT | OPHTHALMIC | Status: AC
  Filled 2017-02-13: qty 3.5

## 2017-02-13 MED ORDER — ROCURONIUM BROMIDE 10 MG/ML (PF) SYRINGE
PREFILLED_SYRINGE | INTRAVENOUS | Status: DC | PRN
  Administered 2017-02-13: 50 mg via INTRAVENOUS

## 2017-02-13 MED ORDER — LIDOCAINE 2% (20 MG/ML) 5 ML SYRINGE
INTRAMUSCULAR | Status: AC
  Filled 2017-02-13: qty 5

## 2017-02-13 MED ORDER — DEXAMETHASONE SODIUM PHOSPHATE 10 MG/ML IJ SOLN
INTRAMUSCULAR | Status: AC
  Filled 2017-02-13: qty 1

## 2017-02-13 MED ORDER — SUCCINYLCHOLINE CHLORIDE 200 MG/10ML IV SOSY
PREFILLED_SYRINGE | INTRAVENOUS | Status: AC
  Filled 2017-02-13: qty 10

## 2017-02-13 MED ORDER — ACETAMINOPHEN 325 MG PO TABS
ORAL_TABLET | ORAL | Status: AC
  Administered 2017-02-13: 325 mg
  Filled 2017-02-13: qty 2

## 2017-02-13 MED ORDER — IBUPROFEN 100 MG/5ML PO SUSP
400.0000 mg | Freq: Four times a day (QID) | ORAL | Status: DC | PRN
  Filled 2017-02-13: qty 20

## 2017-02-13 MED ORDER — HEMOSTATIC AGENTS (NO CHARGE) OPTIME
TOPICAL | Status: DC | PRN
Start: 2017-02-13 — End: 2017-02-13
  Administered 2017-02-13: 1 via TOPICAL

## 2017-02-13 MED ORDER — LIDOCAINE-EPINEPHRINE 1 %-1:100000 IJ SOLN
INTRAMUSCULAR | Status: DC | PRN
Start: 2017-02-13 — End: 2017-02-13
  Administered 2017-02-13: 9 mL

## 2017-02-13 SURGICAL SUPPLY — 60 items
ATTRACTOMAT 16X20 MAGNETIC DRP (DRAPES) ×4 IMPLANT
BLADE 10 SAFETY STRL DISP (BLADE) IMPLANT
BLADE EYE SICKLE 84 5 BEAV (BLADE) ×3 IMPLANT
BLADE EYE SICKLE 84 5MM BEAV (BLADE) ×1
BLADE RAD40 ROTATE 4M 4 5PK (BLADE) IMPLANT
BLADE RAD40 ROTATE 4M 4MM 5PK (BLADE)
BLADE RAD60 ROTATE M4 4 5PK (BLADE) IMPLANT
BLADE RAD60 ROTATE M4 4MM 5PK (BLADE)
BLADE ROTATE TRICUT 4MX13CM M4 (BLADE) ×1
BLADE ROTATE TRICUT 4X13 M4 (BLADE) ×3 IMPLANT
BLADE SURG 15 STRL LF DISP TIS (BLADE) ×2 IMPLANT
BLADE SURG 15 STRL SS (BLADE) ×2
BLADE TRICUT ROTATE M4 4 5PK (BLADE) IMPLANT
BLADE TRICUT ROTATE M4 4MM 5PK (BLADE)
CANISTER SUCT 3000ML PPV (MISCELLANEOUS) ×4 IMPLANT
CLEANER TIP ELECTROSURG 2X2 (MISCELLANEOUS) ×4 IMPLANT
COAGULATOR SUCT 6 FR SWTCH (ELECTROSURGICAL) ×1
COAGULATOR SUCT 8FR VV (MISCELLANEOUS) ×4 IMPLANT
COAGULATOR SUCT SWTCH 10FR 6 (ELECTROSURGICAL) ×3 IMPLANT
CONT SPEC 4OZ CLIKSEAL STRL BL (MISCELLANEOUS) ×4 IMPLANT
COVER SURGICAL LIGHT HANDLE (MISCELLANEOUS) ×4 IMPLANT
DRAPE HALF SHEET 40X57 (DRAPES) IMPLANT
DRESSING NASAL KENNEDY 3.5X.9 (MISCELLANEOUS) IMPLANT
DRSG NASAL KENNEDY 3.5X.9 (MISCELLANEOUS)
DRSG TELFA 3X8 NADH (GAUZE/BANDAGES/DRESSINGS) IMPLANT
ELECT COATED BLADE 2.86 ST (ELECTRODE) IMPLANT
ELECT REM PT RETURN 9FT ADLT (ELECTROSURGICAL)
ELECTRODE REM PT RTRN 9FT ADLT (ELECTROSURGICAL) IMPLANT
FILTER ARTHROSCOPY CONVERTOR (FILTER) IMPLANT
GLOVE BIOGEL PI IND STRL 7.0 (GLOVE) ×2 IMPLANT
GLOVE BIOGEL PI INDICATOR 7.0 (GLOVE) ×2
GLOVE ECLIPSE 6.5 STRL STRAW (GLOVE) ×4 IMPLANT
GLOVE SS BIOGEL STRL SZ 7.5 (GLOVE) ×4 IMPLANT
GLOVE SUPERSENSE BIOGEL SZ 7.5 (GLOVE) ×4
GOWN STRL REUS W/ TWL LRG LVL3 (GOWN DISPOSABLE) ×4 IMPLANT
GOWN STRL REUS W/ TWL XL LVL3 (GOWN DISPOSABLE) ×2 IMPLANT
GOWN STRL REUS W/TWL LRG LVL3 (GOWN DISPOSABLE) ×4
GOWN STRL REUS W/TWL XL LVL3 (GOWN DISPOSABLE) ×2
HEMOSTAT SNOW SURGICEL 2X4 (HEMOSTASIS) ×4 IMPLANT
KIT BASIN OR (CUSTOM PROCEDURE TRAY) ×4 IMPLANT
KIT ROOM TURNOVER OR (KITS) ×4 IMPLANT
MARKER SPHERE PSV REFLC NDI (MISCELLANEOUS) ×20 IMPLANT
NEEDLE HYPO 25GX1X1/2 BEV (NEEDLE) IMPLANT
NEEDLE PRECISIONGLIDE 27X1.5 (NEEDLE) ×4 IMPLANT
NEEDLE SPNL 25GX3.5 QUINCKE BL (NEEDLE) IMPLANT
NS IRRIG 1000ML POUR BTL (IV SOLUTION) ×4 IMPLANT
PAD ARMBOARD 7.5X6 YLW CONV (MISCELLANEOUS) ×8 IMPLANT
PATTIES SURGICAL .5 X3 (DISPOSABLE) ×8 IMPLANT
PENCIL BUTTON HOLSTER BLD 10FT (ELECTRODE) ×4 IMPLANT
SOLUTION ANTI FOG 6CC (MISCELLANEOUS) ×4 IMPLANT
SPECIMEN JAR SMALL (MISCELLANEOUS) ×8 IMPLANT
SPLINT NASAL DOYLE BI-VL (GAUZE/BANDAGES/DRESSINGS) IMPLANT
SUT CHROMIC 2 0 SH (SUTURE) ×4 IMPLANT
SUT CHROMIC 4 0 RB 1X27 (SUTURE) ×4 IMPLANT
TOWEL OR 17X24 6PK STRL BLUE (TOWEL DISPOSABLE) ×4 IMPLANT
TRACKER ENT PATIENT (MISCELLANEOUS) ×4 IMPLANT
TRAY ENT MC OR (CUSTOM PROCEDURE TRAY) ×4 IMPLANT
TUBE CONNECTING 12'X1/4 (SUCTIONS) ×1
TUBE CONNECTING 12X1/4 (SUCTIONS) ×3 IMPLANT
WATER STERILE IRR 1000ML POUR (IV SOLUTION) ×4 IMPLANT

## 2017-02-13 NOTE — Transfer of Care (Signed)
Immediate Anesthesia Transfer of Care Note  Patient: Jodi [name]  Procedure(s) Performed: Procedure(s): ENDOSCOPIC SINUS ENDO WITH FUSION (N/A) SINUSOTOMY VIA CALDWELL LUC (Right)  Patient Location: PACU  Anesthesia Type:General  Level of Consciousness: awake, alert , drowsy and patient cooperative  Airway & Oxygen Therapy: Patient Spontanous Breathing and Patient connected to face mask oxygen  Post-op Assessment: Report given to RN, Post -op Vital signs reviewed and stable and Patient moving all extremities X 4  Post vital signs: Reviewed and stable  Last Vitals:  Vitals:   02/13/17 0918 02/13/17 1253  BP: 125/76 (!) 141/82  Pulse: 67 94  Resp: 20 19  Temp: 36.5 C 36.5 C    Last Pain: There were no vitals filed for this visit.    Patients Stated Pain Goal: 0 (02/17/55 1537)  Complications: No apparent anesthesia complications

## 2017-02-13 NOTE — Anesthesia Postprocedure Evaluation (Signed)
Anesthesia Post Note  Patient: Jodi [name]  Procedure(s) Performed: Procedure(s) (LRB): ENDOSCOPIC SINUS ENDO WITH FUSION (N/A) SINUSOTOMY VIA CALDWELL LUC (Right)  Patient location during evaluation: PACU Anesthesia Type: General Level of consciousness: awake and alert Pain management: pain level controlled Vital Signs Assessment: post-procedure vital signs reviewed and stable Respiratory status: spontaneous breathing, nonlabored ventilation, respiratory function stable and patient connected to nasal cannula oxygen Cardiovascular status: blood pressure returned to baseline and stable Postop Assessment: no signs of nausea or vomiting Anesthetic complications: no       Last Vitals:  Vitals:   02/13/17 1408 02/13/17 1423  BP: 131/76 130/78  Pulse: 82 88  Resp: 18 20  Temp:      Last Pain:  Vitals:   02/13/17 1430  PainSc: 6                  Avarey Yaeger DAVID

## 2017-02-13 NOTE — Anesthesia Preprocedure Evaluation (Signed)
Anesthesia Evaluation  Patient identified by MRN, date of birth, ID band Patient awake    Reviewed: Allergy & Precautions, NPO status , Patient's Chart, lab work & pertinent test results  History of Anesthesia Complications Negative for: history of anesthetic complications  Airway Mallampati: II   Neck ROM: Full    Dental  (+) Teeth Intact, Missing, Dental Advisory Given   Pulmonary shortness of breath, with exertion, lying and Long-Term Oxygen Therapy, sleep apnea and Oxygen sleep apnea , former smoker,  Pul HTN   breath sounds clear to auscultation + decreased breath sounds      Cardiovascular Exercise Tolerance: Poor + angina + Peripheral Vascular Disease, +CHF and + DOE   Rhythm:Regular Rate:Normal  TAA   Neuro/Psych  Headaches, PSYCHIATRIC DISORDERS Depression Bipolar Disorder    GI/Hepatic negative GI ROS, (+)     substance abuse  ,   Endo/Other  diabetes, Type 2, Oral Hypoglycemic AgentsMorbid obesity  Renal/GU negative Renal ROS     Musculoskeletal  (+) Arthritis ,   Abdominal (+) + obese,  Abdomen: soft. Bowel sounds: normal.  Peds  Hematology   Anesthesia Other Findings   Reproductive/Obstetrics                             - Cardiologist is Vear Clock, MD, who cleared pt for surgery at last office visit 01/23/17. Note indicates pt clinically stable from diastolic CHF, has lost ~62 lbs in last year.    - Hospitalized 2/28-11/19/15 and 3/17-23/17 for acute on chronic diastolic HF, mod-severe pulmonary HTN, medical noncompliance. Pt saw pulmonology during 2nd hospital stay.  Vilinda Boehringer, MD note 12/05/15 indicates pt does not have pulmonary arterial HTN and doesn't require vasodilators, but instead likely has OSA, obesity hypoventilation syndrome, and acute on chronic cor pulmonale.    EKG 02/07/17: Sinus rhythm with sinus arrhythmia with occasional PVCs. Non-specific ST-t changes.    Echo 01/25/17:  1. Poor echo window, wall motion with reduced sensitivity. LV cavity normal in size. Normal global wall motion. Visual EF 55-60%. Unable to determine diastolic function due to lack of E/e'. D-shaped septum in diastole suggests RV volume overload. Calculated EF 58% 2. LA cavity is mildly dilated by dimension 3. RA cavity is mildly dilated 4. RV  cavity is mild to moderately dilated. Poor visualization of RV and function probably preserved 5. Mild cath patient of the aortic valve annulus. Mild aortic valve stenosis. Aortic valve peak gradient of 17 mmHg, mean gradient 10 mmHg, calculated aortic valve area by planimetry 1.33 cm. 6. Trace tricuspid regurgitation. Unable to estimate PA pressure due to absent/minimal TR jet  Echo 11/18/15:  - Left ventricle: Poor echo window due to patient&'s bodily habitus.The cavity size was normal. Wall thickness was increased in apattern of mild LVH. Systolic function was normal. The estimatedejection fraction was in the range of 55% to 60%. The study is not technically sufficient to allow evaluation of LV diastolic function. - Ventricular septum: The contour showed diastolic flattening and systolic flattening. - Mitral valve: Calcified annulus. Mildly thickened leaflets . - Right ventricle: The cavity size was moderately to severelydilated. Systolic function was moderately reduced. - Right atrium: The atrium was severely dilated. - Pulmonary arteries: PA peak pressure: 65 mm Hg (S). - Compared to the study done on 09/15/2015, although rightventricular dimension normal, personal evaluation reveals theright ventricle probably enlarged. Not well visualized. Thepresent study is much better visualization. There was nosignificant tricuspid regurgitation  previously to measure the PApressure. However grossly the systolic and diastolic septal flattening is clearly new. - Impressions:The right ventricular systolic pressure was increased consistent  with severe pulmonary hypertension.   Anesthesia Physical Anesthesia Plan  ASA: III  Anesthesia Plan: General   Post-op Pain Management:    Induction: Intravenous  Airway Management Planned:   Additional Equipment:   Intra-op Plan:   Post-operative Plan: Possible Post-op intubation/ventilation  Informed Consent: I have reviewed the patients History and Physical, chart, labs and discussed the procedure including the risks, benefits and alternatives for the proposed anesthesia with the patient or authorized representative who has indicated his/her understanding and acceptance.   Dental advisory given  Plan Discussed with: CRNA, Anesthesiologist and Surgeon  Anesthesia Plan Comments:         Anesthesia Quick Evaluation

## 2017-02-13 NOTE — Brief Op Note (Signed)
02/13/2017  12:35 PM  PATIENT:  Jodi [name]  56 y.o. female  PRE-OPERATIVE DIAGNOSIS:  neoplasm of maxillary sinus  POST-OPERATIVE DIAGNOSIS:  neoplasm of maxillary sinus  PROCEDURE:  Procedure(s): ENDOSCOPIC SINUS ENDO WITH FUSION (N/A) SINUSOTOMY VIA CALDWELL LUC (Right)  SURGEON:  Surgeon(s) and Role:    Rozetta Nunnery, MD - Primary  PHYSICIAN ASSISTANT:   ASSISTANTS: none   ANESTHESIA:   general  EBL:  Total I/O In: -  Out: 150 [Blood:150]  BLOOD ADMINISTERED:none  DRAINS: none   LOCAL MEDICATIONS USED:  XYLOCAINE with EPI  10 cc  SPECIMEN:  Source of Specimen:  right maxillary sinus  DISPOSITION OF SPECIMEN:  PATHOLOGY  COUNTS:  YES  TOURNIQUET:  * No tourniquets in log *  DICTATION: .Other Dictation: Dictation Number [id number]  PLAN OF CARE: Admit for overnight observation  PATIENT DISPOSITION:  PACU - hemodynamically stable.   Delay start of Pharmacological VTE agent (>24hrs) due to surgical blood loss or risk of bleeding: yes

## 2017-02-13 NOTE — OR Nursing (Signed)
Ms. Vasko is non-compliant with using her oxygen.  She is frequently removing it because she says "its too much oxygen".  I explained to her the reasons that she is currently needing oxygen to help her understand.

## 2017-02-13 NOTE — Discharge Instructions (Signed)
Elevate head of bed and apply cool compress to the right cheek to reduce swelling. Diet as tolerated Contact Dr Lucia Gaskins if you have not heard from the [facility] physician by Thursday at 4 pm [phone]

## 2017-02-13 NOTE — Final Progress Note (Signed)
Post Op check Awake Alert Minimal complaints of pain No respiratory problems Drinking well Will discharge home this pm on tylenol and Keflex 500 mg bid Stable post op

## 2017-02-13 NOTE — Anesthesia Procedure Notes (Signed)
Procedure Name: Intubation Date/Time: 02/13/2017 10:21 AM Performed by: Mervyn Gay Pre-anesthesia Checklist: Patient identified, Patient being monitored, Timeout performed, Emergency Drugs available and Suction available Patient Re-evaluated:Patient Re-evaluated prior to inductionOxygen Delivery Method: Circle System Utilized Preoxygenation: Pre-oxygenation with 100% oxygen Intubation Type: IV induction Ventilation: Mask ventilation without difficulty Laryngoscope Size: 3 and Mac Grade View: Grade I Tube type: Oral Tube size: 7.5 mm Number of attempts: 1 Airway Equipment and Method: Stylet and Patient positioned with wedge pillow Placement Confirmation: ETT inserted through vocal cords under direct vision,  positive ETCO2 and breath sounds checked- equal and bilateral Secured at: 22 cm Tube secured with: Tape Dental Injury: Teeth and Oropharynx as per pre-operative assessment

## 2017-02-13 NOTE — Interval H&P Note (Signed)
History and Physical Interval Note:  02/13/2017 9:32 AM  Jodi [name]  has presented today for surgery, with the diagnosis of neoplasm of maxillary sinus  The various methods of treatment have been discussed with the patient and family. After consideration of risks, benefits and other options for treatment, the patient has consented to  Procedure(s): ENDOSCOPIC SINUS ENDO WITH FUSION (N/A) SINUSOTOMY VIA CALDWELL LUC (Right) as a surgical intervention .  The patient's history has been reviewed, patient examined, no change in status, stable for surgery.  I have reviewed the patient's chart and labs.  Questions were answered to the patient's satisfaction.     Cristan Scherzer

## 2017-02-14 ENCOUNTER — Encounter (HOSPITAL_COMMUNITY): Payer: Self-pay | Admitting: Otolaryngology

## 2017-02-14 NOTE — Op Note (Signed)
Jodi [name], Jodi [name]                ACCOUNT NO.:  [id number]  MEDICAL RECORD NO.:  [id number]  LOCATION:                                 FACILITY:  PHYSICIAN:  Leonides Sake. Lucia Gaskins, M.D. DATE OF BIRTH:  DATE OF PROCEDURE:  02/13/2017 DATE OF DISCHARGE:                              OPERATIVE REPORT   PREOPERATIVE DIAGNOSIS:  Right maxillary sinus neoplasm.  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  OPERATION PERFORMED:  Right Caldwell-Luc procedure with a biopsy of the right maxillary sinus mass.  SURGEON:  Leonides Sake. Lucia Gaskins, M.D.  ANESTHESIA:  General endotracheal.  ESTIMATED BLOOD LOSS:  150 mL.  COMPLICATIONS:  None.  BRIEF CLINICAL NOTE:  Jodi [name] is a 56 year old female who has had chronic right-sided nasal obstruction for about 6 months.  She had a CT scan which showed a large right maxillary sinus mass, extending into the right nasal cavity and actually deforming the septum to the left side. 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  She was taken to the operating room at this time for right Caldwell-Luc procedure and excision of mass.  DESCRIPTION OF PROCEDURE:  After adequate endotracheal anesthesia, the nose was prepped with Betadine solution and draped out with sterile towels.  The right nasal cavity and right sublabial area were injected with 10 mL of Xylocaine with epinephrine for hemostasis.  On exam, the right nasal cavity was completely obstructed with mass.  Left nasal cavity was clear with no intranasal masses noted, although the septum was deviated to the left side.  A sublingual incision was performed on the right side of standard Caldwell-Luc procedure.  Dissection was carried down to the maxilla bone.  Some of the bone was missing.  The mass was quickly encountered that  occupied the majority of the maxillary sinus.  Septal fragments of the mass was obtained initially for frozen section and while awaiting frozen section, more fragments of the mass were removed, it bled rather easily with hemostasis controlled with cotton pledgets soaked in Afrin as well as 1:1000 epinephrine.  Suction cautery was also used to help control some of the bleeding as the mass was very friable and bled easily and was clinically consistent with probable carcinoma.  Just a small portion of the mass was removed and sent for frozen as well as permanent section.  Following report, the frozen section revealed findings consistent with squamous cell carcinoma.  Hemostasis was obtained with suction cautery as well as a cotton pledgets soaked in topical Afrin as well as 1:1000 adrenaline. After obtaining adequate hemostasis, the small cavity where the biopsies were performed was packed with snow.  The Caldwell-Luc incision was closed with 2-0 chromic sutures submucosally and then a running 4-0 chromic suture to reapproximate the mucosal edges.  This completed the procedure.  The patient had a little bit of bleeding from the right nasal cavity.  It was controlled adequately with a little bit of topical snow packing.  The patient was subsequently awakened from anesthesia and transferred to the recovery room, postoperatively doing  well.  DISPOSITION:  She will be admitted for overnight observation because of health issues as well as poor pulmonary function with low O2's.  We will plan on discharge tomorrow and will refer the patient to [facility] for further surgical treatment of the carcinoma of the maxillary sinus.    ______________________________ Leonides Sake Lucia Gaskins, M.D.   ______________________________ Leonides Sake. Lucia Gaskins, M.D.    CEN/MEDQ  D:  02/13/2017  T:  02/14/2017  Job:  [id number]  cc:   Leonides Sake. Lucia Gaskins, M.D.'s Office

## 2017-04-16 IMAGING — CT CT ANGIO CHEST
3 of 6 series · 12 of 36 positions shown · IV contrast (Omni 300)
Comparison: Chest radiograph of November 16, 2015.

CLINICAL DATA: Acute dyspnea.  Chest pain.

EXAM:
CT ANGIOGRAPHY CHEST WITH CONTRAST
TECHNIQUE: Multidetector CT imaging of the chest was performed using the
standard protocol during bolus administration of intravenous
contrast. Multiplanar CT image reconstructions and MIPs were
obtained to evaluate the vascular anatomy.
CONTRAST:  100mL OMNIPAQUE IOHEXOL 350 MG/ML SOLN

[Series 4: pe 3mm · axial · 0.66mm/px · z∈[-244,-94]mm · 3 of 101 slices shown]
[im 26/101  lung]
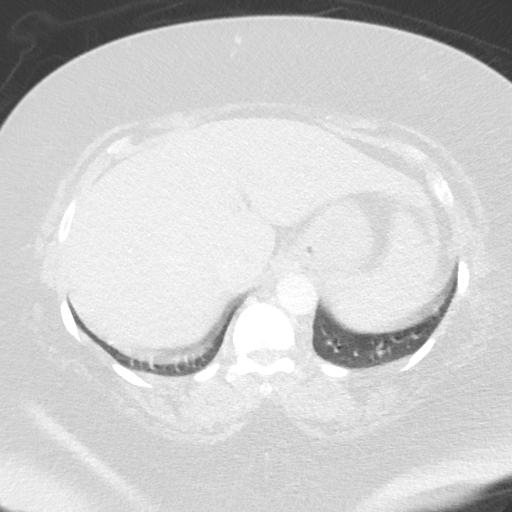
[im 51/101  mediastinal]
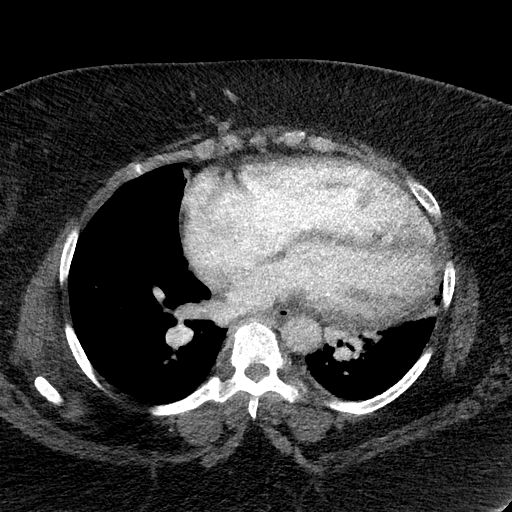
[im 76/101  lung]
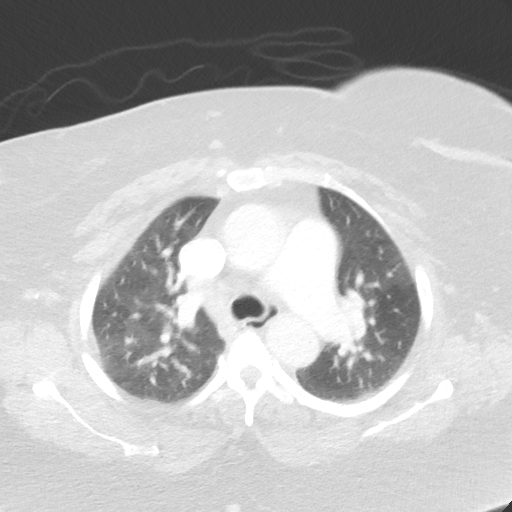

[Series 6: pe thins · axial · 0.66mm/px · z∈[-292,-46]mm · 8 of 429 slices shown]
[im 39/429  lung]
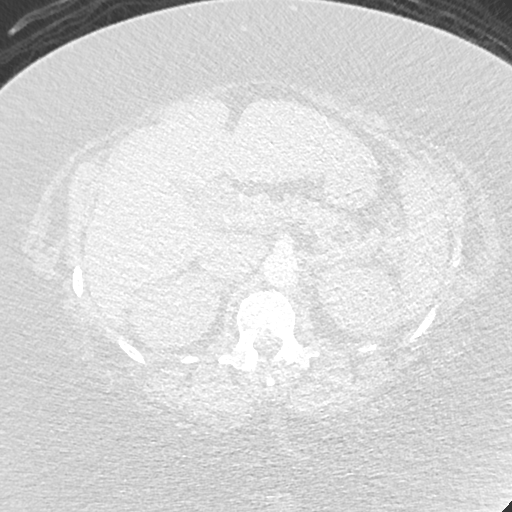
[im 78/429  lung]
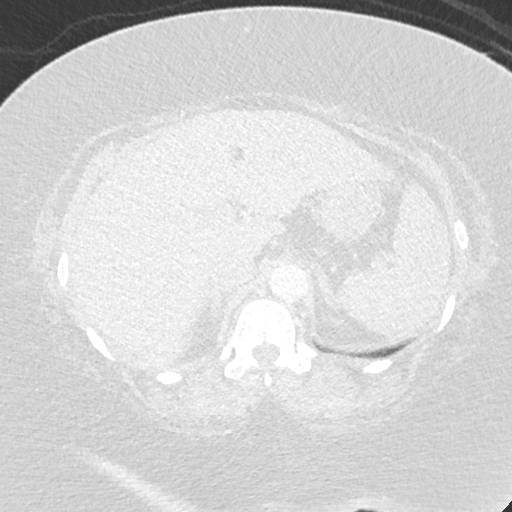
[im 137/429  lung]
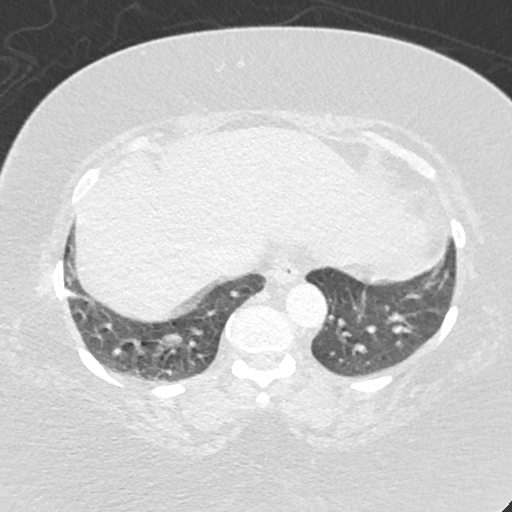
[im 195/429  lung]
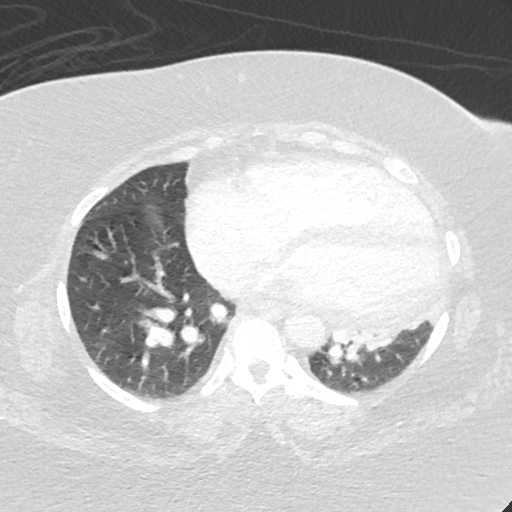
[im 234/429  lung]
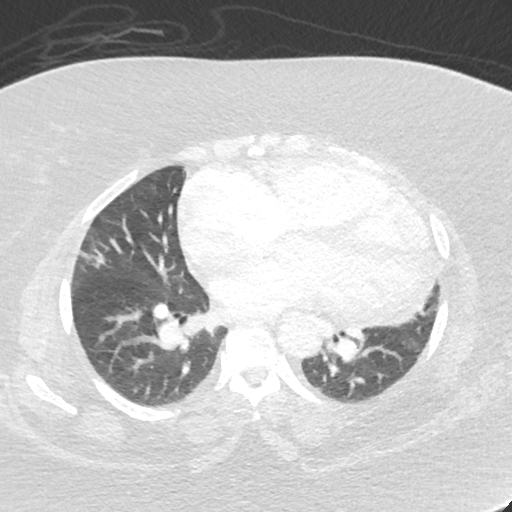
[im 292/429  lung]
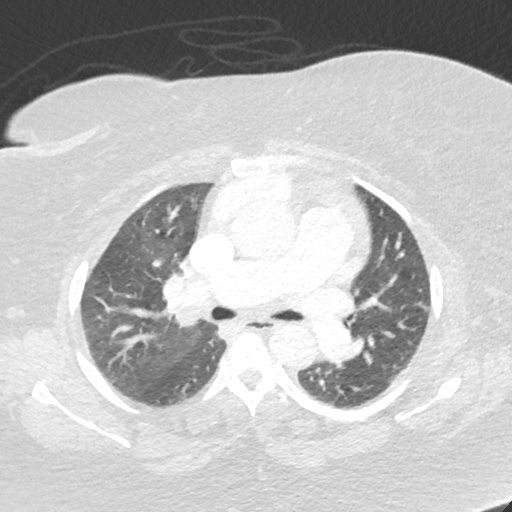
[im 351/429  lung]
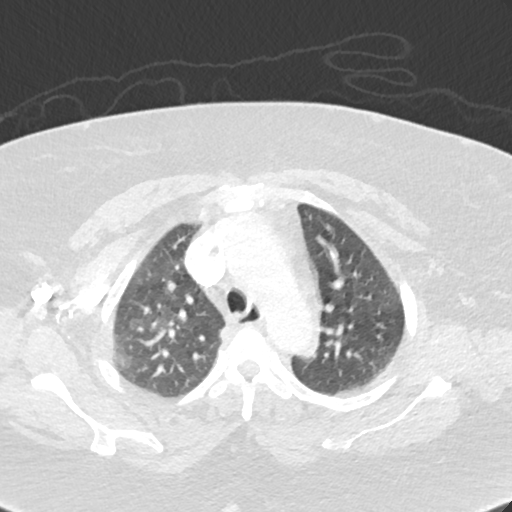
[im 390/429  lung]
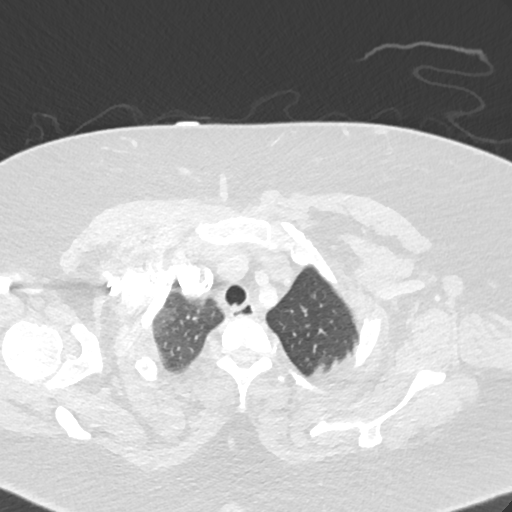

[Series 7: pe 2mm cor · coronal · 0.59mm/px · 1 of 90 slices shown]
[im 45/90  mediastinal]
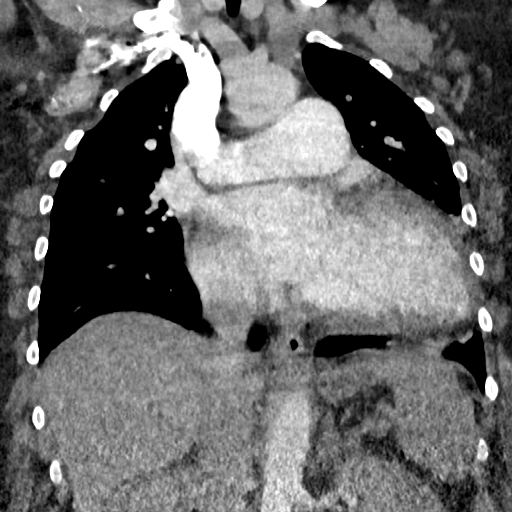

[12 of 36 positions shown; findings below may reference images not displayed]

FINDINGS: Moderate cardiomegaly is noted. No pneumothorax or pleural effusion
is noted. Minimal left lower lobe subsegmental atelectasis is noted.
Suboptimal enhancement of the pulmonary arteries is noted. There is
no definite evidence of large central pulmonary embolus, but smaller
peripheral emboli cannot be excluded on the basis of this exam.
cm ascending thoracic aortic aneurysm is noted. Enlargement of the
pulmonary arteries is also noted suggesting pulmonary hypertension.
No mediastinal mass or adenopathy is noted. Visualized portion of
upper abdomen is unremarkable. No significant osseous abnormality is
noted.

Review of the MIP images confirms the above findings.
IMPRESSION: Suboptimal contrast opacification of pulmonary arteries is noted. No
large central pulmonary embolus is noted, but smaller peripheral
pulmonary emboli cannot be excluded on the basis of this study.
Enlargement of the pulmonary arteries is noted suggesting pulmonary
artery hypertension.

Moderate cardiomegaly is noted.

4.1 cm ascending thoracic aortic aneurysm. Recommend annual imaging
followup by CTA or MRA. This recommendation follows 3505
ACCF/AHA/AATS/ACR/ASA/SCA/RESZEGI/YASEMIN/YOSHIOKA/MATINEZ Guidelines for the
Diagnosis and Management of Patients with Thoracic Aortic Disease.
Circulation. 3505; 121: [id number].

## 2017-05-03 ENCOUNTER — Encounter (HOSPITAL_COMMUNITY): Payer: Self-pay | Admitting: Emergency Medicine

## 2017-05-03 ENCOUNTER — Emergency Department (HOSPITAL_COMMUNITY)
Admission: EM | Admit: 2017-05-03 | Discharge: 2017-05-03 | Disposition: A | Discharge status: Home / Self Care | Payer: Medicare Other | Attending: Emergency Medicine | Admitting: Emergency Medicine

## 2017-05-03 DIAGNOSIS — I110 Hypertensive heart disease with heart failure: Secondary | ICD-10-CM | POA: Insufficient documentation

## 2017-05-03 DIAGNOSIS — Z87891 Personal history of nicotine dependence: Secondary | ICD-10-CM | POA: Diagnosis not present

## 2017-05-03 DIAGNOSIS — I5033 Acute on chronic diastolic (congestive) heart failure: Secondary | ICD-10-CM | POA: Diagnosis not present

## 2017-05-03 DIAGNOSIS — K859 Acute pancreatitis without necrosis or infection, unspecified: Secondary | ICD-10-CM

## 2017-05-03 DIAGNOSIS — E119 Type 2 diabetes mellitus without complications: Secondary | ICD-10-CM | POA: Diagnosis not present

## 2017-05-03 DIAGNOSIS — R1013 Epigastric pain: Secondary | ICD-10-CM | POA: Diagnosis present

## 2017-05-03 DIAGNOSIS — Z7984 Long term (current) use of oral hypoglycemic drugs: Secondary | ICD-10-CM | POA: Diagnosis not present

## 2017-05-03 DIAGNOSIS — Z79899 Other long term (current) drug therapy: Secondary | ICD-10-CM | POA: Insufficient documentation

## 2017-05-03 LAB — COMPREHENSIVE METABOLIC PANEL
ALT: 35 U/L (ref 14–54)
AST: 26 U/L (ref 15–41)
Albumin: 3.5 g/dL (ref 3.5–5.0)
Alkaline Phosphatase: 71 U/L (ref 38–126)
Anion gap: 11 (ref 5–15)
BUN: 19 mg/dL (ref 6–20)
CO2: 30 mmol/L (ref 22–32)
Calcium: 9.2 mg/dL (ref 8.9–10.3)
Chloride: 100 mmol/L — ABNORMAL LOW (ref 101–111)
Creatinine, Ser: 0.77 mg/dL (ref 0.44–1.00)
GFR calc Af Amer: >60 mL/min (ref >60)
GFR calc non Af Amer: >60 mL/min (ref >60)
Glucose, Bld: 151 mg/dL — ABNORMAL HIGH (ref 65–99)
Potassium: 3.4 mmol/L — ABNORMAL LOW (ref 3.5–5.1)
Sodium: 141 mmol/L (ref 135–145)
Total Bilirubin: 0.8 mg/dL (ref 0.3–1.2)
Total Protein: 7.8 g/dL (ref 6.5–8.1)

## 2017-05-03 LAB — CBC
HCT: 46.5 % — ABNORMAL HIGH (ref 36.0–46.0)
Hemoglobin: 15.4 g/dL — ABNORMAL HIGH (ref 12.0–15.0)
MCH: 31.7 pg (ref 26.0–34.0)
MCHC: 33.1 g/dL (ref 30.0–36.0)
MCV: 95.7 fL (ref 78.0–100.0)
Platelets: 177 10*3/uL (ref 150–400)
RBC: 4.86 MIL/uL (ref 3.87–5.11)
RDW: 13 % (ref 11.5–15.5)
WBC: 14.5 10*3/uL — ABNORMAL HIGH (ref 4.0–10.5)

## 2017-05-03 LAB — LIPASE, BLOOD: Lipase: 661 U/L — ABNORMAL HIGH (ref 11–51)

## 2017-05-03 MED ORDER — ACETAMINOPHEN ER 650 MG PO TBCR: 650.0000 mg | EXTENDED_RELEASE_TABLET | Freq: Three times a day (TID) | ORAL | 0 refills | Status: DC | PRN

## 2017-05-03 MED ORDER — ONDANSETRON HCL 4 MG/2ML IJ SOLN
4.0000 mg | Freq: Once | INTRAMUSCULAR | Status: AC
  Administered 2017-05-03: 4 mg via INTRAVENOUS
  Filled 2017-05-03: qty 2

## 2017-05-03 MED ORDER — HYDROCODONE-ACETAMINOPHEN 5-325 MG PO TABS: 1.0000 | ORAL_TABLET | Freq: Four times a day (QID) | ORAL | 0 refills | Status: DC | PRN

## 2017-05-03 MED ORDER — SODIUM CHLORIDE 0.9 % IV SOLN: 1000.0000 mL | INTRAVENOUS | Status: DC

## 2017-05-03 MED ORDER — MORPHINE SULFATE (PF) 2 MG/ML IV SOLN
4.0000 mg | Freq: Once | INTRAVENOUS | Status: DC
  Filled 2017-05-03: qty 2

## 2017-05-03 MED ORDER — KETOROLAC TROMETHAMINE 30 MG/ML IJ SOLN
15.0000 mg | Freq: Once | INTRAMUSCULAR | Status: DC
Start: 2017-05-03 — End: 2017-05-03

## 2017-05-03 MED ORDER — SODIUM CHLORIDE 0.9 % IV BOLUS (SEPSIS)
500.0000 mL | Freq: Once | INTRAVENOUS | Status: AC
  Administered 2017-05-03: 500 mL via INTRAVENOUS

## 2017-05-03 MED ORDER — ONDANSETRON HCL 4 MG PO TABS: 4.0000 mg | ORAL_TABLET | Freq: Three times a day (TID) | ORAL | 0 refills | Status: DC | PRN

## 2017-05-03 NOTE — ED Triage Notes (Addendum)
Patient here from home with complaints of abdominal pain and leg pain x2 days. Nausea/vomiting.  O2 as needed at home. 88% in route. Cancer patient.

## 2017-05-03 NOTE — Discharge Instructions (Signed)
It is very important that you have only liquids the next several days until your pain improves. You may gradually introduce solid foods into your diet as your pain improves. Eat small meals throughout the day. Avoid any fatty or spicy foods. Use Tylenol as needed for pain control. Use Zofran as needed for nausea or vomiting. Follow-up with your primary care doctor in 1 week if her pain is not improving. Return to the emergency room if he develops fever, chills, significantly worsening pain, persistent vomiting, or any new or worsening symptoms.

## 2017-05-03 NOTE — ED Provider Notes (Signed)
[place] DEPT Provider Note   CSN: [id number] Arrival date & time: 05/03/17  0945     History   Chief Complaint Chief Complaint  Patient presents with  . Abdominal Pain  . Leg Pain    HPI Jodi [name] is a 56 y.o. female presenting with acute onset abdominal pain, nausea, vomiting.  Patient states that yesterday afternoon, she had acute onset abdominal pain, nausea, and vomiting. She describes her pain as being cramping. It is generalized, but worse in the epigastrium. She took some antacid last night with mild relief, but the pain returned severely this morning when she tried to check a couple of TTP. She states she has not had anything to eat or drink since the pain started. She denies fevers or chills. She has not taken today reviewed with the patient. She reports she had one soft diarrhea-like bowel movement this morning without blood. She denies urinary symptoms. No one else at home is sick. She states she had nothing to eat yesterday, even before her pain began. She has had an nephrectomy, but no other abdominal surgeries. 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   HPI  Past Medical History:  Diagnosis Date  . Anginal pain ([place])   . Atypical chest pain   . Bipolar disorder ([place])   . CHF (congestive heart failure) ([place])   . Chronic back pain   . Chronic knee pain   . Cor pulmonale (chronic) ([place])   . Depression   . Diabetes mellitus    Type II  . Dyspnea    with fluid retention; with activity  . Headache   . Hyperlipemia   . Malignant neoplasm of maxillary sinus ([place]) 02/13/2017  . Moderate to severe pulmonary hypertension ([place])   . Morbid obesity ([place])   . Obesity hypoventilation syndrome ([place])   . On home oxygen therapy    PRN  . Pickwickian syndrome ([place])   . Thoracic aortic aneurysm [facility])     Patient Active Problem List   Diagnosis Date Noted  . Malignant neoplasm of maxillary sinus ([place])  02/13/2017  . Bipolar disorder, in partial remission, most recent episode manic ([place])   . Pulmonary hypertension ([place])   . Acute respiratory failure with hypoxia and hypercarbia (HCC)   . Morbid obesity due to excess calories ([place])   . Nocturnal hypoxemia due to obesity   . Acute on chronic diastolic (congestive) heart failure ([place]) 12/03/2015  . CHF (congestive heart failure) ([place]) 11/18/2015  . Non compliance with medical treatment 09/16/2015  . Bipolar disorder ([place]) 09/15/2015  . Type 2 diabetes mellitus ([place]) 09/15/2015  . Morbid obesity ([place]) 09/15/2015    Past Surgical History:  Procedure Laterality Date  . RIGHT OOPHORECTOMY    . SINUS ENDO WITH FUSION N/A 02/13/2017   Procedure: ENDOSCOPIC SINUS ENDO WITH FUSION;  Surgeon: Rozetta Nunnery, MD;  Location: [facility] OR;  Service: ENT;  Laterality: N/A;  . SINUSOTOMY Right 02/13/2017   Procedure: [place];  Surgeon: Rozetta Nunnery, MD;  Location: [place];  Service: ENT;  Laterality: Right;  . TONSILLECTOMY      OB History    No data available       Home Medications    Prior to Admission medications   Medication Sig Start Date End Date Taking? Authorizing Provider  acetaminophen (TYLENOL) 325 MG tablet Take 650 mg by mouth every 6 (six) hours as needed (for pain.).  Yes [provider]  ARIPiprazole (ABILIFY) 15 MG tablet Take 15 mg by mouth at bedtime.   Yes [provider]  divalproex (DEPAKOTE) 500 MG DR tablet Take 500 mg by mouth at bedtime.   Yes [provider]  furosemide (LASIX) 40 MG tablet Take 2 tablets (80 mg total) by mouth 2 (two) times daily. 09/16/15  Yes Rama, Venetia Maxon, MD  magnesium hydroxide (MILK OF MAGNESIA) 400 MG/5ML suspension Take 15 mLs by mouth daily as needed for mild constipation.   Yes [provider]  metFORMIN (GLUCOPHAGE) 500 MG tablet Take 500 mg by mouth 2 (two) times daily with a meal.   Yes [provider]    potassium chloride SA (K-DUR,KLOR-CON) 20 MEQ tablet Take 1 tablet (20 mEq total) by mouth 2 (two) times daily. 11/19/15  Yes Adrian Prows, MD  Tetrahydrozoline HCl (GOODSENSE EYE DROPS OP) Place 1 drop into both eyes daily. Tetrahydrozoline HCL 0.05%    Yes [provider]  acetaminophen (TYLENOL 8 HOUR) 650 MG CR tablet Take 1 tablet (650 mg total) by mouth every 8 (eight) hours as needed for pain. 05/03/17   Sedrick Tober, PA-C  HYDROcodone-acetaminophen (NORCO/VICODIN) 5-325 MG tablet Take 1 tablet by mouth every 6 (six) hours as needed for severe pain. 05/03/17   Jeniyah Menor, PA-C  ondansetron (ZOFRAN) 4 MG tablet Take 1 tablet (4 mg total) by mouth every 8 (eight) hours as needed for nausea or vomiting. 05/03/17   Monicia Tse, PA-C    Family History Family History  Problem Relation Age of Onset  . Diabetes Other     Social History Social History  Substance Use Topics  . Smoking status: Former Research scientist (life sciences)  . Smokeless tobacco: Never Used     Comment: greater than 14 years ago  . Alcohol use 0.0 oz/week     Comment: rarely     Allergies   Penicillins   Review of Systems Review of Systems  Gastrointestinal: Positive for abdominal pain, diarrhea, nausea and vomiting.  All other systems reviewed and are negative.    Physical Exam Updated Vital Signs BP 120/71 (BP Location: Left Arm)   Pulse 72   Temp 98.7 F (37.1 C) (Oral)   Resp 16   SpO2 92%   Physical Exam  Constitutional: She is oriented to person, place, and time. She appears well-developed and well-nourished. No distress.  HENT:  Head: Normocephalic and atraumatic.  Eyes: EOM are normal.  Neck: Normal range of motion.  Cardiovascular: Normal rate, regular rhythm and intact distal pulses.   Pulmonary/Chest: Effort normal and breath sounds normal. No respiratory distress. She has no wheezes.  Abdominal: Soft. Bowel sounds are normal. She exhibits no distension and no mass. There is tenderness.  There is no rigidity, no rebound, no guarding, no CVA tenderness, no tenderness at McBurney's point and negative Murphy's sign.  Patient reports tenderness in all quadrants. No visible signs of distress with palpation.  Musculoskeletal: Normal range of motion.  Lymphadenopathy:    She has no cervical adenopathy.  Neurological: She is alert and oriented to person, place, and time.  Skin: Skin is warm and dry.  Psychiatric: She has a normal mood and affect.  Nursing note and vitals reviewed.    ED Treatments / Results  Labs (all labs ordered are listed, but only abnormal results are displayed) Labs Reviewed  LIPASE, BLOOD - Abnormal; Notable for the following:       Result Value   Lipase 661 (*)  All other components within normal limits  COMPREHENSIVE METABOLIC PANEL - Abnormal; Notable for the following:    Potassium 3.4 (*)    Chloride 100 (*)    Glucose, Bld 151 (*)    All other components within normal limits  CBC - Abnormal; Notable for the following:    WBC 14.5 (*)    Hemoglobin 15.4 (*)    HCT 46.5 (*)    All other components within normal limits  URINALYSIS, ROUTINE W REFLEX MICROSCOPIC  POC URINE PREG, ED    EKG  EKG Interpretation None       Radiology No results found.  Procedures Procedures (including critical care time)  Medications Ordered in ED Medications  sodium chloride 0.9 % bolus 500 mL (0 mLs Intravenous Stopped 05/03/17 1342)    Followed by  0.9 %  sodium chloride infusion (not administered)  ketorolac (TORADOL) 30 MG/ML injection 15 mg (not administered)  ondansetron (ZOFRAN) injection 4 mg (4 mg Intravenous Given 05/03/17 1203)     Initial Impression / Assessment and Plan / ED Course  I have reviewed the triage vital signs and the nursing notes.  Pertinent labs & imaging results that were available during my care of the patient were reviewed by me and considered in my medical decision making (see chart for details).     Patient  presented with acute onset nausea vomiting and abdominal pain. Pain was slightly improved with antacid last night, but returned this morning. Physical exam reassuring, as patient has no obvious distress with palpation of the abdomen. Patient follow sleep throughout history and physical, but easily arouses to voice. Will order basic abdominal labs, and start IVF, Zofran, and pain medicine for symptom control. Case discussed with attending, and Dr. Wilson Singer evaluated the patient.   Patient states she does not morphine, as she feels this is too strong. She reports she is feeling improvement with just Zofran and fluids. Labs show lipase of 661, and an elevated white count of 14.5, but otherwise reassuring. Discussed findings with patient, and possible diagnosis of pancreatitis. Patient states she only rarely drinks alcohol, but she has been eating a lot of fried foods.. Will try PO challenge.  Patient was able to tolerate ginger ale. At this time, I do not believe she needs further imaging. Patient's daughter states patient will be getting an MRI of upper body tomorrow from the otolaryngologist to ensure no metastases from her current cancer. Will discharge patient with prescription for Zofran, Tylenol, and small dose of Norco. Discussed at length importance of diet in terms of symptom control. Patient to maintain a clear liquid diet until symptoms improve, and slowly progress to solid foods. Patient to avoid fatty foods. At this time, patient appears safe for discharge. Return precautions given. Patient states she understands and agrees to plan.   Final Clinical Impressions(s) / ED Diagnoses   Final diagnoses:  Acute pancreatitis, unspecified complication status, unspecified pancreatitis type    New Prescriptions Discharge Medication List as of 05/03/2017  1:22 PM    START taking these medications   Details  acetaminophen (TYLENOL 8 HOUR) 650 MG CR tablet Take 1 tablet (650 mg total) by mouth every 8  (eight) hours as needed for pain., Starting Thu 05/03/2017, Print    HYDROcodone-acetaminophen (NORCO/VICODIN) 5-325 MG tablet Take 1 tablet by mouth every 6 (six) hours as needed for severe pain., Starting Thu 05/03/2017, Print    ondansetron (ZOFRAN) 4 MG tablet Take 1 tablet (4 mg total) by mouth every  8 (eight) hours as needed for nausea or vomiting., Starting Thu 05/03/2017, Print         [place], [place], PA-C 05/03/17 1515    [name], MD 05/03/17 1655

## 2017-05-06 ENCOUNTER — Emergency Department (HOSPITAL_COMMUNITY): Payer: Medicare Other

## 2017-05-06 ENCOUNTER — Emergency Department (HOSPITAL_COMMUNITY)
Admission: EM | Admit: 2017-05-06 | Discharge: 2017-05-06 | Disposition: A | Discharge status: Home / Self Care | Payer: Medicare Other | Attending: Emergency Medicine | Admitting: Emergency Medicine

## 2017-05-06 ENCOUNTER — Encounter (HOSPITAL_COMMUNITY): Payer: Self-pay | Admitting: Emergency Medicine

## 2017-05-06 DIAGNOSIS — C310 Malignant neoplasm of maxillary sinus: Secondary | ICD-10-CM | POA: Diagnosis not present

## 2017-05-06 DIAGNOSIS — R112 Nausea with vomiting, unspecified: Secondary | ICD-10-CM | POA: Insufficient documentation

## 2017-05-06 DIAGNOSIS — Z79899 Other long term (current) drug therapy: Secondary | ICD-10-CM | POA: Diagnosis not present

## 2017-05-06 DIAGNOSIS — Z87891 Personal history of nicotine dependence: Secondary | ICD-10-CM | POA: Insufficient documentation

## 2017-05-06 DIAGNOSIS — R1084 Generalized abdominal pain: Secondary | ICD-10-CM | POA: Diagnosis present

## 2017-05-06 DIAGNOSIS — E119 Type 2 diabetes mellitus without complications: Secondary | ICD-10-CM | POA: Insufficient documentation

## 2017-05-06 DIAGNOSIS — Z7984 Long term (current) use of oral hypoglycemic drugs: Secondary | ICD-10-CM | POA: Diagnosis not present

## 2017-05-06 DIAGNOSIS — F319 Bipolar disorder, unspecified: Secondary | ICD-10-CM | POA: Insufficient documentation

## 2017-05-06 DIAGNOSIS — K859 Acute pancreatitis without necrosis or infection, unspecified: Secondary | ICD-10-CM | POA: Diagnosis not present

## 2017-05-06 DIAGNOSIS — I509 Heart failure, unspecified: Secondary | ICD-10-CM | POA: Insufficient documentation

## 2017-05-06 DIAGNOSIS — E662 Morbid (severe) obesity with alveolar hypoventilation: Secondary | ICD-10-CM | POA: Diagnosis not present

## 2017-05-06 DIAGNOSIS — R109 Unspecified abdominal pain: Secondary | ICD-10-CM

## 2017-05-06 LAB — COMPREHENSIVE METABOLIC PANEL
ALT: 22 U/L (ref 14–54)
AST: 14 U/L — ABNORMAL LOW (ref 15–41)
Albumin: 2.6 g/dL — ABNORMAL LOW (ref 3.5–5.0)
Alkaline Phosphatase: 82 U/L (ref 38–126)
Anion gap: 6 (ref 5–15)
BUN: 17 mg/dL (ref 6–20)
CO2: 34 mmol/L — ABNORMAL HIGH (ref 22–32)
Calcium: 8.7 mg/dL — ABNORMAL LOW (ref 8.9–10.3)
Chloride: 97 mmol/L — ABNORMAL LOW (ref 101–111)
Creatinine, Ser: 0.78 mg/dL (ref 0.44–1.00)
GFR calc Af Amer: >60 mL/min (ref >60)
GFR calc non Af Amer: >60 mL/min (ref >60)
Glucose, Bld: 145 mg/dL — ABNORMAL HIGH (ref 65–99)
Potassium: 3.8 mmol/L (ref 3.5–5.1)
Sodium: 137 mmol/L (ref 135–145)
Total Bilirubin: 0.7 mg/dL (ref 0.3–1.2)
Total Protein: 6.9 g/dL (ref 6.5–8.1)

## 2017-05-06 LAB — CBC
HCT: 41.8 % (ref 36.0–46.0)
Hemoglobin: 13.8 g/dL (ref 12.0–15.0)
MCH: 31.6 pg (ref 26.0–34.0)
MCHC: 33 g/dL (ref 30.0–36.0)
MCV: 95.7 fL (ref 78.0–100.0)
Platelets: 162 10*3/uL (ref 150–400)
RBC: 4.37 MIL/uL (ref 3.87–5.11)
RDW: 12.6 % (ref 11.5–15.5)
WBC: 14.8 10*3/uL — ABNORMAL HIGH (ref 4.0–10.5)

## 2017-05-06 LAB — LIPASE, BLOOD: Lipase: 59 U/L — ABNORMAL HIGH (ref 11–51)

## 2017-05-06 MED ORDER — HYDROCODONE-ACETAMINOPHEN 5-325 MG PO TABS: 2.0000 | ORAL_TABLET | ORAL | 0 refills | Status: DC | PRN

## 2017-05-06 MED ORDER — SODIUM CHLORIDE 0.9 % IV BOLUS (SEPSIS)
1000.0000 mL | Freq: Once | INTRAVENOUS | Status: AC
  Administered 2017-05-06: 1000 mL via INTRAVENOUS

## 2017-05-06 MED ORDER — HYDROMORPHONE HCL 1 MG/ML IJ SOLN
1.0000 mg | Freq: Once | INTRAMUSCULAR | Status: AC
  Administered 2017-05-06: 1 mg via INTRAVENOUS
  Filled 2017-05-06: qty 1

## 2017-05-06 MED ORDER — IOPAMIDOL (ISOVUE-300) INJECTION 61%
INTRAVENOUS | Status: AC
  Administered 2017-05-06: 100 mL
  Filled 2017-05-06: qty 100

## 2017-05-06 NOTE — ED Notes (Signed)
Pt verbalized understanding discharge instructions and denies any further needs or questions at this time. VS stable, ambulatory and steady gait.   

## 2017-05-06 NOTE — ED Provider Notes (Signed)
[place] DEPT Provider Note   CSN: [id number] Arrival date & time: 05/06/17  1454   By signing my name below, I, Jodi [name], attest that this documentation has been prepared under the direction and in the presence of [name], MD. Electronically signed, Jodi [name], ED Scribe. 05/06/17. 5:49 PM.   History   Chief Complaint Chief Complaint  Patient presents with  . Abdominal Pain  . Pancreatitis   The history is provided by the patient and medical records. No language interpreter was used.    Jodi [name] is a 56 y.o. female with h/o DM presenting to the Emergency Department concerning persistent generalized abdominal pain x ~5 days. She was evaluated for this issue in [facility] ED on 05/03/2017 and discharged with Rx for nausea medications. Nausea, some vomiting, decreased urination, constipation and decreased appetite associated. She describes fluctuating, intermittent pain worse with palpation, certain positions/ movements and sometimes with eating. Pt states she has been unable to keep solids and fluids down. Daughter states the pt has been eating canned soups, but expresses concern because the pt has been advised by her PCP to follow a low sodium diet. Prescribed nausea medication from previous evaluation in [facility] ED and tylenol have not provide relief to pain or vomiting. Records indicate pt was recently diagnosed with pancreatitis. Pt on supplemental oxygen PRN at home. No dysuria, diarrhea or any other complaints noted at this time.   Past Medical History:  Diagnosis Date  . Anginal pain ([place])   . Atypical chest pain   . Bipolar disorder ([place])   . CHF (congestive heart failure) ([place])   . Chronic back pain   . Chronic knee pain   . Cor pulmonale (chronic) ([place])   . Depression   . Diabetes mellitus    Type II  . Dyspnea    with fluid retention; with activity  . Headache   . Hyperlipemia   . Malignant neoplasm of maxillary sinus ([place]) 02/13/2017  . Moderate to  severe pulmonary hypertension ([place])   . Morbid obesity ([place])   . Obesity hypoventilation syndrome ([place])   . On home oxygen therapy    PRN  . Pickwickian syndrome ([place])   . Thoracic aortic aneurysm [facility])     Patient Active Problem List   Diagnosis Date Noted  . Malignant neoplasm of maxillary sinus ([place]) 02/13/2017  . Bipolar disorder, in partial remission, most recent episode manic ([place])   . Pulmonary hypertension ([place])   . Acute respiratory failure with hypoxia and hypercarbia (HCC)   . Morbid obesity due to excess calories ([place])   . Nocturnal hypoxemia due to obesity   . Acute on chronic diastolic (congestive) heart failure ([place]) 12/03/2015  . CHF (congestive heart failure) ([place]) 11/18/2015  . Non compliance with medical treatment 09/16/2015  . Bipolar disorder ([place]) 09/15/2015  . Type 2 diabetes mellitus ([place]) 09/15/2015  . Morbid obesity ([place]) 09/15/2015    Past Surgical History:  Procedure Laterality Date  . RIGHT OOPHORECTOMY    . SINUS ENDO WITH FUSION N/A 02/13/2017   Procedure: ENDOSCOPIC SINUS ENDO WITH FUSION;  Surgeon: Rozetta Nunnery, MD;  Location: [facility] OR;  Service: ENT;  Laterality: N/A;  . SINUSOTOMY Right 02/13/2017   Procedure: [place];  Surgeon: Rozetta Nunnery, MD;  Location: [place];  Service: ENT;  Laterality: Right;  . TONSILLECTOMY      OB History    No data available       Home Medications  Prior to Admission medications   Medication Sig Start Date End Date Taking? Authorizing Provider  acetaminophen (TYLENOL 8 HOUR) 650 MG CR tablet Take 1 tablet (650 mg total) by mouth every 8 (eight) hours as needed for pain. 05/03/17   [place], Sophia, PA-C  acetaminophen (TYLENOL) 325 MG tablet Take 650 mg by mouth every 6 (six) hours as needed (for pain.).    [provider]  ARIPiprazole (ABILIFY) 15 MG tablet Take 15 mg by mouth at bedtime.    [provider]  divalproex (DEPAKOTE) 500 MG DR tablet Take  500 mg by mouth at bedtime.    [provider]  furosemide (LASIX) 40 MG tablet Take 2 tablets (80 mg total) by mouth 2 (two) times daily. 09/16/15   Rama, Venetia Maxon, MD  HYDROcodone-acetaminophen (NORCO/VICODIN) 5-325 MG tablet Take 1 tablet by mouth every 6 (six) hours as needed for severe pain. 05/03/17   Caccavale, Sophia, PA-C  magnesium hydroxide (MILK OF MAGNESIA) 400 MG/5ML suspension Take 15 mLs by mouth daily as needed for mild constipation.    [provider]  metFORMIN (GLUCOPHAGE) 500 MG tablet Take 500 mg by mouth 2 (two) times daily with a meal.    [provider]  ondansetron (ZOFRAN) 4 MG tablet Take 1 tablet (4 mg total) by mouth every 8 (eight) hours as needed for nausea or vomiting. 05/03/17   Caccavale, Sophia, PA-C  potassium chloride SA (K-DUR,KLOR-CON) 20 MEQ tablet Take 1 tablet (20 mEq total) by mouth 2 (two) times daily. 11/19/15   Adrian Prows, MD  Tetrahydrozoline HCl (GOODSENSE EYE DROPS OP) Place 1 drop into both eyes daily. Tetrahydrozoline HCL 0.05%     [provider]    Family History Family History  Problem Relation Age of Onset  . Diabetes Other     Social History Social History  Substance Use Topics  . Smoking status: Former Research scientist (life sciences)  . Smokeless tobacco: Never Used     Comment: greater than 14 years ago  . Alcohol use 0.0 oz/week     Comment: rarely     Allergies   Penicillins   Review of Systems Review of Systems  Constitutional: Positive for appetite change.  Gastrointestinal: Positive for abdominal pain, constipation, nausea and vomiting. Negative for diarrhea.  Genitourinary: Positive for decreased urine volume. Negative for difficulty urinating, dysuria, frequency and urgency.  All other systems reviewed and are negative.    Physical Exam Updated Vital Signs BP 108/73   Pulse 72   Temp 98.3 F (36.8 C) (Oral)   Resp 20   SpO2 (!) 88% Comment: pt reports she wears o2 prn  Physical Exam    Constitutional: She is oriented to person, place, and time. She appears well-developed and well-nourished.  HENT:  Head: Normocephalic.  Eyes: EOM are normal.  Neck: Normal range of motion.  Pulmonary/Chest: Effort normal.  Abdominal: Soft. She exhibits no distension. There is generalized tenderness. There is no rebound and no guarding.  Obese abdomen. Mild diffuse tenderness. No rebound or guarding.  Musculoskeletal: Normal range of motion.  Neurological: She is alert and oriented to person, place, and time.  Psychiatric: She has a normal mood and affect.  Nursing note and vitals reviewed.    ED Treatments / Results  DIAGNOSTIC STUDIES: Oxygen Saturation is 88% on RA, low by my interpretation.    COORDINATION OF CARE: 5:08 PM-Discussed next steps with pt. Pt verbalized understanding and is agreeable with the plan. Will order C/T scan, IV fluids and  medications.   Labs (all labs ordered are listed, but only abnormal results are displayed) Labs Reviewed  LIPASE, BLOOD - Abnormal; Notable for the following:       Result Value   Lipase 59 (*)    All other components within normal limits  COMPREHENSIVE METABOLIC PANEL - Abnormal; Notable for the following:    Chloride 97 (*)    CO2 34 (*)    Glucose, Bld 145 (*)    Calcium 8.7 (*)    Albumin 2.6 (*)    AST 14 (*)    All other components within normal limits  CBC - Abnormal; Notable for the following:    WBC 14.8 (*)    All other components within normal limits  URINALYSIS, ROUTINE W REFLEX MICROSCOPIC    EKG  EKG Interpretation None       Radiology No results found.  Procedures Procedures (including critical care time)  Medications Ordered in ED Medications - No data to display   Initial Impression / Assessment and Plan / ED Course  I have reviewed the triage vital signs and the nursing notes.  Pertinent labs & imaging results that were available during my care of the patient were reviewed by me and  considered in my medical decision making (see chart for details).     55yF with abdominal pain. I actually saw her a couple days ago at [facility]. Symptoms not worse, but not much better. Imaging done today. Consistent with pancreatitis. Lipase almost normalized. lfts remain fine. She still appears well. Says she feels much better. Tolerating PO. Afebrile. Prescribe six vicodin two days ago. Will give script for a couple more days. Says she has enough zofran.   Final Clinical Impressions(s) / ED Diagnoses   Final diagnoses:  Abdominal pain, unspecified abdominal location  Acute pancreatitis, unspecified complication status, unspecified pancreatitis type    New Prescriptions New Prescriptions   No medications on file    I personally preformed the services scribed in my presence. The recorded information has been reviewed is accurate. [name], MD.    [name], MD 05/23/17 1120

## 2017-05-06 NOTE — ED Triage Notes (Signed)
Pt reports diagnosed with pancreatitis on 8/16 at [facility], reports pain continues in lower abdomen, states she does not feel like pain is worse but it has not gone away. Pt a/ox4, resp e/u. O2 sat 88%, pt reports wearing O2 prn at home.

## 2017-05-25 ENCOUNTER — Encounter: Payer: Self-pay | Admitting: Radiation Oncology

## 2017-05-28 ENCOUNTER — Telehealth: Payer: Self-pay | Admitting: *Deleted

## 2017-05-28 NOTE — Telephone Encounter (Signed)
Oncology Nurse Navigator Documentation  Placed introductory call to new referral patient Jodi [name].  Introduced myself as the H&N oncology nurse navigator that works with Dr. Isidore Moos to whom she has been referred by Dr. Phoebe Sharps.    She confirmed her understanding of referral and appt date/time of 10/9 0930 NE, 1000 Dr. Isidore Moos.  I briefly explained my role as her navigator, indicated that I would be joining her during her appts.  She noted she is having surgery 9/17 at [facility] to remove sinus blockages.  I provided my contact information, encouraged her to call with questions/concerns prior to her appt.  She verbalized understanding of information provided, expressed appreciation for my call.  Jodi Orem, RN, BSN, [place] Neck Oncology Nurse [place] at [place] [phone]

## 2017-06-01 ENCOUNTER — Ambulatory Visit: Payer: Medicare Other

## 2017-06-01 ENCOUNTER — Ambulatory Visit: Payer: Medicare Other | Admitting: Radiation Oncology

## 2017-06-04 ENCOUNTER — Other Ambulatory Visit: Payer: Self-pay | Admitting: Otolaryngology

## 2017-06-18 ENCOUNTER — Ambulatory Visit: Payer: Medicare Other | Admitting: Radiation Oncology

## 2017-06-22 NOTE — Progress Notes (Signed)
error 

## 2017-06-26 ENCOUNTER — Ambulatory Visit: Admission: RE | Admit: 2017-06-26 | Payer: Medicare Other | Source: Ambulatory Visit

## 2017-06-26 ENCOUNTER — Ambulatory Visit
Admission: RE | Admit: 2017-06-26 | Discharge: 2017-06-26 | Disposition: A | Discharge status: Home / Self Care | Payer: Medicare Other | Source: Ambulatory Visit | Attending: Radiation Oncology | Admitting: Radiation Oncology

## 2017-06-26 ENCOUNTER — Telehealth: Payer: Self-pay

## 2017-06-26 DIAGNOSIS — E119 Type 2 diabetes mellitus without complications: Secondary | ICD-10-CM | POA: Insufficient documentation

## 2017-06-26 DIAGNOSIS — C310 Malignant neoplasm of maxillary sinus: Secondary | ICD-10-CM | POA: Insufficient documentation

## 2017-06-26 DIAGNOSIS — Z7984 Long term (current) use of oral hypoglycemic drugs: Secondary | ICD-10-CM | POA: Insufficient documentation

## 2017-06-26 DIAGNOSIS — Z9981 Dependence on supplemental oxygen: Secondary | ICD-10-CM | POA: Insufficient documentation

## 2017-06-26 DIAGNOSIS — Z880 Allergy status to penicillin: Secondary | ICD-10-CM | POA: Insufficient documentation

## 2017-06-26 DIAGNOSIS — E785 Hyperlipidemia, unspecified: Secondary | ICD-10-CM | POA: Insufficient documentation

## 2017-06-26 DIAGNOSIS — I272 Pulmonary hypertension, unspecified: Secondary | ICD-10-CM | POA: Insufficient documentation

## 2017-06-26 DIAGNOSIS — Z87891 Personal history of nicotine dependence: Secondary | ICD-10-CM | POA: Insufficient documentation

## 2017-06-26 DIAGNOSIS — I509 Heart failure, unspecified: Secondary | ICD-10-CM | POA: Insufficient documentation

## 2017-06-26 DIAGNOSIS — F319 Bipolar disorder, unspecified: Secondary | ICD-10-CM | POA: Insufficient documentation

## 2017-06-26 DIAGNOSIS — E662 Morbid (severe) obesity with alveolar hypoventilation: Secondary | ICD-10-CM | POA: Insufficient documentation

## 2017-06-26 DIAGNOSIS — I712 Thoracic aortic aneurysm, without rupture: Secondary | ICD-10-CM | POA: Insufficient documentation

## 2017-06-26 NOTE — Telephone Encounter (Signed)
I called [name] today regarding her consult appointment with Dr. Isidore Moos for earlier today at 9:30. I spoke to her sister with [name] communicating with her in the room. She was not aware of today's appointment per her report. I transferred her to our scheduler to reschedule. She voiced her appreciation. I have made [name], head and neck navigator aware.

## 2017-06-26 NOTE — Progress Notes (Signed)
Head and Neck Cancer Location of Tumor / Histology:  02/13/17 Diagnosis 1. Sinus mucosa, biopsy, right maxillary mass - SQUAMOUS CELL CARCINOMA, SEE COMMENT. 2. Soft tissue mass, simple excision, Right maxillary - SQUAMOUS CELL CARCINOMA, SEE COMMENT.  Patient presented  months ago with symptoms of: She had a history of nasal congestion and nose bleeds going back to January 2018, with an previous episode in July 2017  Biopsies of sinus mucosa, right maxillary mass revealed: Squamous cell carcinoma.   Nutrition Status Yes No Comments  Weight changes? [x]  []  She has lost about 30- 40 lbs since her surgery  Swallowing concerns? [x]  []  She is only able to swallow liquids and soft foods.   PEG? []  [x]     Referrals Yes No Comments  Social Work? []  [x]    Dentistry? []  [x]    Swallowing therapy? []  [x]    Nutrition? []  [x]    Med/Onc? []  [x]     Safety Issues Yes No Comments  Prior radiation? []  [x]    Pacemaker/ICD? []  [x]    Possible current pregnancy? []  [x]    Is the patient on methotrexate? []  [x]     Tobacco/Marijuana/Snuff/ETOH use: She is a former cigarette smoker. No smokeless tobacco use. Alcohol use rare.   Past/Anticipated interventions by otolaryngology, if any:  OR Procedures:  06/04/2017 - approach and resection of a right sinus and infratemporal fossa squamous cell carcinoma with right scapular tip free flap reconstruction  Dr. Myrtis Ser [facility].    Past/Anticipated interventions by medical oncology, if any:No   Current Complaints / other details:    BP 93/64   Temp 98 F (36.7 C)   Ht 5\' 4"  (1.626 m)   Wt (!) 301 lb 9.6 oz (136.8 kg)   SpO2 100% Comment: 2v Liters  BMI 51.77 kg/m   Wt Readings from Last 3 Encounters:  06/29/17 (!) 301 lb 9.6 oz (136.8 kg)  02/07/17 (!) 343 lb 3.2 oz (155.7 kg)  02/15/16 (!) 336 lb (152.4 kg)

## 2017-06-29 ENCOUNTER — Ambulatory Visit
Admission: RE | Admit: 2017-06-29 | Discharge: 2017-06-29 | Disposition: A | Discharge status: Home / Self Care | Payer: Medicare Other | Source: Ambulatory Visit | Attending: Radiation Oncology | Admitting: Radiation Oncology

## 2017-06-29 ENCOUNTER — Encounter: Payer: Self-pay | Admitting: *Deleted

## 2017-06-29 DIAGNOSIS — I712 Thoracic aortic aneurysm, without rupture: Secondary | ICD-10-CM | POA: Diagnosis not present

## 2017-06-29 DIAGNOSIS — E785 Hyperlipidemia, unspecified: Secondary | ICD-10-CM | POA: Diagnosis not present

## 2017-06-29 DIAGNOSIS — Z9981 Dependence on supplemental oxygen: Secondary | ICD-10-CM | POA: Diagnosis not present

## 2017-06-29 DIAGNOSIS — I509 Heart failure, unspecified: Secondary | ICD-10-CM | POA: Diagnosis not present

## 2017-06-29 DIAGNOSIS — Z880 Allergy status to penicillin: Secondary | ICD-10-CM | POA: Diagnosis not present

## 2017-06-29 DIAGNOSIS — E662 Morbid (severe) obesity with alveolar hypoventilation: Secondary | ICD-10-CM | POA: Diagnosis not present

## 2017-06-29 DIAGNOSIS — F319 Bipolar disorder, unspecified: Secondary | ICD-10-CM | POA: Diagnosis not present

## 2017-06-29 DIAGNOSIS — C310 Malignant neoplasm of maxillary sinus: Secondary | ICD-10-CM | POA: Diagnosis not present

## 2017-06-29 DIAGNOSIS — Z7984 Long term (current) use of oral hypoglycemic drugs: Secondary | ICD-10-CM | POA: Diagnosis not present

## 2017-06-29 DIAGNOSIS — I272 Pulmonary hypertension, unspecified: Secondary | ICD-10-CM | POA: Diagnosis not present

## 2017-06-29 DIAGNOSIS — Z87891 Personal history of nicotine dependence: Secondary | ICD-10-CM | POA: Diagnosis not present

## 2017-06-29 DIAGNOSIS — E119 Type 2 diabetes mellitus without complications: Secondary | ICD-10-CM | POA: Diagnosis not present

## 2017-06-29 NOTE — Progress Notes (Addendum)
Radiation Oncology         ([phone]) [phone] ________________________________  Initial outpatient Consultation  Name: Jodi [name] MRN: [id number]  Date: 06/29/2017  DOB: [birth year]  FX:[id number], Dola Factor, MD  Aundra Millet, MD   REFERRING PHYSICIAN: Aundra Millet, MD  DIAGNOSIS:    ICD-10-CM   1. 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 female who presented with right nasal congestion and node bleeds. She quit tobacco abuse 13 yr ago, prior 1ppd x 51yrs.   Dr. Lucia Gaskins, ENT, ordered a Maxillofacial CT in April revealing Expansile destructive process of the right maxillary sinus expanding/eroding/ destroying the posterolateral wall, anterior wall, inferior wall and medial wall. Bowing and thinning of the superior wall, encroaching upon the inferior orbit, but without gross intraorbital extension   Other imaging - currently trying to obtain access to [facility] imaging: 02/27/17 MRI IMPRESSION: Expansile mass centered in the right maxillary sinus extending into the right nasal cavity and right sphenoethmoid sinus, in keeping with patient's known squamous cell carcinoma of the maxillary sinus. On CT exam of the head dated 01/09/2017, there is absence of posterior osseous margin of the right maxillary sinus, which suggests right pterygopalatine fossa invasion. However, evaluation for perineural spread is limited evaluation in the absence of intravenous contrast.   Inward bowing of the right inferomedial orbital floor without evidence of orbital invasion  05/04/17 CT Interval increase in size of previously identified squamous carcinoma involving the right maxillary sinus with increased mass effect and extension into the  surrounding tissues/bone erosion.   05/04/17 CT Chest --No evidence of thoracic metastasis. --Mild aneurysmal dilatation of the ascending thoracic aorta, 4.0 cm. --Enlarged main pulmonary arteries, which can be seen in setting of pulmonary arterial hypertension. --Cardiomegaly. --Subcentimeter low-attenuation nodules in bilateral thyroid lobes. Recommend ultrasound thyroid for further assessment.   Ultimately the patient underwent surgery at [facility], ENT surgery.  Dr. Ihor Austin and Dr. Charolett Bumpers are her surgeons.    Surgical resection at [facility] on 06-04-17 revealed  NASAL CAVITY AND PARANASAL SINUSES(Nasal Cavity - All Specimens)  SPECIMEN  Procedure:Radical maxillectomy   TUMOR  Tumor Site:Paranasal sinus(es), maxillary   Histologic Type:  :Squamous cell carcinoma, nonkeratinizing   Tumor Laterality:Right   Histologic Grade:G2: Moderately differentiated   Tumor Focality:Unifocal   Tumor Size:Greatest dimension in Centimeters (cm): 6.1 Centimeters (cm)  Additional Dimension in Centimeters (cm):5.3 Centimeters (cm)  Additional Dimension in Centimeters (cm):4.9 Centimeters (cm)  Tumor Extent:  Tumor Extension:Involves maxillary sinus and bone, involves ethmoid sinus   Accessory Findings:  Lymphovascular Invasion:Present   Perineural Invasion:Not identified   MARGINS  Margins:Uninvolved by invasive tumor   Distance from Closest Margin in [place] (mm):Cannot be determined: Final margins submitted separately and uninvolved by tumor   Location of Closest Margin, per Orientation:Cannot be determined: Final margins submitted separately and uninvolved by tumor   Status of Non-Invasive Tumor at Margins:Not applicable   LYMPH NODES  :  Number of Lymph Nodes Involved:0   Number of Lymph Nodes Examined:1   PATHOLOGIC STAGE  CLASSIFICATION (pTNM, AJCC 8th Edition)  TNM Descriptors:Not applicable   :  Primary Tumor (pT):pT3   Regional Lymph Nodes (pN):pN0    Swallowing issues, if any: okay with liquids, soft foods  Weight Changes: 30-40lb  loss post op  Pain status: denies pain  Other symptoms: history of pancreatitis and is tender in LUQ to palpation  Tobacco history, if any: as above  ETOH abuse, if any: rare  Congestion improved after surgery Notes baseline left sided weakness, subtle.   PREVIOUS RADIATION THERAPY: No  PAST MEDICAL HISTORY:  has a past medical history of Anginal pain ([place]); Atypical chest pain; Bipolar disorder ([place]); CHF (congestive heart failure) ([place]); Chronic back pain; Chronic knee pain; Cor pulmonale (chronic) ([place]); Depression; Diabetes mellitus; Dyspnea; Headache; Hyperlipemia; Malignant neoplasm of maxillary sinus ([place]) (02/13/2017); Moderate to severe pulmonary hypertension ([place]); Morbid obesity ([place]); Obesity hypoventilation syndrome ([place]); On home oxygen therapy; Pickwickian syndrome ([place]); and Thoracic aortic aneurysm ([place]).    PAST SURGICAL HISTORY: Past Surgical History:  Procedure Laterality Date  . RIGHT OOPHORECTOMY    . SINUS ENDO WITH FUSION N/A 02/13/2017   Procedure: ENDOSCOPIC SINUS ENDO WITH FUSION;  Surgeon: Rozetta Nunnery, MD;  Location: [facility] OR;  Service: ENT;  Laterality: N/A;  . SINUSOTOMY Right 02/13/2017   Procedure: [place];  Surgeon: Rozetta Nunnery, MD;  Location: [place];  Service: ENT;  Laterality: Right;  . TONSILLECTOMY      FAMILY HISTORY: family history includes Diabetes in her other.  SOCIAL HISTORY:  reports that she has quit smoking. She has never used smokeless tobacco. She reports that she drinks alcohol. She reports that she uses drugs about 2 times per week.  ALLERGIES: Penicillins  MEDICATIONS:  Current Outpatient Prescriptions  Medication Sig Dispense Refill  . acetaminophen  (TYLENOL 8 HOUR) 650 MG CR tablet Take 1 tablet (650 mg total) by mouth every 8 (eight) hours as needed for pain. 30 tablet 0  . ARIPiprazole (ABILIFY) 15 MG tablet Take 15 mg by mouth at bedtime.    . chlorhexidine (PERIDEX) 0.12 % solution 10 mL by Mouth route Two (2) times a day (8am and 8pm).    . clindamycin (CLEOCIN) 300 MG capsule Take by mouth.    . divalproex (DEPAKOTE) 500 MG DR tablet Take 500 mg by mouth at bedtime.    . furosemide (LASIX) 40 MG tablet Take 2 tablets (80 mg total) by mouth 2 (two) times daily. 120 tablet 2  . metFORMIN (GLUCOPHAGE) 500 MG tablet Take 500 mg by mouth 2 (two) times daily with a meal.    . potassium chloride SA (K-DUR,KLOR-CON) 20 MEQ tablet Take 1 tablet (20 mEq total) by mouth 2 (two) times daily. 30 tablet 1  . HYDROcodone-acetaminophen (NORCO/VICODIN) 5-325 MG tablet Take 2 tablets by mouth every 4 (four) hours as needed. (Patient not taking: Reported on 06/29/2017) 8 tablet 0  . magnesium hydroxide (MILK OF MAGNESIA) 400 MG/5ML suspension Take 15 mLs by mouth daily as needed for mild constipation.    . ondansetron (ZOFRAN) 4 MG tablet Take 1 tablet (4 mg total) by mouth every 8 (eight) hours as needed for nausea or vomiting. (Patient not taking: Reported on 06/29/2017) 12 tablet 0   No current facility-administered medications for this encounter.     REVIEW OF SYSTEMS:  Notable for that above.   PHYSICAL EXAM:  height is 5\' 4"  (1.626 m) and weight is 301 lb 9.6 oz (136.8 kg) (abnormal). Her temperature is 98 F (36.7 C). Her blood pressure is 93/64. Her oxygen saturation is 100%.   General: Alert and oriented, in no acute distress HEENT: Oropharynx is notable for ~1cm long,  thin opening in the soft palate at reconstruction  site.  Post operative surgical swelling over right face. Neck: Neck is notable no adenopathy  Heart: Regular in rate and rhythm  Chest: Clear to auscultation bilaterally, with no rhonchi, wheezes, or rales.  Abdomen: LUQ  tenderness, not rigid, not guarding Extremities: No cyanosis or edema. Has trouble raising right arm due to graft site Lymphatics: see Neck Exam  Skin: No concerning lesions.Right lateral back scar from graft site Musculoskeletal: symmetric strength and muscle tone throughout.  Neurologic: Cranial nerves II through XII are grossly intact. No obvious focalities. Speech is fluent. Coordination is intact. Psychiatric: Judgment and insight are intact. Affect is appropriate.  ECOG = 3  0 - Asymptomatic (Fully active, able to carry on all predisease activities without restriction)  1 - Symptomatic but completely ambulatory (Restricted in physically strenuous activity but ambulatory and able to carry out work of a light or sedentary nature. For example, light housework, office work)  2 - Symptomatic, <50% in bed during the day (Ambulatory and capable of all self care but unable to carry out any work activities. Up and about more than 50% of waking hours)  3 - Symptomatic, >50% in bed, but not bedbound (Capable of only limited self-care, confined to bed or chair 50% or more of waking hours)  4 - Bedbound (Completely disabled. Cannot carry on any self-care. Totally confined to bed or chair)  5 - Death   Eustace Pen MM, Creech RH, Tormey DC, et al. [phone]). "Toxicity and response criteria of the [facility] Group". [place] Oncol. 5 (6): 649-55   LABORATORY DATA:  Lab Results  Component Value Date   WBC 14.8 (H) 05/06/2017   HGB 13.8 05/06/2017   HCT 41.8 05/06/2017   MCV 95.7 05/06/2017   PLT 162 05/06/2017   CMP     Component Value Date/Time   NA 137 05/06/2017 1559   NA 143 04/16/2014 1404   K 3.8 05/06/2017 1559   K 3.4 (L) 04/16/2014 1404   CL 97 (L) 05/06/2017 1559   CL 104 04/16/2014 1404   CO2 34 (H) 05/06/2017 1559   CO2 33 (H) 04/16/2014 1404   GLUCOSE 145 (H) 05/06/2017 1559   GLUCOSE 145 (H) 04/16/2014 1404   BUN 17 05/06/2017 1559   BUN 22 (H)  04/16/2014 1404   CREATININE 0.78 05/06/2017 1559   CREATININE 0.76 04/16/2014 1404   CALCIUM 8.7 (L) 05/06/2017 1559   CALCIUM 8.7 04/16/2014 1404   PROT 6.9 05/06/2017 1559   PROT 6.9 03/04/2014 0302   ALBUMIN 2.6 (L) 05/06/2017 1559   ALBUMIN 2.8 (L) 03/04/2014 0302   AST 14 (L) 05/06/2017 1559   AST 19 03/04/2014 0302   ALT 22 05/06/2017 1559   ALT 18 03/04/2014 0302   ALKPHOS 82 05/06/2017 1559   ALKPHOS 71 03/04/2014 0302   BILITOT 0.7 05/06/2017 1559   BILITOT 0.2 03/04/2014 0302   GFRNONAA >60 05/06/2017 1559   GFRNONAA >60 04/16/2014 1404   GFRAA >60 05/06/2017 1559   GFRAA >60 04/16/2014 1404        RADIOGRAPHY:  Obtaining outside images    IMPRESSION/PLAN:  This is a delightful patient with head and neck cancer. I do recommend 6-7 weeks of adjuvant radiotherapy for this patient. We included a physicist in the encounter today who believes we can use a slant board to accommodate her breathing issues.  We discussed the potential risks, benefits, and side effects of radiotherapy. We talked in detail about acute and late effects. We discussed  that some of the most bothersome acute effects may be mucositis, dysgeusia, salivary changes, skin or eye irritation, hair loss, dehydration, weight loss and fatigue. We talked about late effects which include but are not necessarily limited to dysphagia, hypothyroidism, nerve injury, eye injury,spinal cord injury, xerostomia, trismus, and neck edema. No guarantees of treatment were given. A consent form was signed and placed in the patient's medical record. The patient is enthusiastic about proceeding with treatment. I look forward to participating in the patient's care.    Simulation (treatment planning) will take place upon dental release.  We also discussed that the treatment of head and neck cancer is a multidisciplinary process to maximize treatment outcomes and quality of life. For this reasons the following referrals have been or  will be made:     Dentistry for dental evaluation, possible extractions in the radiation fields, and /or advice on reducing risk of cavities, osteoradionecrosis, or other oral issues.   Nutritionist for nutrition support during and after treatment.   Speech language pathology for swallowing and/or speech therapy.   Social work for social support.    Physical therapy due to risk of lymphedema in neck and deconditioning.   Opthalmology referral for potential eye irritation during RT   Baseline labs including TSH.   Patient has followup with her surgeon very soon, who has been following the open site at her palate per notes in [place].  I spent at least 45 minutes face to face with patient, over 50% of that time on counseling and care coordination. __________________________________________   Eppie Gibson, MD

## 2017-06-29 NOTE — Progress Notes (Signed)
Oncology Nurse Navigator Documentation  Met with Ms. Goguen during initial consult with Dr. Isidore Moos.  She was accompanied by her dtr [name] and son Brenton Grills.   1. Further introduced myself as her Navigator, explained my role as a member of the Care Team.   2. Provided New Patient Information packet, discussed contents:  Contact information for physician(s), myself, other members of the Care Team.  Advance Directive information ([place] blue pamphlet with LCSW contact info); provided [facility] AD forms.  Fall Prevention Patient Safety Plan  Appointment Guideline  Financial Assistance Information sheet  [place] with highlight of [place] 3. Provided introductory explanation of radiation treatment including SIM planning and purpose of Aquaplast head and shoulder mask, showed them example.   4. Discussed upcoming 10/23 H&N [facility], opportunity to meet with clinical team.  She agreed attendance, I provide her 0900 arrival to Radiation Waiting following lobby registration. 5. I encouraged them to contact me with questions/concerns as treatments/procedures begin, provided contact information to dtr and son.  They verbalized understanding of information provided.    [name], RN, BSN, [place] Neck Oncology Nurse [place] at [place] [phone]

## 2017-07-03 ENCOUNTER — Other Ambulatory Visit: Payer: Self-pay | Admitting: Radiation Oncology

## 2017-07-03 DIAGNOSIS — R634 Abnormal weight loss: Secondary | ICD-10-CM

## 2017-07-03 DIAGNOSIS — C310 Malignant neoplasm of maxillary sinus: Secondary | ICD-10-CM

## 2017-07-04 ENCOUNTER — Telehealth (HOSPITAL_COMMUNITY): Payer: Self-pay | Admitting: Dentistry

## 2017-07-04 ENCOUNTER — Other Ambulatory Visit (HOSPITAL_COMMUNITY): Payer: Medicare Other | Admitting: Dentistry

## 2017-07-04 NOTE — Telephone Encounter (Signed)
07/04/17  No show for Dental Consult appt. on 07/04/17 w/ Dr. Enrique Sack.  Called and spoke w/pt's Daughter. Rescheduled Dental Consult for 07/11/17 @ 12:45.  LRI

## 2017-07-09 ENCOUNTER — Telehealth: Payer: Self-pay | Admitting: *Deleted

## 2017-07-09 NOTE — Telephone Encounter (Signed)
Oncology Nurse Navigator Documentation  LVMM with patient's dtr re her attendance at tomorrow morning's H&N [place] with 0900 arrival.  Requested return call to confirm.  [name], RN, BSN, [place] Neck Oncology Nurse [place] at [place] [phone]

## 2017-07-09 NOTE — Telephone Encounter (Signed)
Oncology Nurse Navigator Documentation  Confirmed with patient dtr her mother's attendance at tomorrow morning's H&N [place], reminded her of 0900 arrival, explained registration process.  She voiced understanding.  [name], RN, BSN, [place] Neck Oncology Nurse [place] at [place] [phone]

## 2017-07-10 ENCOUNTER — Ambulatory Visit: Payer: Medicare Other

## 2017-07-10 ENCOUNTER — Telehealth: Payer: Self-pay | Admitting: *Deleted

## 2017-07-10 ENCOUNTER — Encounter: Payer: Self-pay | Admitting: *Deleted

## 2017-07-10 ENCOUNTER — Encounter: Payer: Medicare Other | Admitting: Nutrition

## 2017-07-10 ENCOUNTER — Ambulatory Visit: Payer: Medicare Other | Attending: Radiation Oncology

## 2017-07-10 ENCOUNTER — Ambulatory Visit: Admission: RE | Admit: 2017-07-10 | Payer: Medicare Other | Source: Ambulatory Visit

## 2017-07-10 ENCOUNTER — Ambulatory Visit: Payer: Medicare Other | Admitting: Physical Therapy

## 2017-07-10 NOTE — Telephone Encounter (Signed)
Oncology Nurse Navigator Documentation  LVMM on patient's, dtr's and son's phone requesting attendance confirmation for tomorrow's 1245 appt [place], requested call back.  [name], RN, BSN, [place] Neck Oncology Nurse [place] at [place] [phone]

## 2017-07-10 NOTE — Telephone Encounter (Signed)
Oncology Nurse Navigator Documentation  Called pt dtr, she confirmed her mother will keep 12:45 appt tomorrow with Dental Medicine. I confirmed her understanding of 11/29 11:00 appt with Dr. Irene Limbo.  [name], RN, BSN, [place] Neck Oncology Nurse [place] at [place] [phone]

## 2017-07-11 ENCOUNTER — Ambulatory Visit (HOSPITAL_COMMUNITY): Payer: Self-pay | Admitting: Dentistry

## 2017-07-11 ENCOUNTER — Encounter (HOSPITAL_COMMUNITY): Payer: Self-pay | Admitting: Dentistry

## 2017-07-11 VITALS — BP 101/62 | HR 73 | Temp 98.3°F

## 2017-07-11 DIAGNOSIS — K0530 Chronic periodontitis, unspecified: Secondary | ICD-10-CM

## 2017-07-11 DIAGNOSIS — K045 Chronic apical periodontitis: Secondary | ICD-10-CM

## 2017-07-11 DIAGNOSIS — C310 Malignant neoplasm of maxillary sinus: Secondary | ICD-10-CM

## 2017-07-11 DIAGNOSIS — K036 Deposits [accretions] on teeth: Secondary | ICD-10-CM

## 2017-07-11 DIAGNOSIS — K08409 Partial loss of teeth, unspecified cause, unspecified class: Secondary | ICD-10-CM

## 2017-07-11 DIAGNOSIS — M264 Malocclusion, unspecified: Secondary | ICD-10-CM

## 2017-07-11 DIAGNOSIS — K029 Dental caries, unspecified: Secondary | ICD-10-CM

## 2017-07-11 DIAGNOSIS — K083 Retained dental root: Secondary | ICD-10-CM

## 2017-07-11 DIAGNOSIS — Z01818 Encounter for other preprocedural examination: Secondary | ICD-10-CM

## 2017-07-11 DIAGNOSIS — K08199 Complete loss of teeth due to other specified cause, unspecified class: Secondary | ICD-10-CM

## 2017-07-11 DIAGNOSIS — K0889 Other specified disorders of teeth and supporting structures: Secondary | ICD-10-CM

## 2017-07-11 DIAGNOSIS — K06010 Localized gingival recession, unspecified: Secondary | ICD-10-CM

## 2017-07-11 MED ORDER — SODIUM FLUORIDE 1.1 % DT CREA: TOPICAL_CREAM | DENTAL | 99 refills | Status: DC

## 2017-07-11 NOTE — Progress Notes (Signed)
DENTAL CONSULTATION  Date of Consultation:  07/11/2017 Patient Name:   Jodi [name] Date of Birth:   1961/03/21 Medical Record Number: [id number]  VITALS: BP 101/62 (BP Location: Right Arm)   Pulse 73   Temp 98.3 F (36.8 C) (Oral)   CHIEF COMPLAINT: Patient referred by Dr. Isidore Moos for a dental consultation.   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 Patient is status post surgical resection and reconstructive surgery on 06/04/2017 at [facility]. Patient with anticipated radiation therapy and possible chemotherapy. Patient is now seen as part of a pre-chemoradiation therapy dental protocol examination.  Patient currently denies acute toothaches, swellings, or abscesses. Patient has not seen a dentist for "a long time." Patient does not go to the dentist unless she needs to have a tooth pulled. Patient denies having any complications from those previous dental extractions. Patient did have a consultation with Dr. Candis Schatz in [place], [place] for evaluation for an obturator. No obturator was fabricated however. Patient denies having partial dentures. Patient denies having dental phobia.  PROBLEM LIST: Patient Active Problem List   Diagnosis Date Noted  . 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    Priority: High  . Bipolar disorder, in partial remission, most recent episode manic ([place])   . Pulmonary hypertension ([place])   . Acute respiratory failure with hypoxia and hypercarbia (HCC)   . Morbid obesity due to excess calories ([place])   . Nocturnal hypoxemia due to obesity   . Acute on chronic diastolic (congestive) heart failure ([place]) 12/03/2015  . CHF (congestive heart failure) ([place]) 11/18/2015  . Non compliance with medical treatment 09/16/2015  . Bipolar disorder ([place]) 09/15/2015  . Type 2 diabetes mellitus ([place]) 09/15/2015  . Morbid obesity ([place]) 09/15/2015    PMH: Past Medical  History:  Diagnosis Date  . Anginal pain ([place])   . Atypical chest pain   . Bipolar disorder ([place])   . CHF (congestive heart failure) ([place])   . Chronic back pain   . Chronic knee pain   . Cor pulmonale (chronic) ([place])   . Depression   . Diabetes mellitus    Type II  . Dyspnea    with fluid retention; with activity  . Headache   . Hyperlipemia   . Malignant neoplasm of maxillary sinus ([place]) 02/13/2017  . Moderate to severe pulmonary hypertension ([place])   . Morbid obesity ([place])   . Obesity hypoventilation syndrome ([place])   . On home oxygen therapy    PRN  . Pickwickian syndrome ([place])   . Thoracic aortic aneurysm (HCC)     PSH: Past Surgical History:  Procedure Laterality Date  . RIGHT OOPHORECTOMY    . SINUS ENDO WITH FUSION N/A 02/13/2017   Procedure: ENDOSCOPIC SINUS ENDO WITH FUSION;  Surgeon: Rozetta Nunnery, MD;  Location: [facility] OR;  Service: ENT;  Laterality: N/A;  . SINUSOTOMY Right 02/13/2017   Procedure: [place];  Surgeon: Rozetta Nunnery, MD;  Location: [facility];  Service: ENT;  Laterality: Right;  . TONSILLECTOMY      ALLERGIES: Allergies  Allergen Reactions  . Penicillins Other (See Comments)    Has patient had a PCN reaction causing immediate rash, facial/tongue/throat swelling, SOB or lightheadedness with hypotension: Unknown Has patient had a PCN reaction causing severe rash involving mucus membranes or skin necrosis: Unknown Has patient had a PCN reaction that required hospitalization: No Has patient had a PCN reaction occurring  within the last 10 years: Unknown Patient states her MD informed her that she was allergic. If all of the above answers are "NO", then may proceed with [place]    MEDICATIONS: Current Outpatient Prescriptions  Medication Sig Dispense Refill  . acetaminophen (TYLENOL 8 HOUR) 650 MG CR tablet Take 1 tablet (650 mg total) by mouth every 8 (eight) hours as needed for pain. 30 tablet 0  . ARIPiprazole (ABILIFY)  15 MG tablet Take 15 mg by mouth at bedtime.    . divalproex (DEPAKOTE) 500 MG DR tablet Take 500 mg by mouth at bedtime.    . furosemide (LASIX) 40 MG tablet Take 2 tablets (80 mg total) by mouth 2 (two) times daily. 120 tablet 2  . potassium chloride SA (K-DUR,KLOR-CON) 20 MEQ tablet Take 1 tablet (20 mEq total) by mouth 2 (two) times daily. 30 tablet 1  . chlorhexidine (PERIDEX) 0.12 % solution 10 mL by Mouth route Two (2) times a day (8am and 8pm).    [name] HYDROcodone-acetaminophen (NORCO/VICODIN) 5-325 MG tablet Take 2 tablets by mouth every 4 (four) hours as needed. (Patient not taking: Reported on 06/29/2017) 8 tablet 0  . magnesium hydroxide (MILK OF MAGNESIA) 400 MG/5ML suspension Take 15 mLs by mouth daily as needed for mild constipation.    . metFORMIN (GLUCOPHAGE) 500 MG tablet Take 500 mg by mouth 2 (two) times daily with a meal.    . ondansetron (ZOFRAN) 4 MG tablet Take 1 tablet (4 mg total) by mouth every 8 (eight) hours as needed for nausea or vomiting. (Patient not taking: Reported on 06/29/2017) 12 tablet 0   No current facility-administered medications for this visit.     LABS: Lab Results  Component Value Date   WBC 14.8 (H) 05/06/2017   HGB 13.8 05/06/2017   HCT 41.8 05/06/2017   MCV 95.7 05/06/2017   PLT 162 05/06/2017      Component Value Date/Time   NA 137 05/06/2017 1559   NA 143 04/16/2014 1404   K 3.8 05/06/2017 1559   K 3.4 (L) 04/16/2014 1404   CL 97 (L) 05/06/2017 1559   CL 104 04/16/2014 1404   CO2 34 (H) 05/06/2017 1559   CO2 33 (H) 04/16/2014 1404   GLUCOSE 145 (H) 05/06/2017 1559   GLUCOSE 145 (H) 04/16/2014 1404   BUN 17 05/06/2017 1559   BUN 22 (H) 04/16/2014 1404   CREATININE 0.78 05/06/2017 1559   CREATININE 0.76 04/16/2014 1404   CALCIUM 8.7 (L) 05/06/2017 1559   CALCIUM 8.7 04/16/2014 1404   GFRNONAA >60 05/06/2017 1559   GFRNONAA >60 04/16/2014 1404   GFRAA >60 05/06/2017 1559   GFRAA >60 04/16/2014 1404   No results found for: INR,  PROTIME No results found for: PTT  SOCIAL HISTORY: Social History   Social History  . Marital status: Single    Spouse name: N/A  . Number of children: 3  . Years of education: N/A   Occupational History  . Not on file.   Social History Main Topics  . Smoking status: Former Research scientist (life sciences)  . Smokeless tobacco: Never Used     Comment: greater than 14 years ago  . Alcohol use 0.0 oz/week     Comment: rarely  . Drug use: Yes    Frequency: 2.0 times per week  . Sexual activity: Not on file   Other Topics Concern  . Not on file   Social History Narrative  . No narrative on file    FAMILY HISTORY:  Family History  Problem Relation Age of Onset  . Diabetes Other     REVIEW OF SYSTEMS: Reviewed with the patient as per History of present illness. Psych: Patient denies having dental phobia.  DENTAL HISTORY: CHIEF COMPLAINT: Patient referred by Dr. Isidore Moos for a dental consultation.   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 Patient is status post surgical resection and reconstructive surgery on 06/04/2017 at [facility]. Patient with anticipated radiation therapy and possible chemotherapy. Patient is now seen as part of a pre-chemoradiation therapy dental protocol examination.  Patient currently denies acute toothaches, swellings, or abscesses. Patient has not seen a dentist for "a long time." Patient does not go to the dentist unless she needs to have a tooth pulled. Patient denies having any complications from those previous dental extractions. Patient did have a prosthodontic consultation with Dr. Candis Schatz in [place], [place] for evaluation for an obturator. No obturator was fabricated however. Patient denies having partial dentures. Patient denies having dental phobia.  DENTAL EXAMINATION: GENERAL: Patient is a well-developed, obese female in no acute distress. Patient presents in a wheelchair with  minimal mobility at this time. HEAD AND NECK: No obvious neck lymphadenopathy noted. Patient denies acute TMJ symptoms. Patient is only able to open 15 mm measured from incisal edge to incisal edge INTRAORAL EXAM: Normal saliva. The right maxilla is consistent with previous surgical resection and flap reconstructive surgery. DENTITION: The patient is missing tooth numbers 1 - 8, 13, 14, 18, and 30. Patient has retained root segments in the area tooth numbers 15, 16, and 31. PERIODONTAL: Patient has chronic periodontitis with plaque and calculus accumulations, selective areas of gingival recession, and mandibular anterior incipient tooth mobility. There is incipient to moderate bone loss noted. DENTAL CARIES/SUBOPTIMAL RESTORATIONS: Dental caries are noted as per dental charting form. ENDODONTIC: Patient currently denies acute pulpitis symptoms. Patient has periapical pathology and radiolucency associated with the apices of tooth numbers 15, 16, and 31. CROWN AND BRIDGE: There are no crown or bridge restorations. PROSTHODONTIC: The patient denies having partial dentures. OCCLUSION: Patient has a poor occlusal scheme secondary to multiple missing teeth, presence of retained root segments, status post surgical resection and flap reconstruction, and lack of replacement of missing teeth with dental prostheses  RADIOGRAPHIC INTERPRETATION: Orthopantogram was taken today. There are multiple missing teeth. The right maxilla and right coronoid process or consistent with previous surgical resection and reconstructive surgery including plates and screws. Multiple surgical clips are noted. There are multiple retained root segments in the area tooth numbers 15, 16, and 31. There are periapical radiolucencies at the apices of tooth numbers 15, 16, and 31. Dental caries are noted. There is incipient to moderate bone loss noted. Radiographic calculus is noted   ASSESSMENTS: 1. Cancer of the right maxillary sinus-  status post surgical resection and flap reconstruction surgery 2. Pre-chemoradiaiton therapy dental protocol 3. Chronic apical periodontitis 4. Retained root segments 5. Dental caries 6. Chronic periodontitis with bone loss 7. Plaque and calculus accumulations 8. Gingival recession 9. Incipient mandibular anterior tooth mobility 10. Multiple missing teeth 11. Decreased maximum interincisal opening of 15 mm 12. No history of partial dentures 13. Poor occlusal scheme and malocclusion 14. Multiple medical comorbidities   PLAN/RECOMMENDATIONS: 1. I discussed the risks, benefits, and complications of various treatment options with the patient in relationship to her medical and dental conditions, anticipated radiation therapy and possible chemotherapy, and risk for side effects to include  xerostomia, radiation caries, trismus, mucositis, taste changes, gum and jawbone changes, and risk for infection and osteoradionecrosis. We discussed various treatment options to include no treatment, multiple extractions with alveoloplasty, pre-prosthetic surgery as indicated, periodontal therapy, dental restorations, root canal therapy, crown and bridge therapy, implant therapy, and replacement of missing teeth as indicated. We also discussed the need for referral to an oral surgeon and prosthodontist as indicated due to her medical and dental complexity. The patient currently agrees to be referred to oral surgeon, Dr. Frederik Schmidt,  for a dental consultation on 07/23/2017 at 2 PM for second opinion on the ability to proceed with extraction of tooth numbers 15, 16, 17, 31, and 32 with alveoloplasty as needed. In the meantime, I will discuss the anticipated plan of care with Dr. Isidore Moos to determine if patient is able to delay radiation therapy until dental extractions have been performed and have had adequate healing from the extractions. The patient will also be evaluated for initial periodontal therapy in Dental  Medicine clinic as time and space permits.  2. Discussion of findings with medical team and coordination of future medical and dental care as needed.  I spent in excess of  150 minutes during the conduct of this consultation and >50% of this time involved direct face-to-face encounter for counseling and/or coordination of the patient's care.    Lenn Cal, DDS

## 2017-07-11 NOTE — Patient Instructions (Signed)

## 2017-07-12 ENCOUNTER — Telehealth: Payer: Self-pay | Admitting: *Deleted

## 2017-07-12 NOTE — Telephone Encounter (Signed)
Oncology Nurse Navigator Documentation  Called pt dtr to inform 10/29 appt with Dr. Irene Limbo has been rescheduled for 10/30 1:00.  She voiced understanding.  [name], RN, BSN, [place] Neck Oncology Nurse [place] at [place] [phone]

## 2017-07-12 NOTE — Telephone Encounter (Signed)
Oncology Nurse Navigator Documentation  Called patient dtr to inform appt with oral surgeon [place] rescheduled from 11/5 to 10/29 10:45, 10/29 11:00 appt with Dr. Irene Limbo will be rescheduled.  She voiced understanding.  [name], RN, BSN, [place] Neck Oncology Nurse [place] at [place] [phone]

## 2017-07-12 NOTE — Progress Notes (Signed)
Oncology Nurse Navigator Documentation  Patient No Show for [facility].   [name], RN, BSN, [place] Neck Oncology Nurse [place] at [place] [phone]

## 2017-07-16 ENCOUNTER — Ambulatory Visit: Payer: Medicare Other | Admitting: Hematology

## 2017-07-16 ENCOUNTER — Telehealth: Payer: Self-pay | Admitting: *Deleted

## 2017-07-16 NOTE — Telephone Encounter (Signed)
Oncology Nurse Navigator Documentation  Spoke with Jodi [name] in follow-up to her rescheduling appt with Dr. Benson Norway from today to tomorrow 3:15 (per his office), called to confirm new appt with Dr. Benson Norway, confirm 1:00 appt MedOnc Dr. Irene Limbo.  She stated:  Does not want to proceed with RT, indicating concerns with 6 weeks daily tmt, SEs, lying on treatment table.  R side of her face "not healed yet".  Does not want to keep appt with MedOnc tomorrow.  "I'm not suppose to get chemotherapy."   I explained his role for support as she undergoes radiation tmt. I encouraged her to keep tomorrow's appt with Dr. Benson Norway, indicated I would share her SE/tmt concerns with Dr. Isidore Moos.  She voiced agreement/understanding.  [name], RN, BSN, [place] Neck Oncology Nurse [place] at [place] [phone]

## 2017-07-17 ENCOUNTER — Ambulatory Visit: Payer: Medicare Other | Admitting: Hematology

## 2017-07-17 NOTE — Telephone Encounter (Signed)
Will coordinate f/u with you ASAP.

## 2017-07-18 ENCOUNTER — Telehealth (HOSPITAL_COMMUNITY): Payer: Self-pay | Admitting: Dentistry

## 2017-07-18 NOTE — Telephone Encounter (Signed)
07/18/2017  Patient:            Jodi [name] Date of Birth:  [birth year] MRN:                [id number]   I had a telephone conversation with Mindy at Dr. Raynelle Dick office. The patient was seen for a oral surgery consultation yesterday with an extensive discussion of the risks, benefits, complications of various treatment options. The patient then indicated that she was not going to proceed with radiation therapy and, since she was not in acute dental pain,  she was refusing all dental extractions at this time. Patient will contact Dr. Raynelle Dick office if acute problems arise with dental pain. This information will be forwarded to Dr. Isidore Moos and [name], the head and neck navigator.    Ruby Cola

## 2017-07-24 ENCOUNTER — Telehealth: Payer: Self-pay | Admitting: *Deleted

## 2017-07-24 NOTE — Telephone Encounter (Signed)
Oncology Nurse Navigator Documentation  Per Dr. Isidore Moos, called patient to see if willing to meet with Dr. Isidore Moos again to discuss alternative RT plan, LVMM asking for return call.  [name], RN, BSN, [place] Neck Oncology Nurse [place] at [place] [phone]

## 2017-07-25 ENCOUNTER — Telehealth: Payer: Self-pay | Admitting: *Deleted

## 2017-07-26 ENCOUNTER — Telehealth: Payer: Self-pay | Admitting: *Deleted

## 2017-07-26 NOTE — Telephone Encounter (Signed)
Oncology Nurse Navigator Documentation  Reached Ms. Radloff at home, noted following up re return visit to see Dr. Isidore Moos to discuss RT plan.  She stated unable to talk, will call me back.  She did not return call by day's end.  Will call again tomorrow.  [name], RN, BSN, [place] Neck Oncology Nurse [place] at [place] [phone]

## 2017-07-26 NOTE — Telephone Encounter (Signed)
Oncology Nurse Navigator Documentation  Spoke with patient's dtr who indicated her mother is willing to reconsider RT.  I provided her 11/12 0730 appt.  She voiced understanding.  [name], RN, BSN, [place] Neck Oncology Nurse [place] at [place] [phone]

## 2017-07-27 NOTE — Progress Notes (Signed)
error 

## 2017-07-30 ENCOUNTER — Ambulatory Visit
Admission: RE | Admit: 2017-07-30 | Discharge: 2017-07-30 | Disposition: A | Discharge status: Home / Self Care | Payer: Medicare Other | Source: Ambulatory Visit | Attending: Radiation Oncology | Admitting: Radiation Oncology

## 2017-07-30 ENCOUNTER — Telehealth: Payer: Self-pay

## 2017-07-30 ENCOUNTER — Ambulatory Visit: Payer: Medicare Other

## 2017-07-30 NOTE — Telephone Encounter (Signed)
Jodi [name] had not arrived for her 8:00 appointment. I attempted to call her daughter but was not able to contact her. I called and spoke to Jodi [name]. She informed me that she has decided to not receive radiation. I informed her that today's appointment was to discuss radiation with Dr. Isidore Moos, not receive radiation. She again repeated that she did not want to have radiation and was not interested in coming to see Dr. Isidore Moos for discussion. I will inform Dr. Isidore Moos and [name] RN, head and neck navigator.

## 2018-11-08 ENCOUNTER — Other Ambulatory Visit: Payer: Self-pay

## 2018-11-08 ENCOUNTER — Emergency Department (HOSPITAL_COMMUNITY): Payer: Medicare Other

## 2018-11-08 ENCOUNTER — Emergency Department (HOSPITAL_COMMUNITY)
Admission: EM | Admit: 2018-11-08 | Discharge: 2018-11-08 | Disposition: A | Discharge status: Home / Self Care | Payer: Medicare Other | Attending: Emergency Medicine | Admitting: Emergency Medicine

## 2018-11-08 ENCOUNTER — Encounter (HOSPITAL_COMMUNITY): Payer: Self-pay

## 2018-11-08 DIAGNOSIS — I5033 Acute on chronic diastolic (congestive) heart failure: Secondary | ICD-10-CM | POA: Insufficient documentation

## 2018-11-08 DIAGNOSIS — I110 Hypertensive heart disease with heart failure: Secondary | ICD-10-CM | POA: Insufficient documentation

## 2018-11-08 DIAGNOSIS — E119 Type 2 diabetes mellitus without complications: Secondary | ICD-10-CM | POA: Diagnosis not present

## 2018-11-08 DIAGNOSIS — J0111 Acute recurrent frontal sinusitis: Secondary | ICD-10-CM | POA: Diagnosis not present

## 2018-11-08 DIAGNOSIS — Z87891 Personal history of nicotine dependence: Secondary | ICD-10-CM | POA: Insufficient documentation

## 2018-11-08 DIAGNOSIS — R51 Headache: Secondary | ICD-10-CM | POA: Diagnosis present

## 2018-11-08 DIAGNOSIS — Z79899 Other long term (current) drug therapy: Secondary | ICD-10-CM | POA: Diagnosis not present

## 2018-11-08 DIAGNOSIS — J3489 Other specified disorders of nose and nasal sinuses: Secondary | ICD-10-CM

## 2018-11-08 LAB — BASIC METABOLIC PANEL
Anion gap: 7 (ref 5–15)
BUN: 18 mg/dL (ref 6–20)
CALCIUM: 8.9 mg/dL (ref 8.9–10.3)
CO2: 29 mmol/L (ref 22–32)
CREATININE: 0.82 mg/dL (ref 0.44–1.00)
Chloride: 104 mmol/L (ref 98–111)
GFR calc non Af Amer: >60 mL/min (ref >60)
Glucose, Bld: 121 mg/dL — ABNORMAL HIGH (ref 70–99)
Potassium: 3.7 mmol/L (ref 3.5–5.1)
SODIUM: 140 mmol/L (ref 135–145)

## 2018-11-08 LAB — BRAIN NATRIURETIC PEPTIDE: B NATRIURETIC PEPTIDE 5: 38.3 pg/mL (ref 0.0–100.0)

## 2018-11-08 LAB — CBC
HCT: 41.7 % (ref 36.0–46.0)
Hemoglobin: 12.4 g/dL (ref 12.0–15.0)
MCH: 29.1 pg (ref 26.0–34.0)
MCHC: 29.7 g/dL — AB (ref 30.0–36.0)
MCV: 97.9 fL (ref 80.0–100.0)
Platelets: 161 10*3/uL (ref 150–400)
RBC: 4.26 MIL/uL (ref 3.87–5.11)
RDW: 13.8 % (ref 11.5–15.5)
WBC: 9 10*3/uL (ref 4.0–10.5)
nRBC: 0 % (ref 0.0–0.2)

## 2018-11-08 LAB — TROPONIN I

## 2018-11-08 MED ORDER — IOHEXOL 300 MG/ML  SOLN
75.0000 mL | Freq: Once | INTRAMUSCULAR | Status: AC | PRN
  Administered 2018-11-08: 75 mL via INTRAVENOUS

## 2018-11-08 MED ORDER — MOXIFLOXACIN HCL 400 MG PO TABS: 400.0000 mg | ORAL_TABLET | Freq: Every day | ORAL | 0 refills | Status: AC

## 2018-11-08 MED ORDER — HYDROCODONE-ACETAMINOPHEN 5-325 MG PO TABS
1.0000 | ORAL_TABLET | Freq: Four times a day (QID) | ORAL | 0 refills | Status: DC | PRN
Start: 2018-11-08 — End: 2019-01-14

## 2018-11-08 NOTE — ED Provider Notes (Signed)
[facility] DEPARTMENT Provider Note   CSN: [id number] Arrival date & time: 11/08/18  1748    History   Chief Complaint Chief Complaint  Patient presents with  . Shortness of Breath  . Sore Throat  . Headache    HPI Jodi [name] is a 58 y.o. female.     HPI   58 yo F with PMHx CHF, BPAD, known nasal cancer here w/ headaches, sinus pressure, cough/SOB. Pt reports that over the last 1 week, she's had progressively worsenign aching, throbbing, frontal headache. She's had sinus pressure, fullness with it. She has also had a mild cough and began producing some occasional blood-tinged sputum over the past few days. No ongoing SOB. She was seen 2 weeks ago and Rx'ed Doxycycline and Tamiflu for her sx, which mildly improved but her HA has persisted. No new vision changes. She has a known invasive nasopharyngeal carcinoma and is scheduled to undergo surgery at [facility] soon. No other complaints.  Past Medical History:  Diagnosis Date  . Anginal pain ([place])   . Atypical chest pain   . Bipolar disorder ([place])   . CHF (congestive heart failure) ([place])   . Chronic back pain   . Chronic knee pain   . Cor pulmonale (chronic) ([place])   . Depression   . Diabetes mellitus    Type II  . Dyspnea    with fluid retention; with activity  . Headache   . Hyperlipemia   . 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 Moderate to severe pulmonary hypertension ([place])   . Morbid obesity ([place])   . Obesity hypoventilation syndrome ([place])   . On home oxygen therapy    PRN  . Pickwickian syndrome ([place])   . Thoracic aortic aneurysm [facility])     Patient Active Problem List   Diagnosis Date Noted  . 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 Bipolar disorder, in partial remission, most recent episode manic ([place])   . Pulmonary hypertension ([place])   . Acute respiratory failure with hypoxia and hypercarbia (HCC)   . Morbid obesity due to excess calories  ([place])   . Nocturnal hypoxemia due to obesity   . Acute on chronic diastolic (congestive) heart failure ([place]) 12/03/2015  . CHF (congestive heart failure) ([place]) 11/18/2015  . Non compliance with medical treatment 09/16/2015  . Bipolar disorder ([place]) 09/15/2015  . Type 2 diabetes mellitus ([place]) 09/15/2015  . Morbid obesity ([place]) 09/15/2015    Past Surgical History:  Procedure Laterality Date  . RIGHT OOPHORECTOMY    . SINUS ENDO WITH FUSION N/A 02/13/2017   Procedure: ENDOSCOPIC SINUS ENDO WITH FUSION;  Surgeon: Rozetta Nunnery, MD;  Location: [facility] OR;  Service: ENT;  Laterality: N/A;  . SINUSOTOMY Right 02/13/2017   Procedure: [place];  Surgeon: Rozetta Nunnery, MD;  Location: [facility];  Service: ENT;  Laterality: Right;  . TONSILLECTOMY       OB History   No obstetric history on file.      Home Medications    Prior to Admission medications   Medication Sig Start Date End Date Taking? Authorizing Provider  ARIPiprazole (ABILIFY) 15 MG tablet Take 15 mg by mouth at bedtime.   Yes [provider]  divalproex (DEPAKOTE) 500 MG DR tablet Take 500 mg by mouth at bedtime.   Yes [provider]  furosemide (LASIX) 40 MG tablet Take 2 tablets (80 mg total) by mouth 2 (two) times  daily. 09/16/15  Yes Rama, Venetia Maxon, MD  metFORMIN (GLUCOPHAGE) 500 MG tablet Take 500 mg by mouth 2 (two) times daily with a meal.   Yes [provider]  potassium chloride SA (K-DUR,KLOR-CON) 20 MEQ tablet Take 1 tablet (20 mEq total) by mouth 2 (two) times daily. 11/19/15  Yes Adrian Prows, MD  acetaminophen (TYLENOL 8 HOUR) 650 MG CR tablet Take 1 tablet (650 mg total) by mouth every 8 (eight) hours as needed for pain. Patient not taking: Reported on 11/08/2018 05/03/17   Caccavale, Sophia, PA-C  HYDROcodone-acetaminophen (NORCO/VICODIN) 5-325 MG tablet Take 1-2 tablets by mouth every 6 (six) hours as needed for moderate pain or severe pain. 11/08/18   Duffy Bruce, MD  moxifloxacin (AVELOX) 400 MG tablet Take 1 tablet (400 mg total) by mouth daily at 8 pm for 10 days. 11/08/18 11/18/18  Duffy Bruce, MD  ondansetron (ZOFRAN) 4 MG tablet Take 1 tablet (4 mg total) by mouth every 8 (eight) hours as needed for nausea or vomiting. Patient not taking: Reported on 06/29/2017 05/03/17   Caccavale, Sophia, PA-C  sodium fluoride (PREVIDENT 5000 PLUS) 1.1 % CREA dental cream Apply to tooth brush. Brush teeth for 2 minutes. Spit out excess-DO NOT swallow. Repeat nightly. Patient not taking: Reported on 11/08/2018 07/11/17   Lenn Cal, DDS    Family History Family History  Problem Relation Age of Onset  . Diabetes Other     Social History Social History   Tobacco Use  . Smoking status: Former Research scientist (life sciences)  . Smokeless tobacco: Never Used  . Tobacco comment: greater than 14 years ago  Substance Use Topics  . Alcohol use: Yes    Alcohol/week: 0.0 standard drinks    Comment: rarely  . Drug use: Yes    Frequency: 2.0 times per week     Allergies   Penicillins   Review of Systems Review of Systems  Constitutional: Positive for fatigue. Negative for chills and fever.  HENT: Negative for congestion and rhinorrhea.   Eyes: Negative for visual disturbance.  Respiratory: Positive for cough. Negative for shortness of breath and wheezing.   Cardiovascular: Negative for chest pain and leg swelling.  Gastrointestinal: Negative for abdominal pain, diarrhea, nausea and vomiting.  Genitourinary: Negative for dysuria and flank pain.  Musculoskeletal: Negative for neck pain and neck stiffness.  Skin: Negative for rash and wound.  Allergic/Immunologic: Negative for immunocompromised state.  Neurological: Positive for weakness and headaches. Negative for syncope.  All other systems reviewed and are negative.    Physical Exam Updated Vital Signs BP 136/80 (BP Location: Right Arm)   Pulse 65   Temp 98.3 F (36.8 C) (Oral)   Resp 18   SpO2 97%     Physical Exam Vitals signs and nursing note reviewed.  Constitutional:      General: She is not in acute distress.    Appearance: She is well-developed.  HENT:     Head: Normocephalic and atraumatic.     Comments: Midface deformity s/p prior surgeries, with mild paranasal and maxillary TTP b/l. EOMI with no entrapment. TMs normal bilaterally. No facial erythema or wounds. No drainage. Eyes:     Conjunctiva/sclera: Conjunctivae normal.  Neck:     Musculoskeletal: Neck supple.  Cardiovascular:     Rate and Rhythm: Normal rate and regular rhythm.     Heart sounds: Normal heart sounds. No murmur. No friction rub.  Pulmonary:     Effort: Pulmonary effort is normal. No respiratory distress.  Breath sounds: Normal breath sounds. No wheezing or rales.  Abdominal:     General: There is no distension.     Palpations: Abdomen is soft.     Tenderness: There is no abdominal tenderness.  Skin:    General: Skin is warm.     Capillary Refill: Capillary refill takes less than 2 seconds.  Neurological:     Mental Status: She is alert and oriented to person, place, and time.     Motor: No abnormal muscle tone.      ED Treatments / Results  Labs (all labs ordered are listed, but only abnormal results are displayed) Labs Reviewed  CBC - Abnormal; Notable for the following components:      Result Value   MCHC 29.7 (*)    All other components within normal limits  BASIC METABOLIC PANEL - Abnormal; Notable for the following components:   Glucose, Bld 121 (*)    All other components within normal limits  BRAIN NATRIURETIC PEPTIDE  TROPONIN I    EKG EKG Interpretation  Date/Time:  Friday November 08 2018 18:15:08 EST Ventricular Rate:  66 PR Interval:  210 QRS Duration: 58 QT Interval:  382 QTC Calculation: 400 R Axis:   108 Text Interpretation:  Sinus rhythm with 1st degree A-V block with occasional Premature ventricular complexes Low voltage QRS Septal infarct , age  undetermined Non-specific ST-t changes No significant change since last tracing Confirmed by Duffy Bruce [phone]) on 11/09/2018 2:02:16 AM   Radiology Dg Chest 2 View  Result Date: 11/08/2018 CLINICAL DATA:  Initial evaluation for acute shortness of breath. EXAM: CHEST - 2 VIEW COMPARISON:  Prior radiograph from 12/03/2015 FINDINGS: Cardiomegaly, stable. Mediastinal silhouette within normal limits. Aortic atherosclerosis. Lungs normally inflated. Perihilar vascular congestion without overt pulmonary edema. No pleural effusion. No focal infiltrates. No pneumothorax. No acute osseous finding. IMPRESSION: 1. Cardiomegaly with associated mild perihilar vascular congestion without overt pulmonary edema. 2. Aortic atherosclerosis. Electronically Signed   By: Jeannine Boga M.D.   On: 11/08/2018 19:17   Ct Maxillofacial W Contrast  Result Date: 11/08/2018 CLINICAL DATA:  58 y/o F; shortness of breath, sore throat, chills, headache. 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 EXAM: CT MAXILLOFACIAL WITH CONTRAST TECHNIQUE: Multidetector CT imaging of the maxillofacial structures was performed with intravenous contrast. Multiplanar CT image reconstructions were also generated. CONTRAST:  63mL OMNIPAQUE IOHEXOL 300 MG/ML  SOLN COMPARISON:  01/09/2017 maxillofacial CT. FINDINGS: Osseous: Poorly marginated infiltrative soft tissue mass extending throughout the residual maxillary alveolar bone (series 5, image 35 and 43), walls of the left maxillary sinus (series 5, image 55) inclusive of floor of orbit (series 7, image 30), left pterygoid plates and muscles (series 5, image 47 and 48), and the the left-sided nasal bones (series 5, image 61). Infiltrated bone demonstrates a permeative eroded appearance. Additionally, there is masslike soft tissue within the flap at the site of right maxillary sinusotomy (series 7, image 36). Orbits: Postsurgical reconstruction of floor of right  orbit. Linear nonenhancing soft tissue in the right inferior orbit extraconal space probably represents postsurgical scarring. Inferior rectus muscle is surrounded by the soft tissue which may affect muscle function. Invasive mass extending through the left floor of orbit into the left orbit inferior extraconal space. A fat plane is preserved between the inferior rectus muscle and the infiltrative soft tissue. No intraconal extension of the mass. Sinuses: Opacification of the right frontal sinus with high attenuation inspissated secretions. Fluid level within the residual  right sphenoid cavity. Soft tissues: Postsurgical clips are present in the right face along the mandible. No lymphadenopathy within the field of view by imaging criteria. Limited intracranial: No significant or unexpected finding. IMPRESSION: 1. Poorly marginated infiltrative mass compatible with progressive squamous cell carcinoma. The mass extends throughout the residual maxillary alveolar bone, walls of the left maxillary sinus, left pterygoid plates and muscles, and the left-sided nasal bones. Mass extends through the left floor of orbit into the inferior left orbit extraconal space. 2. Concurrent sinus disease with opacification of the right frontal sinus and fluid level within the right sphenoid sinus which may represent acute sinusitis. Electronically Signed   By: Kristine Garbe M.D.   On: 11/08/2018 22:58    Procedures Procedures (including critical care time)  Medications Ordered in ED Medications  iohexol (OMNIPAQUE) 300 MG/ML solution 75 mL (75 mLs Intravenous Contrast Given 11/08/18 2233)     Initial Impression / Assessment and Plan / ED Course  I have reviewed the triage vital signs and the nursing notes.  Pertinent labs & imaging results that were available during my care of the patient were reviewed by me and considered in my medical decision making (see chart for details).        58 yo F with h/o  nasopharyngeal CA here with headache, sinus pressure, cough. Regarding her HA, suspect this is 2/2 acute on chronic sinusitis. No evidence of facial abscess. CT scan obtained, and extent of NP CA is similar to CT per review of records earlier this month - has [facility] f/u. Pt does have acute sinusitis, no sigsn of abscess or extension to meningitis, encephalitis. Will start empiric ABX for this. Otherwise, re: cough - suspect bronchitis. CXR without significant edema and labs o/w reassuring. BNP below baseline, does not appear hypervolemic on exam. I suspect her mild blood-streaked urine is 2/2 bronchitis and bronchial irritation, also possibly sinus drainage. Will tx as above, refer to her [facility] ENT. EKG non-ischemic, trop neg, doubt ACS.  Final Clinical Impressions(s) / ED Diagnoses   Final diagnoses:  Nasal cavity mass  Acute recurrent frontal sinusitis    ED Discharge Orders         Ordered    HYDROcodone-acetaminophen (NORCO/VICODIN) 5-325 MG tablet  Every 6 hours PRN     11/08/18 2318    moxifloxacin (AVELOX) 400 MG tablet  Daily     11/08/18 2318           Duffy Bruce, MD 11/09/18 [phone]

## 2018-11-08 NOTE — ED Notes (Signed)
Pt left with family in good condition. Reviewed AVS using teachback method. Pt verbalized understanding of DC instructions. No s/sx distress noted

## 2018-11-08 NOTE — ED Triage Notes (Signed)
Pt reports shortness of breath, sore throat, chills and headache since yesterday. Denies cough/congestion or n.v/ Pt a.o, nad, resp e.u

## 2018-11-08 NOTE — Discharge Instructions (Addendum)
You can take over-the-counter Alleve as needed for mild headache. I would touch base with your ENT to discuss if this is safe before any upcoming procedures, though.  I've prescribed a stronger medicine to take as needed for headache as well. Keep in mind this has tylenol in it so do not take it as well as tylenol.

## 2018-12-17 ENCOUNTER — Telehealth: Payer: Self-pay | Admitting: Hematology

## 2018-12-17 ENCOUNTER — Telehealth: Payer: Self-pay | Admitting: *Deleted

## 2018-12-17 NOTE — Telephone Encounter (Signed)
Confirmed appt with Dr. Maylon Peppers on 4/8 at 1pm w/the patient's daughter.

## 2018-12-17 NOTE — Telephone Encounter (Signed)
Oncology Nurse Navigator Documentation  Spoke with Willeen Cass, [facility], confirmed receipt of her call last Friday re: pt's wish to receive Rad/MedOnc tmt at [facility], provided [place] Referral fax for information [place] with pt's dtr to confirm referral receipt, informed her appt with Drs. Isidore Moos and Maylon Peppers to be scheduled next available.  Dtr agreed to bring her mother to [facility] for initial consult with Dr. Maylon Peppers with the understanding future appts/tmt will be at [facility].  MedOnc and RadOnc new referral coordinators informed.  [name], RN, BSN Head & Neck Oncology Nurse [place] at [place] [phone]

## 2018-12-19 ENCOUNTER — Other Ambulatory Visit: Payer: Self-pay | Admitting: *Deleted

## 2018-12-19 ENCOUNTER — Ambulatory Visit
Admission: RE | Admit: 2018-12-19 | Discharge: 2018-12-19 | Disposition: A | Discharge status: Home / Self Care | Payer: Self-pay | Source: Ambulatory Visit | Attending: Radiation Oncology | Admitting: Radiation Oncology

## 2018-12-19 ENCOUNTER — Telehealth: Payer: Self-pay | Admitting: Radiation Oncology

## 2018-12-19 ENCOUNTER — Other Ambulatory Visit: Payer: Self-pay | Admitting: Radiation Oncology

## 2018-12-19 DIAGNOSIS — C310 Malignant neoplasm of maxillary sinus: Secondary | ICD-10-CM

## 2018-12-19 NOTE — Telephone Encounter (Signed)
New message:     Lft vcmail for patient to return call to schedule from referral received

## 2018-12-20 ENCOUNTER — Other Ambulatory Visit: Payer: Self-pay | Admitting: *Deleted

## 2018-12-20 DIAGNOSIS — C310 Malignant neoplasm of maxillary sinus: Secondary | ICD-10-CM

## 2018-12-24 ENCOUNTER — Telehealth: Payer: Self-pay | Admitting: Hematology

## 2018-12-24 ENCOUNTER — Telehealth: Payer: Self-pay | Admitting: *Deleted

## 2018-12-24 NOTE — Telephone Encounter (Signed)
Pt cld to cancel appt with Dr. Maylon Peppers. I offered to reschedule, but she declined at this time.

## 2018-12-24 NOTE — Telephone Encounter (Signed)
Oncology Nurse Navigator Documentation  Returned VMM from pt daughter re tomorrow's appt with Dr. Maylon Peppers.  I explained the importance of appt with Dr. Maylon Peppers to discuss the roll of chemotherapy per referral from [facility].    She asked about accomodation for her calling in during appt since she cannot accompany her mother as its important she understand discussion.  I suggested her mother call her when arrived to the exam room to speak with Dr. Maylon Peppers, indicated I would notify Dr. Maylon Peppers and his nurse of request.  I discussed with her the importance of staging PET scan, provided description of procedure.  Provided [facility] Scheduling phone #, encouraged her to schedule appt.  I further explained appt with Dr. Isidore Moos can be arranged s/p PET. Dtr voiced understanding of information provided, I encouraged her to call me with additional questions/concerns.

## 2018-12-24 NOTE — Telephone Encounter (Signed)
Received call from [facility], radiology scheduler re:  Pt refused to have PET scan done on 12/27/18 when contacted.  Pt did not give reason for the refusal per [place].

## 2018-12-24 NOTE — Telephone Encounter (Signed)
Thank you for letting me know. If she comes for her appt, I will discuss it with her again.   Dr. Maylon Peppers

## 2018-12-25 ENCOUNTER — Inpatient Hospital Stay: Payer: Medicare Other | Admitting: Hematology

## 2018-12-31 ENCOUNTER — Telehealth: Payer: Self-pay | Admitting: *Deleted

## 2018-12-31 NOTE — Telephone Encounter (Signed)
Oncology Nurse Navigator Documentation  Spoke with [name] re scheduling of appt with Dr. Isidore Moos to discuss RT per referral from Dr. Barton Dubois, [facility].    She voiced agreement to schedule and asked if telephone consult feasible as she would like to have both her dtr and son available for the conversation. She indicated she can coordinate with her family.   I noted I would call her back with further guidance. I provided her my contact information, encouraged her to call me as she moves forward with appts.  Jodi Orem, RN, BSN Head & Neck Oncology Nurse [place] at [place] [phone]

## 2019-01-01 ENCOUNTER — Telehealth: Payer: Self-pay | Admitting: Radiation Oncology

## 2019-01-01 NOTE — Telephone Encounter (Signed)
New message:    Cld pt's daughter to set up appt from referral received. LVM for her to call back.

## 2019-01-07 ENCOUNTER — Telehealth: Payer: Self-pay | Admitting: *Deleted

## 2019-01-07 NOTE — Telephone Encounter (Signed)
Oncology Nurse Navigator Documentation  Called Ms. Henken' dtr to coordinate appt for her mother, LVMM requesting call back.  Spoke with Ms. Vachon, explained RadOnc Scheduler has LVMM on several occasions to schedule appt with her dtr but calls not returned.  She reiterated last week's expressed interest in having remote consult with Dr. Isidore Moos, I indicated I will have Scheduler call her to set up appt.  She agreed to plan, indicated she will contact her dtr and son to inform them of appt date/time so they can be present.  [place] notified.  [name], RN, BSN Head & Neck Oncology Nurse [place] at [place] [phone]

## 2019-01-10 NOTE — Progress Notes (Signed)
Head and Neck Cancer Location of Tumor / Histology:  11/21/18 [facility]   Patient presented with symptoms of: She presented in April/May of 2018 with a history of nose bleeds and nasal congestion dating back to 09/2016 and 03/2016 per her report. She was referred to [facility] for treatment. She underwent surgery on 06/06/19 at [facility]. She did see Dr. Isidore Moos on 06/29/17 and radiation was recommended, but patient declined treatment. She followed at [facility] and a CT neck/chest was completed on 10/30/18 which demonstrated a new left sided maxillary mass. She had surgery on 11/21/18 with biopsy at [facility]   Biopsies of left maxillary wall, left maxillary sinus, left middle turbinate revealed: invasive moderately differentiated squamous cell carcinoma.   Nutrition Status Yes No Comments  Weight changes? []  [x]    Swallowing concerns? []  [x]    PEG? []  [x]     Referrals Yes No Comments  Social Work? []  [x]    Dentistry? []  [x]    Swallowing therapy? []  [x]    Nutrition? []  [x]    Med/Onc? []  [x]     Safety Issues Yes No Comments  Prior radiation? []  [x]    Pacemaker/ICD? []  [x]    Possible current pregnancy? []  [x]    Is the patient on methotrexate? []  [x]     Tobacco/Marijuana/Snuff/ETOH use: She is a former smoker quitting 14-15 years ago. She drinks alcohol rarely.   Past/Anticipated interventions by otolaryngology, if any:  11/21/18 NASAL/SINUS ENDOSCOPY, SURGICAL WITH MAXILLARY ANTROSTOMY; WITH REMOVAL OF TISSUE FROM MAXILLARY SINUS Left General  Clean Contaminated     NASAL/SINUS ENDO-OR; [facility] Left General  Clean Contaminated     STEREOTACTIC COMPUTER-ASSISTED (NAVIGATIONAL) PROCEDURE; CRANIAL, EXTRADURAL Left General  Clean Contaminated      Surgeon Surgeon Role Service Panel  Edyth Gunnels, MD Primary ENT 1  Charolett Bumpers, Myrtis Ser, MD        Past/Anticipated interventions by medical oncology, if any:  None. She has missed appointments with Dr. Maylon Peppers.    Current  Complaints / other details:

## 2019-01-13 NOTE — Progress Notes (Signed)
Radiation Oncology         [phone] ________________________________  Telephone re-Consultation  Name: Jodi [name] MRN: [id number]  Date: 01/14/2019  DOB: [birth year]  EN:IDPO, Dola Factor, MD  Tyler Pita, MD   REFERRING PHYSICIAN: Tyler Pita, MD  DIAGNOSIS:    ICD-10-CM   1. Malignant neoplasm of maxillary sinus ([place]) C31.0     CHIEF COMPLAINT:   to discuss management of recurrent locally advanced maxillary sinus cancer  HISTORY OF PRESENT ILLNESS::Jodi [name] is a 58 y.o. 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 She was initially diagnosed in 01/2017 after progressive right nasal congestion. She was referred to [facility], but she was unable to complete the recommended studies. She was then seen by Dr. Charolett Bumpers at [facility] on 05/04/2017 and underwent resection on 06/05/2017. She was seen in our office in 06/2017 but refused treatment at that time. She has been followed on observation since that time.  She underwent follow up chest and neck CTs on 10/18/2018 at [facility]. Neck CT revealed: large destructive mass centered in the left maxillary sinus/hard palate, with complete destruction of the osseous portion of the scapular tip flap, complete destruction of the maxillary alveolar bone, diffuse destruction of the walls of the maxillary sinus and left pterygoid plate, including the roof of the left maxillary sinus, with mass extension along the margins of the extraconal left orbit; suspect involvement of the osseous skull base, particularly of the left sphenoid bone; left medial pterygopalatine fossa is also involved; ill-defined nature of this mass makes precise measurement and characterization of extent difficult; no lymphadenopathy. Chest CT showed no evidence of thoracic metastases.  She then proceeded to left nasal/sinus endoscopy on 11/21/2018. Pathology from this procedure revealed:  invasive moderately differentiated squamous cell carcinoma within the left posterior maxillary wall, left anterior maxillary mass, left maxillary sinus, and left middle turbinate. Extensive bone involvement was noted within the maxillary wall and sinus. She is scheduled to follow up with Dr. Charolett Bumpers on 01/17/2019.  Of note, she has currently declined to see Dr. Maylon Peppers and to undergo PET scan.  Head and Neck Cancer Location of Tumor / Histology:  11/21/18 [facility]     Nutrition Status Yes No Comments  Weight changes? []  [x]    Swallowing concerns? []  [x]    PEG? []  [x]     Referrals Yes No Comments  Social Work? []  [x]    Dentistry? []  [x]    Swallowing therapy? []  [x]    Nutrition? []  [x]    Med/Onc? []  [x]     Safety Issues Yes No Comments  Prior radiation? []  [x]    Pacemaker/ICD? []  [x]    Possible current pregnancy? []  [x]    Is the patient on methotrexate? []  [x]     Tobacco/Marijuana/Snuff/ETOH use: She is a former smoker quitting 14-15 years ago. She drinks alcohol rarely.    Past/Anticipated interventions by medical oncology, if any:  None. She has missed appointments with Dr. Maylon Peppers.   ETOH abuse, if any: none  Prior cancers, if any: none  She is living with her sister in [place].  Never had RT or chemotherapy.  Needs head elevated when lying down, has claustrophobia.  PREVIOUS RADIATION THERAPY: No  PAST MEDICAL HISTORY:  has a past medical history of Anginal pain ([place]), Atypical chest pain, Bipolar disorder ([place]), CHF (congestive heart failure) ([place]), Chronic back pain, Chronic knee pain, Cor pulmonale (chronic) ([place]), Depression, Diabetes mellitus, Dyspnea, Headache,  Hyperlipemia, Malignant neoplasm of maxillary sinus ([facility]) (02/13/2017), Moderate to severe pulmonary hypertension ([place]), Morbid obesity ([place]), Obesity hypoventilation syndrome ([place]), On home oxygen therapy, Pickwickian syndrome ([place]), and Thoracic aortic aneurysm ([place]).    PAST SURGICAL HISTORY: Past Surgical  History:  Procedure Laterality Date   RIGHT OOPHORECTOMY     SINUS ENDO WITH FUSION N/A 02/13/2017   Procedure: ENDOSCOPIC SINUS ENDO WITH FUSION;  Surgeon: Rozetta Nunnery, MD;  Location: [facility] OR;  Service: ENT;  Laterality: N/A;   SINUSOTOMY Right 02/13/2017   Procedure: SINUSOTOMY VIA CALDWELL LUC;  Surgeon: Rozetta Nunnery, MD;  Location: [place];  Service: ENT;  Laterality: Right;   TONSILLECTOMY      FAMILY HISTORY: family history includes Diabetes in an other family member.  SOCIAL HISTORY:  reports that she quit smoking about 15 years ago. She has never used smokeless tobacco. She reports current alcohol use. She reports that she does not use drugs.  ALLERGIES: Penicillins  MEDICATIONS:  Current Outpatient Medications  Medication Sig Dispense Refill   ARIPiprazole (ABILIFY) 15 MG tablet Take 15 mg by mouth at bedtime.     divalproex (DEPAKOTE) 500 MG DR tablet Take 500 mg by mouth at bedtime.     furosemide (LASIX) 40 MG tablet Take 2 tablets (80 mg total) by mouth 2 (two) times daily. 120 tablet 2   metFORMIN (GLUCOPHAGE) 500 MG tablet Take 500 mg by mouth 2 (two) times daily with a meal.     potassium chloride SA (K-DUR,KLOR-CON) 20 MEQ tablet Take 1 tablet (20 mEq total) by mouth 2 (two) times daily. 30 tablet 1   acetaminophen (TYLENOL 8 HOUR) 650 MG CR tablet Take 1 tablet (650 mg total) by mouth every 8 (eight) hours as needed for pain. (Patient not taking: Reported on 11/08/2018) 30 tablet 0   ondansetron (ZOFRAN) 4 MG tablet Take 1 tablet (4 mg total) by mouth every 8 (eight) hours as needed for nausea or vomiting. (Patient not taking: Reported on 06/29/2017) 12 tablet 0   sodium fluoride (PREVIDENT 5000 PLUS) 1.1 % CREA dental cream Apply to tooth brush. Brush teeth for 2 minutes. Spit out excess-DO NOT swallow. Repeat nightly. (Patient not taking: Reported on 11/08/2018) 1 Tube prn   No current facility-administered medications for this encounter.      REVIEW OF SYSTEMS:  Notable for that above.   PHYSICAL EXAM:  NA   LABORATORY DATA:  Lab Results  Component Value Date   WBC 9.0 11/08/2018   HGB 12.4 11/08/2018   HCT 41.7 11/08/2018   MCV 97.9 11/08/2018   PLT 161 11/08/2018   CMP     Component Value Date/Time   NA 140 11/08/2018 1818   NA 143 04/16/2014 1404   K 3.7 11/08/2018 1818   K 3.4 (L) 04/16/2014 1404   CL 104 11/08/2018 1818   CL 104 04/16/2014 1404   CO2 29 11/08/2018 1818   CO2 33 (H) 04/16/2014 1404   GLUCOSE 121 (H) 11/08/2018 1818   GLUCOSE 145 (H) 04/16/2014 1404   BUN 18 11/08/2018 1818   BUN 22 (H) 04/16/2014 1404   CREATININE 0.82 11/08/2018 1818   CREATININE 0.76 04/16/2014 1404   CALCIUM 8.9 11/08/2018 1818   CALCIUM 8.7 04/16/2014 1404   PROT 6.9 05/06/2017 1559   PROT 6.9 03/04/2014 0302   ALBUMIN 2.6 (L) 05/06/2017 1559   ALBUMIN 2.8 (L) 03/04/2014 0302   AST 14 (L) 05/06/2017 1559   AST 19 03/04/2014 0302  ALT 22 05/06/2017 1559   ALT 18 03/04/2014 0302   ALKPHOS 82 05/06/2017 1559   ALKPHOS 71 03/04/2014 0302   BILITOT 0.7 05/06/2017 1559   BILITOT 0.2 03/04/2014 0302   GFRNONAA >60 11/08/2018 1818   GFRNONAA >60 04/16/2014 1404   GFRAA >60 11/08/2018 1818   GFRAA >60 04/16/2014 1404      Lab Results  Component Value Date   TSH 0.933 12/15/2013     RADIOGRAPHY: reviewed by me    IMPRESSION/PLAN: Due to COVID precautions, patient and her daughter elected to have this reconsultation by phone. They could not manipulate WEB EX. [name], RN, our Head and Neck Oncology Navigator was also present for the call.  This is a very nicel patient with head and neck cancer.  I explained that her condition is very serious and advanced. Even with surgery, chance of cure is slim.  ChRT alone would most likely not cure her but could stunt disease grow and shrink disease down, hopefully. RT alone would be less aggressive, but may slow down grow/shrink it and still delay morbidity from her  cancer.  RT most standardly is given over 7 weeks, but less aggressive courses can still provide palliation in as little as a week.   We discussed the implications of not undergoing recent staging scans, and of delaying treatment.  We discussed the potential risks, benefits, and side effects of radiotherapy.   We discussed the value of a palliative care consult.  We discussed value of hospice for patients not interested in aggressive measures.  All questions were answered to the best of my ability.  The patient is not sure what she wants to do. She is will however to talk to Dr Maylon Peppers if a phone consult is possible with her daughter. She is also amenable to a palliative care consult by phone.  [name], RN, our Head and Neck Oncology Navigator will navigate accordingly and update me as he receives more feedback on her decisions.  This encounter was provided by telemedicine - phone The patient has given verbal consent for this type of encounter and has been advised to only accept a meeting of this type in a secure network environment. The time spent during this encounter was at least 25 minutes. The attendants for this meeting include Eppie Gibson  and Silvestre Mesi.  During the encounter, Eppie Gibson was located at [facility] Radiation Oncology Department.  Page Arfa Lamarca was located at home.   __________________________________________   Eppie Gibson, MD   This document serves as a record of services personally performed by Eppie Gibson, MD. It was created on her behalf by Wilburn Mylar, a trained medical scribe. The creation of this record is based on the scribe's personal observations and the provider's statements to them. This document has been checked and approved by the attending provider.

## 2019-01-14 ENCOUNTER — Ambulatory Visit
Admission: RE | Admit: 2019-01-14 | Discharge: 2019-01-14 | Disposition: A | Discharge status: Home / Self Care | Payer: Medicare Other | Source: Ambulatory Visit | Attending: Radiation Oncology | Admitting: Radiation Oncology

## 2019-01-14 ENCOUNTER — Other Ambulatory Visit: Payer: Self-pay

## 2019-01-14 ENCOUNTER — Encounter: Payer: Self-pay | Admitting: *Deleted

## 2019-01-14 ENCOUNTER — Encounter: Payer: Self-pay | Admitting: Radiation Oncology

## 2019-01-14 DIAGNOSIS — C310 Malignant neoplasm of maxillary sinus: Secondary | ICD-10-CM

## 2019-01-15 ENCOUNTER — Encounter: Payer: Self-pay | Admitting: Radiation Oncology

## 2019-01-17 ENCOUNTER — Telehealth: Payer: Self-pay | Admitting: Hematology

## 2019-01-17 ENCOUNTER — Encounter: Payer: Self-pay | Admitting: *Deleted

## 2019-01-17 ENCOUNTER — Other Ambulatory Visit: Payer: Self-pay

## 2019-01-17 ENCOUNTER — Encounter: Payer: Self-pay | Admitting: Hematology

## 2019-01-17 ENCOUNTER — Inpatient Hospital Stay: Payer: Medicare Other | Attending: Hematology | Admitting: Hematology

## 2019-01-17 DIAGNOSIS — F319 Bipolar disorder, unspecified: Secondary | ICD-10-CM

## 2019-01-17 DIAGNOSIS — I2729 Other secondary pulmonary hypertension: Secondary | ICD-10-CM

## 2019-01-17 DIAGNOSIS — Z7189 Other specified counseling: Secondary | ICD-10-CM | POA: Diagnosis not present

## 2019-01-17 DIAGNOSIS — C310 Malignant neoplasm of maxillary sinus: Secondary | ICD-10-CM | POA: Diagnosis not present

## 2019-01-17 DIAGNOSIS — I5030 Unspecified diastolic (congestive) heart failure: Secondary | ICD-10-CM

## 2019-01-17 NOTE — Progress Notes (Signed)
Oncology Nurse Navigator Documentation  Joined Dr. Isidore Moos during conference call consult with Jodi [name] and her dtr Jodi [name]. Jodi [name]:  Expressed interest in considering RT and/or chemo for tmt of her L maxillary sinus ISCC.  Voiced understanding PET needed to assess current disease status.  Voiced understanding she will need appt with [facility] for fabrication of tongue positioner should she move forward with RT.  Indicated she prefers telephone consult with Dr. Maylon Peppers vs in-person visit d/t COVID-19 pandemic.  Accepted palliative care referral in order to best understand her options. Dtr indicated she needs to participate in provider conversations so she can help her mother understand information. They voiced understanding I will move forward with coordination of appts/calls with Dr. Maylon Peppers, Palliative Care and rescheduling of PET.  [name], RN, BSN Head & Neck Oncology Nurse [place] [facility] [place] [phone]

## 2019-01-17 NOTE — Progress Notes (Signed)
[facility]  HEMATOLOGY-ONCOLOGY TeleHEALTH VISIT PROGRESS NOTE   I connected with Jodi [name] on 01/17/19 at 11:00 AM EDT by telephone visit and verified that I am speaking with the correct person using two identifiers.   I discussed the limitations, risks, security and privacy concerns of performing an evaluation and management service by telemedicine and the availability of in-person appointments. I also discussed with the patient that there may be a patient responsible charge related to this service. The patient expressed understanding and agreed to proceed.   Other persons participating in the visit and their role in the encounter: patient, patient's daughter [name]), Wynona Meals (H&N navigator)  Patient's location: home   Provider's location: clinic  Patient Care Team: Bartholome Bill, MD as PCP - General (Family Medicine) Eppie Gibson, MD as Attending Physician (Radiation Oncology) Leota Sauers, RN as Oncology Nurse Navigator Karie Mainland, RD as Dietitian (Nutrition) Jomarie Longs, PT (Inactive) as Physical Therapist (Physical Therapy) Kennith Center, LCSW as Social Worker [place], [place], CCC-SLP as Speech Language Pathologist (Speech Pathology)  HEME/ONC OVERVIEW: 1. 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 Isidore Moos despite high risk for recurrence  (A) Recurrent disease in the left maxillary sinus, Stage IVB (cT4N0Mx) -10/2018: CT maxillofacial showed an infiltrative mass extending through the maxillary alveolar bone, walls of the left maxillary sinus, left pterygoid plates and muscles and left-sided nasal bones; extends through the left floor of orbit into the inferior left orbit  extraconal space  -11/2018: bx of the left maxillary mass showed invasively moderately differentiated squamous cell carcinoma involving the bone; patient declined surgical resection   TREATMENT REGIMEN:  06/05/2017: resection of 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 Severe pulmonary HTN with cor pulmonale  2. Hx of diastolic heart failure 3. Bipolar disorder  4. 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  Path showed Stage IVA (pT4aN0M0) squamous cell carcinoma with negative margins.  Due to the high risk of disease recurrence, adjuvant radiation was strongly recommended, but the patient ultimately declined adjuvant treatment.  She underwent CT neck and chest in early 10/2018, which showed a new, locally advanced left maxillary sinus mass, the biopsy of which showed invasive moderately differentiated squamous cell carcinoma.  CT maxillofacial in late 10/2018 showed an infiltrative mass extending through the maxillary alveolar bone, walls of the left maxillary sinus, left pterygoid plates and muscles and left-sided nasal bones, and extending through the left floor of the orbit -She met with ENT at [facility], and after discussions of some of the potential benefits and risks, patient declined surgical resection, and was referred to medical oncology and radiation oncology for discussion of chemotherapy and radiation, respectively -I reviewed  the imaging and pathology results in detail with the patient; I also discussed  the NCCN guideline with the patient in detail  -Given the very locally advanced disease, I have ordered PET scan to rule out any metastatic disease -If there is no evidence of metastatic disease, the standard-of-care approach in the absence of upfront surgery is definitive chemoradiation -I discussed in detail with the patient some of the benefits and potential side effects of chemotherapy with the patient  -Some of the short term side-effects included, though not limited to, risk of fatigue, weight loss, tumor lysis syndrome, risk of allergic reactions, pancytopenia, life-threatening infections, need for transfusions of blood products, nausea, vomiting, change in bowel habits, admission to hospital for various reasons, and risks of death -Long term side-effects are also discussed including permanent damage to nerve function, chronic fatigue, and rare secondary malignancy including bone marrow disorders  -In light of the patient's multiple comorbidities, including severe pulmonary hypertension with cor pulmonale, history of heart failure with preserved EF, and bipolar disorder, she would likely tolerate systemic therapy very poorly and is at high risk for potential serious adverse outcomes from chemotherapy; furthermore, any benefits from chemotherapy would likely be off set by the toxicities from the chemotherapy  -Therefore, I recommend the patient to strongly consider definitive radiation if PET scan is negative for metastatic disease; alternatively, she may re-consider surgery as an option, but it would likely be associated very significant comorbidities   Goals of care discussion -Patient and her daughter are somewhat uncertain about the goals of care -I discussed with the patient that before she and her daughter decide on surgery vs. radiation, it would be very importance to determine what their goals of care are.  -If  they are hoping to control the cancer with the goal to improve quality of life, then they may consider taking a less aggressive approach, such as radiation, to delay tumor progression and be associated with less toxicities, but it would also likely have lower chance of cure.  -However, if her goal is to prolong her life for as long as possible regardless of potential side effects, then upfront surgical resection, followed by radiation, would likely have higher chance of cure, but would also be likely associated with substantial side effects -In either case, given her comorbidities, chemotherapy would likely be associated with more harms than benefits, and therefore, I would not recommend chemotherapy at this time -Patient and her daughter expressed understanding, and would talk further before making a decision    No orders of the defined types were placed in this encounter.  I discussed the assessment and treatment plan with the patient. The patient was provided an opportunity to ask questions and all were answered. The patient agreed with the plan and demonstrated an understanding of the instructions.   The patient was advised to call back or seek an in-person evaluation if the symptoms worsen or if the condition fails to improve as anticipated.   I provided 60 minutes of non face-to-face telephone visit time during this encounter, and > 50% was spent counseling as documented under my assessment & plan.   Jodi Men, MD 01/17/2019 11:22 AM   CHIEF COMPLAINTS/PURPOSE OF CONSULTATION:  "I just want to find out what next"  HISTORY OF PRESENTING ILLNESS:  Jodi [name] is a 58 yo AAF with multiple comorbidities, including severe pulmonary hypertension with cor pulmonale, history of diastolic heart failure with preserved EF, and bipolar disorder, with whom I had virtual visit to discuss the role of chemotherapy for recurrent squamous cell carcinoma of the left maxillary sinus.  Patient reports that she has  had a few weeks of persistent right upper tooth discomfort, exacerbated by talking and eating, occasionally worse, but she denies any associated odynophagia or dysphasia.  Overall, the discomfort remains relatively unchanged.  She denies any other complaint, such as fever, night sweats, weight loss, chest pain, dyspnea, abdominal pain, nausea, vomiting, diarrhea, or abnormal bleeding/bruising.  She reports that she is able to do some chores around the house, such as laundry, but does spend most of the time sitting.  I have reviewed her chart and materials related to her cancer extensively and collaborated history with the patient. Summary of oncologic history is as follows:   Malignant neoplasm of maxillary sinus (HCC)   02/13/2017 Initial Diagnosis    Malignant neoplasm of maxillary sinus (HCC)    10/19/2018 Imaging    CT neck:  -- Large destructive mass centered in the left maxillary sinus/hard palate, with complete destruction of the osseous portion of the scapular tip flap, complete destruction of the maxillary alveolar bone, diffuse destruction of the walls of the maxillary sinus and left pterygoid plate, including the roof of the left maxillary sinus, with mass extension along the margins of the extraconal left orbit. Suspect involvement of the osseous skull base, particularly of the left sphenoid bone. The left medial pterygopalatine fossa is also involved. Ill-defined nature of this mass makes precise measurement and characterization of extent difficult, and further evaluation with skull base MRI is recommended.  -- No lymphadenopathy.  -- Sequelae of complex right facial reconstruction following complex right inverted papilloma resection.    10/19/2018 Imaging    CT chest: IMPRESSION:  -No evidence of thoracic metastases. -Stable cardiomegaly and dilated main pulmonary artery.    11/08/2018 Imaging    CT maxillofacial: IMPRESSION: 1. Poorly marginated infiltrative mass compatible with  progressive squamous cell carcinoma. The mass extends throughout the residual maxillary alveolar bone, walls of the left maxillary sinus, left pterygoid plates and muscles, and the left-sided nasal bones. Mass extends through the left floor of orbit into the inferior left orbit extraconal space. 2. Concurrent sinus disease with opacification of the right frontal sinus and fluid level within the right sphenoid sinus which may represent acute sinusitis.     MEDICAL HISTORY:  Past Medical History:  Diagnosis Date  . Anginal pain ([place])   . Atypical chest pain   . Bipolar disorder ([place])   . CHF (congestive heart failure) ([place])   . Chronic back pain   . Chronic knee pain   . Cor pulmonale (chronic) ([place])   . Depression   . Diabetes mellitus    Type II  . Dyspnea    with fluid retention; with activity  . Headache   . Hyperlipemia   . Malignant neoplasm of maxillary sinus ([place]) 02/13/2017  . Moderate to severe pulmonary hypertension ([place])   . Morbid obesity ([place])   . Obesity hypoventilation syndrome ([place])   . On home oxygen therapy    PRN  . Pickwickian syndrome ([place])   . Thoracic aortic aneurysm [facility])     SURGICAL HISTORY: Past Surgical History:  Procedure Laterality Date  . RIGHT OOPHORECTOMY    . SINUS ENDO WITH FUSION N/A 02/13/2017   Procedure: ENDOSCOPIC SINUS ENDO WITH FUSION;  Surgeon: Rozetta Nunnery, MD;  [facility];  Service: ENT;  Laterality: N/A;  . SINUSOTOMY Right 02/13/2017   Procedure: [place];  Surgeon: Rozetta Nunnery, MD;  Location: [facility];  Service: ENT;  Laterality: Right;  . TONSILLECTOMY      SOCIAL HISTORY: Social History   Socioeconomic History  . Marital status: Single    Spouse name: Not on file  . Number of children: 3  . Years of education: Not on file  . Highest education level: Not on file  Occupational History  . Not on file  Social Needs  . Financial resource strain: Not on file  . Food  insecurity:    Worry: Not on file    Inability: Not on file  . Transportation needs:    Medical: No    Non-medical: No  Tobacco Use  . Smoking status: Former Smoker    Last attempt to quit: 09/19/2003    Years since quitting: 15.3  . Smokeless tobacco: Never Used  . Tobacco comment: greater than 14 years ago  Substance and Sexual Activity  . Alcohol use: Yes    Alcohol/week: 0.0 standard drinks    Comment: rarely  . Drug use: Never  . Sexual activity: Not on file  Lifestyle  . Physical activity:    Days per week: Not on file    Minutes per session: Not on file  . Stress: Not on file  Relationships  . Social connections:    Talks on phone: Not on file    Gets together: Not on file    Attends religious service: Not on file    Active member of club or organization: Not on file    Attends meetings of clubs or organizations: Not on file    Relationship status: Not on file  . Intimate partner violence:    Fear of current or ex partner: Not on file    Emotionally abused: Not on file    Physically abused: Not on file    Forced sexual activity: Not on file  Other Topics Concern  . Not on file  Social History Narrative  . Not on file    FAMILY HISTORY: Family History  Problem Relation Age of Onset  . Diabetes Other     ALLERGIES:  is allergic to penicillins.  MEDICATIONS:  Current Outpatient Medications  Medication Sig Dispense Refill  . acetaminophen (TYLENOL 8 HOUR) 650 MG CR tablet Take 1 tablet (650 mg total) by mouth every 8 (eight) hours as needed for pain. (Patient not taking: Reported on 11/08/2018) 30 tablet 0  . ARIPiprazole (ABILIFY) 15 MG tablet Take 15 mg by mouth at bedtime.    . divalproex (DEPAKOTE) 500 MG DR tablet Take 500 mg by mouth at bedtime.    . furosemide (LASIX) 40 MG tablet Take 2 tablets (80 mg total) by mouth 2 (two) times daily. 120 tablet 2  . metFORMIN (GLUCOPHAGE) 500 MG tablet Take 500 mg by mouth 2 (two) times daily with a meal.    .  ondansetron (ZOFRAN) 4 MG tablet Take 1 tablet (4 mg total) by mouth every 8 (eight) hours as needed for nausea or vomiting. (Patient not taking: Reported on 06/29/2017) 12 tablet 0  . potassium chloride SA (K-DUR,KLOR-CON) 20 MEQ tablet Take 1 tablet (20 mEq total) by mouth 2 (two) times daily. 30 tablet 1  . sodium fluoride (PREVIDENT 5000 PLUS) 1.1 % CREA dental cream Apply to tooth brush. Brush teeth for 2 minutes. Spit out excess-DO NOT swallow. Repeat nightly. (Patient not taking: Reported on 11/08/2018) 1 Tube prn   No current facility-administered medications for this visit.     REVIEW OF SYSTEMS:   Constitutional: ( - ) fevers, ( - )  chills , ( - ) night sweats Eyes: ( - ) blurriness of vision, ( - ) double vision, ( - ) watery eyes Ears, nose, mouth, throat, and face: ( - ) mucositis, ( - ) sore throat Respiratory: ( - ) cough, ( - ) dyspnea, ( - ) wheezes Cardiovascular: ( - ) palpitation, ( - ) chest discomfort, ( - ) lower extremity swelling Gastrointestinal:  ( - ) nausea, ( - ) heartburn, ( - ) change in bowel habits Skin: ( - ) abnormal skin rashes Lymphatics: ( - ) new lymphadenopathy, ( - ) easy bruising Neurological: ( - ) numbness, ( - ) tingling, ( - ) new weaknesses Behavioral/Psych: ( - ) mood change, ( - ) new changes  All other systems were reviewed with the patient and are negative.  PHYSICAL EXAMINATION: Unable to perform physical exam due to telephone visit.  Patient was alert and oriented during the phone visit.   LABORATORY DATA:  I have reviewed the data as listed Lab Results  Component Value Date   WBC 9.0 11/08/2018   HGB 12.4 11/08/2018   HCT 41.7 11/08/2018   MCV 97.9 11/08/2018   PLT 161 11/08/2018   Lab Results  Component Value Date   NA 140 11/08/2018   K 3.7 11/08/2018   CL 104 11/08/2018   CO2 29 11/08/2018    RADIOGRAPHIC STUDIES: I have personally reviewed the radiological images as listed and agreed with the findings in the  report.  PATHOLOGY: I have reviewed the pathology reports as documented in the oncologist history.

## 2019-01-17 NOTE — Telephone Encounter (Signed)
sw pt and daughter [name] to inform of virtual vist with Dr Maylon Peppers at 11 am 5/1 per sch msg. emaild pt daughter the webex information to access meeting

## 2019-01-17 NOTE — Telephone Encounter (Signed)
No return needed per 5/1 los

## 2019-01-17 NOTE — Progress Notes (Deleted)
Jodi [name]   DOB:January 03, 1961   [id number]    Assessment & Plan:  Tish Men, MD 01/17/2019  11:21 AM   Subjective:  ***  ROS: Constitutional: ( - ) fevers, ( - )  chills , ( - ) night sweats Ears, nose, mouth, throat, and face: ( - ) mucositis, ( - ) sore throat Respiratory: ( - ) cough, ( - ) dyspnea, ( - ) wheezes Cardiovascular: ( - ) palpitation, ( - ) chest discomfort, ( - ) lower extremity swelling Gastrointestinal:  ( - ) nausea, ( - ) heartburn, ( - ) change in bowel habits Skin: ( - ) abnormal skin rashes Behavioral/Psych: ( - ) mood change, ( - ) new changes  All other systems were reviewed with the patient and are negative.  Objective:  There were no vitals filed for this visit.  @IOBRIEF @  GENERAL: alert, no distress and comfortable SKIN: skin color, texture, turgor are normal, no rashes or significant lesions EYES: conjunctiva are pink and non-injected, sclera clear OROPHARYNX: no exudate, no erythema; lips, buccal mucosa, and tongue normal  NECK: supple, non-tender LUNGS: clear to auscultation with normal breathing effort HEART: regular rate & rhythm and no murmurs and no lower extremity edema ABDOMEN: soft, non-tender, non-distended, normal bowel sounds PSYCH: alert & oriented x 3, fluent speech NEURO: no focal motor/sensory deficits   Labs:  Lab Results  Component Value Date   WBC 9.0 11/08/2018   HGB 12.4 11/08/2018   HCT 41.7 11/08/2018   MCV 97.9 11/08/2018   PLT 161 11/08/2018   NEUTROABS 8.6 (H) 12/03/2015    Lab Results  Component Value Date   NA 140 11/08/2018   K 3.7 11/08/2018   CL 104 11/08/2018   CO2 29 11/08/2018    Studies:  No results found.

## 2019-01-20 ENCOUNTER — Telehealth: Payer: Self-pay | Admitting: *Deleted

## 2019-01-20 NOTE — Progress Notes (Signed)
Oncology Nurse Navigator Documentation  Met with Jodi [name] and her dtr Jodi [name] during tele-consult with Dr. Maylon Peppers.  . They voiced understanding of Dr. Lorette Ang explanation why she is not a good candidate for systemic tmt. . Jodi [name] indicated she does want to pursue tmt though voiced concerns re surgical option. Jodi [name] indicated they have a follow-up appt with Dr. Barton Dubois, [facility], this afternoon, during which they will re-visit surgery. . They voiced understanding of the importance of proceeding with RT ASAP if she declines surgery. . Dtr agreed to call me with update s/p appt with Dr. Barton Dubois. I encouraged them to call with questions/concerns, they verbalized understanding.  [name], RN, BSN Head & Neck Oncology Nurse [place] at [place] [phone]

## 2019-01-21 ENCOUNTER — Telehealth: Payer: Self-pay | Admitting: *Deleted

## 2019-01-21 NOTE — Telephone Encounter (Signed)
Oncology Nurse Navigator Documentation  Called Jodi [name] in follow-up to her questions about procedure for PET she is having this Thursday, explained the timeline of activities from arrival to scan completion.  She voiced appreciation for explanation.  [name], RN, BSN Head & Neck Oncology Nurse [place] at [place] [phone]

## 2019-01-21 NOTE — Telephone Encounter (Signed)
Oncology Nurse Navigator Documentation  Called Ms. Choma in follow-up to her tele-visit with Dr. Drucie Opitz last Friday and decisions she has made re her tmt.  She stated that she will have further discussion with him s/p her 5/7 PET re moving forward with surgery or RT.  She agreed to give me a call with update.  Dr. Isidore Moos informed.  [name], RN, BSN Head & Neck Oncology Nurse [place] at [place] [phone]

## 2019-01-23 ENCOUNTER — Other Ambulatory Visit (HOSPITAL_COMMUNITY)
Admission: RE | Admit: 2019-01-23 | Discharge: 2019-01-23 | Disposition: A | Discharge status: Home / Self Care | Payer: Medicare Other | Attending: *Deleted | Admitting: *Deleted

## 2019-01-23 ENCOUNTER — Other Ambulatory Visit: Payer: Self-pay

## 2019-01-23 ENCOUNTER — Ambulatory Visit (HOSPITAL_COMMUNITY)
Admission: RE | Admit: 2019-01-23 | Discharge: 2019-01-23 | Disposition: A | Discharge status: Home / Self Care | Payer: Medicare Other | Source: Ambulatory Visit | Attending: Hematology | Admitting: Hematology

## 2019-01-23 DIAGNOSIS — Z79899 Other long term (current) drug therapy: Secondary | ICD-10-CM | POA: Diagnosis not present

## 2019-01-23 DIAGNOSIS — C310 Malignant neoplasm of maxillary sinus: Secondary | ICD-10-CM | POA: Insufficient documentation

## 2019-01-23 LAB — COMPREHENSIVE METABOLIC PANEL
ALT: 12 U/L (ref 0–44)
AST: 13 U/L — ABNORMAL LOW (ref 15–41)
Albumin: 3.2 g/dL — ABNORMAL LOW (ref 3.5–5.0)
Alkaline Phosphatase: 66 U/L (ref 38–126)
Anion gap: 6 (ref 5–15)
BUN: 21 mg/dL — ABNORMAL HIGH (ref 6–20)
CO2: 32 mmol/L (ref 22–32)
Calcium: 8.8 mg/dL — ABNORMAL LOW (ref 8.9–10.3)
Chloride: 102 mmol/L (ref 98–111)
Creatinine, Ser: 0.78 mg/dL (ref 0.44–1.00)
GFR calc Af Amer: >60 mL/min (ref >60)
GFR calc non Af Amer: >60 mL/min (ref >60)
Glucose, Bld: 146 mg/dL — ABNORMAL HIGH (ref 70–99)
Potassium: 3.1 mmol/L — ABNORMAL LOW (ref 3.5–5.1)
Sodium: 140 mmol/L (ref 135–145)
Total Bilirubin: 0.5 mg/dL (ref 0.3–1.2)
Total Protein: 7.1 g/dL (ref 6.5–8.1)

## 2019-01-23 LAB — CBC WITH DIFFERENTIAL/PLATELET
Abs Immature Granulocytes: 0.03 10*3/uL (ref 0.00–0.07)
Basophils Absolute: 0 10*3/uL (ref 0.0–0.1)
Basophils Relative: 1 %
Eosinophils Absolute: 0.2 10*3/uL (ref 0.0–0.5)
Eosinophils Relative: 2 %
HCT: 41.1 % (ref 36.0–46.0)
Hemoglobin: 12.6 g/dL (ref 12.0–15.0)
Immature Granulocytes: 0 %
Lymphocytes Relative: 18 %
Lymphs Abs: 1.3 10*3/uL (ref 0.7–4.0)
MCH: 30 pg (ref 26.0–34.0)
MCHC: 30.7 g/dL (ref 30.0–36.0)
MCV: 97.9 fL (ref 80.0–100.0)
Monocytes Absolute: 0.6 10*3/uL (ref 0.1–1.0)
Monocytes Relative: 8 %
Neutro Abs: 5 10*3/uL (ref 1.7–7.7)
Neutrophils Relative %: 71 %
Platelets: 190 10*3/uL (ref 150–400)
RBC: 4.2 MIL/uL (ref 3.87–5.11)
RDW: 12.6 % (ref 11.5–15.5)
WBC: 7.1 10*3/uL (ref 4.0–10.5)
nRBC: 0 % (ref 0.0–0.2)

## 2019-01-23 LAB — GLUCOSE, CAPILLARY: Glucose-Capillary: 137 mg/dL — ABNORMAL HIGH (ref 70–99)

## 2019-01-23 MED ORDER — FLUDEOXYGLUCOSE F - 18 (FDG) INJECTION
15.0000 | Freq: Once | INTRAVENOUS | Status: AC | PRN
  Administered 2019-01-23: 15 via INTRAVENOUS

## 2019-01-28 LAB — MISC LABCORP TEST (SEND OUT): Labcorp test code: 806562

## 2019-01-29 ENCOUNTER — Telehealth: Payer: Self-pay | Admitting: *Deleted

## 2019-01-29 NOTE — Telephone Encounter (Signed)
Oncology Nurse Navigator Documentation  Spoke with [name], asked her if she has made tmt decision.  She indicated per her and dtr's tele-visit last Friday with Dr. Barton Dubois, [facility], she is moving forward with chemotherapy at [facility].  Dtr. [name] confirmed this information in my subsequent call to her, further indicated RT may be recommended s/p induction chemo.   I encouraged them to call me as needed.  [name], RN, BSN Head & Neck Oncology Nurse [place] at [place] [phone]

## 2019-04-15 ENCOUNTER — Ambulatory Visit
Admission: RE | Admit: 2019-04-15 | Discharge: 2019-04-15 | Disposition: A | Discharge status: Home / Self Care | Payer: Self-pay | Source: Ambulatory Visit | Attending: Radiation Oncology | Admitting: Radiation Oncology

## 2019-04-15 ENCOUNTER — Other Ambulatory Visit: Payer: Self-pay | Admitting: Radiation Oncology

## 2019-04-15 DIAGNOSIS — C310 Malignant neoplasm of maxillary sinus: Secondary | ICD-10-CM

## 2019-04-16 NOTE — Progress Notes (Signed)
Head and Neck Cancer Location of Tumor / Histology:  11/21/18 [facility]   Patient presented with symptoms of: She presented in April/May of 2018 with a history of nose bleeds and nasal congestion dating back to 09/2016 and 03/2016 per her report. She was referred to [facility] for treatment. She underwent surgery on 06/06/19 at [facility]. She did see Dr. Isidore Moos on 06/29/17 and radiation was recommended, but patient declined treatment. She followed at [facility] and a CT neck/chest was completed on 10/30/18 which demonstrated a new left sided maxillary mass. She had surgery on 11/21/18 with biopsy at [facility].   Biopsies of left maxillary wall, left maxillary sinus, left middle turbinate revealed: invasive moderately differentiated squamous cell carcinoma.   Nutrition Status Yes No Comments  Weight changes? '[x]' ? '[]' ? She reports losing a couple of pounds recently  Swallowing concerns? '[x]' ? '[]' ? She has difficulty chewing. She is eating softer foods.   PEG? '[]' ? '[x]' ?    Referrals Yes No Comments  Social Work? '[]' ? '[x]' ?   Dentistry? '[]' ? '[x]' ?   Swallowing therapy? '[]' ? '[x]' ?   Nutrition? '[]' ? '[x]' ?   Med/Onc? '[x]' ? '[x]' ? Dr. Harriette Ohara at [facility] received chemotherapy last dose 03/11/19   Safety Issues Yes No Comments  Prior radiation? '[]' ? '[x]' ?   Pacemaker/ICD? '[]' ? '[x]' ?   Possible current pregnancy? '[]' ? '[x]' ?   Is the patient on methotrexate? '[]' ? '[x]' ?    Tobacco/Marijuana/Snuff/ETOH use: She is a former smoker quitting 14-15 years ago. She drinks alcohol rarely.   Past/Anticipated interventions by otolaryngology, if any:  11/21/18 NASAL/SINUS ENDOSCOPY, SURGICAL WITH MAXILLARY ANTROSTOMY; WITH REMOVAL OF TISSUE FROM MAXILLARY SINUS Left General  Clean Contaminated     NASAL/SINUS ENDO-OR; [facility] Left General  Clean Contaminated     STEREOTACTIC COMPUTER-ASSISTED (NAVIGATIONAL) PROCEDURE; CRANIAL, EXTRADURAL Left General  Clean Contaminated      Surgeon Surgeon  Role Service Panel  Edyth Gunnels, MD Primary ENT 1  Charolett Bumpers Myrtis Ser, MD        Past/Anticipated interventions by medical oncology, if any:  01/17/19 telephone visit with Dr. Maylon Peppers.  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  Path showed Stage IVA (pT4aN0M0) squamous cell carcinoma with negative margins.  Due to the high risk of disease recurrence, adjuvant radiation was strongly recommended, but the patient ultimately declined adjuvant treatment.  She underwent CT neck and chest in early 10/2018, which showed a new, locally advanced left maxillary sinus mass, the biopsy of which showed invasive moderately differentiated squamous cell carcinoma.  CT maxillofacial in late 10/2018 showed an infiltrative mass extending through the maxillary alveolar bone, walls of the left maxillary sinus, left pterygoid plates and muscles and left-sided nasal bones, and extending through the left floor of the orbit -She met with ENT at [facility], and after discussions of some of the potential benefits and risks, patient declined surgical resection, and was referred to medical oncology and radiation oncology for discussion of chemotherapy and radiation, respectively -I reviewed the imaging and pathology results in detail with the patient; I also discussed the NCCN guideline with the patient in detail  -  Given the very locally advanced disease, I have ordered PET scan to rule out any metastatic disease -If there is no evidence of metastatic disease, the  standard-of-care approach in the absence of upfront surgery is definitive chemoradiation -I discussed in detail with the patient some of the benefits and potential side effects of chemotherapy with the patient  -Some of the short term side-effects included, though not limited to, risk of fatigue, weight loss, tumor lysis syndrome, risk of allergic reactions, pancytopenia, life-threatening infections, need for transfusions of blood products, nausea, vomiting, change in bowel habits, admission to hospital for various reasons, and risks of death -Long term side-effects are also discussed including permanent damage to nerve function, chronic fatigue, and rare secondary malignancy including bone marrow disorders  -In light of the patient's multiple comorbidities, including severe pulmonary hypertension with cor pulmonale, history of heart failure with preserved EF, and bipolar disorder, she would likely tolerate systemic therapy very poorly and is at high risk for potential serious adverse outcomes from chemotherapy; furthermore, any benefits from chemotherapy would likely be off set by the toxicities from the chemotherapy  -Therefore, I recommend the patient to strongly consider definitive radiation if PET scan is negative for metastatic disease; alternatively, she may re-consider surgery as an option, but it would likely be associated very significant comorbidities   Goals of care discussion -Patient and her daughter are somewhat uncertain about the goals of care -I discussed with the patient that before she and her daughter decide on surgery vs. radiation, it would be very importance to determine what their goals of care are.  -If they are hoping to control the cancer with the goal to improve quality of life, then they may consider taking a less aggressive approach, such as radiation, to delay tumor progression and be associated with less toxicities, but it would also likely have lower chance of cure.   -However, if her goal is to prolong her life for as long as possible regardless of potential side effects, then upfront surgical resection, followed by radiation, would likely have higher chance of cure, but would also be likely associated with substantial side effects -In either case, given her comorbidities, chemotherapy would likely be associated with more harms than benefits, and therefore, I would not recommend chemotherapy at this time -Patient and her daughter expressed understanding, and would talk further before making a decision    Dr. Brooks Sailors Hematology:  02/04/2019: [place] induction chemo with Kies regimen   Current Complaints / other details:   PET 01/23/19 04/07/19 Dr. Phoebe Sharps (Radiation Oncology at [facility]): RECOMMENDATIONS: CANCER TREATMENT: Given recurrent maxillary sinus cancer with invasion of the left orbit, following induction chemo we recommended twice-daily radiation therapy with concurrent chemotherapy to also lessen the risk to critical normal structures. Alternatively, we discussed once daily treatment for 7 weeks though it comes with increased risk of blindness in the left eye. During the discussion, the risks, benefits, and rationale of radiation therapy were explained in detail. We also reviewed the potential acute and late term side effects of radiation therapy in this setting. After extensive discussion of benefits and risks, patient would like to take more time to think about whether he would like to pursue therapy or pursue a more hospice/palliative approach (given the low likelihood of cure).  RADIATION PLANNING: She has previously met with Dr. Isidore Moos at [facility], Ms. [place] prefers treatment closer to home, referral placed to Dr. Isidore Moos  03/17/2019: CT Sinus/Neck/Chest - Marked interval decrease in size of a  previously demonstrated large destructive tumor centered within the left maxillary sinus and hard palate. Persistent regions of soft tissue thickening, some  nonspecific and some suspicious for residual tumor, as described. Most notably, soft tissue thickening along the anterior maxillary alveolar ridge and soft tissue fullness within the region of the left pterygoid musculature, is suspicious for residual tumor. - Stable asymmetrically prominent left level 1A and 1B lymph nodes measuring up to 1 cm in short axis. These nodes demonstrate FDG avidity on prior PET CT and findings are suspicious for nodal metastatic disease. No developing lymphadenopathy elsewhere within the neck - 1.4 cm peripherally enhancing subcutaneous cystic lesion within the submental region, increased in size since prior PET CT and suspicious for possible tumor deposit. -No intrathoracic metastasis.

## 2019-04-17 NOTE — Progress Notes (Signed)
Radiation Oncology         [phone]) [phone] ________________________________  Name: Jodi [name] MRN: [id number]  Date: 04/18/2019  DOB: January 02, 1961  Follow Up New Telephone Note due to pandemic risks, WebEx couldn't be accessed by patient  CC: Bartholome Bill, MD  Bartholome Bill, MD  Diagnosis and Prior Radiotherapy:       ICD-10-CM   1. Malignant neoplasm of maxillary sinus ([place])  C31.0    Cancer Staging Malignant neoplasm of maxillary sinus ([place]) Staging form: Maxillary Sinus, AJCC 8th Edition - Clinical: Stage IVA (cT4a, cN2b, cM0) - Signed by Eppie Gibson, MD on 04/20/2019 - Pathologic: Stage III (pT3, pN0, cM0) - Signed by Eppie Gibson, MD on 06/29/2017    CHIEF COMPLAINT:  Here for re-evaluation of recurrent locally advanced maxillary sinus cancer  Narrative:  The patient returns today for re-evaluation. She was last seen in our office on 01/14/2019. Since then, she spoke with Dr. Maylon Peppers on 01/17/2019, who explained why the patient is not a good candidate for systemic treatment, and agreed to undergo PET scan. She then met with Dr. Charolett Bumpers the same day to discuss surgical options.  PET scan was performed on 01/23/2019 and revealed: intensely hypermetabolic mass centered within the left maxillary sinus consistent with recurrence/residual squamous cell carcinoma' mass extends across midline to expected space of the right axillary sinus and through the anterior aspect of the right maxillary bone to the soft tissues adjacent to the right nare; mass extends superiorly to the border of the inferior orbital wall and anterior medially to the border of the frontal sinus; probable metastatic lymph node in the submental space on the left; right upper lobe pulmonary nodule with mild metabolic activity.  She then met with Dr. Harriette Ohara at [facility] who started her on induction chemotherapy with Kies regimen (carbo/taxol/cetuximab on a 6 week schedule) on 02/04/2019.   She then underwent  sinus/neck/chest CT at [facility] on 03/17/2019, with results revealing: marked interval decrease in size of a previously-demonstrated large destructive tumor centered within the left maxillary sinus and hard palate; persistent regions of soft tissue thickening, some nonspecific and some suspicious for residual tumor, most notably along the anterior maxillary alveolar ridge and soft tissue fullness within the region of the left pterygoid musculature; stable asymmetrically prominent left lymph nodes measuring up to 1 cm, no developing lymphadenopathy elsewhere within the neck; 1.4 cm peripherally enhancing subcutaneous cystic lesion within the submental region, increased in size since prior PET; no intrathoracic metastasis.  The patient declined further surgical intervention and has been referred back to [place] for consideration of radiation therapy. She feels she tolerated induction chemotherapy well, anticipates concurrent chemotherapy with RT.  ALLERGIES:  is allergic to penicillins.  Meds: Current Outpatient Medications  Medication Sig Dispense Refill  . acetaminophen (TYLENOL 8 HOUR) 650 MG CR tablet Take 1 tablet (650 mg total) by mouth every 8 (eight) hours as needed for pain. 30 tablet 0  . ARIPiprazole (ABILIFY) 15 MG tablet Take 15 mg by mouth at bedtime.    [name] Dextran 70-Hypromellose (ARTIFICIAL TEARS) 0.1-0.3 % SOLN 1 drop 4 (four) times a day as needed (dry eyes).    [name] divalproex (DEPAKOTE) 500 MG DR tablet Take 500 mg by mouth at bedtime.    . furosemide (LASIX) 40 MG tablet Take 2 tablets (80 mg total) by mouth 2 (two) times daily. 120 tablet 2  . magnesium oxide (MAG-OX) 400 MG tablet Take by mouth.    . metFORMIN (GLUCOPHAGE) 500  MG tablet Take 500 mg by mouth 2 (two) times daily with a meal.    . potassium chloride SA (K-DUR,KLOR-CON) 20 MEQ tablet Take 1 tablet (20 mEq total) by mouth 2 (two) times daily. 30 tablet 1  . ondansetron (ZOFRAN) 4 MG tablet Take 1 tablet (4 mg total) by mouth every  8 (eight) hours as needed for nausea or vomiting. (Patient not taking: Reported on 06/29/2017) 12 tablet 0  . sodium fluoride (PREVIDENT 5000 PLUS) 1.1 % CREA dental cream Apply to tooth brush. Brush teeth for 2 minutes. Spit out excess-DO NOT swallow. Repeat nightly. (Patient not taking: Reported on 11/08/2018) 1 Tube prn   No current facility-administered medications for this encounter.     Physical Findings: The patient is in no acute distress. Patient is alert and oriented. Wt Readings from Last 3 Encounters:  06/29/17 (!) 301 lb 9.6 oz (136.8 kg)  02/07/17 (!) 343 lb 3.2 oz (155.7 kg)  02/15/16 (!) 336 lb (152.4 kg)    vitals were not taken for this visit. .    Lab Findings: Lab Results  Component Value Date   WBC 7.1 01/23/2019   HGB 12.6 01/23/2019   HCT 41.1 01/23/2019   MCV 97.9 01/23/2019   PLT 190 01/23/2019    Lab Results  Component Value Date   TSH 0.933 12/15/2013    Radiographic Findings: No results found.  Impression/Plan:   Patient and I again discussed the risks, benefits, logistics, and side effects of RT.  She would like to proceed with RT planning.   I do not think it is realistic she will be able to come her BID for treatments.  We will try daily treatments, hopefully completing a 7 wk regimen.  Simulation is to be scheduled in near future.  [name], RN, our Head and Neck Oncology Navigator was present for the visit.  He will provide support.  Due to anticipated side effects that may impact her eating, will refer to nutritionist.   This encounter was provided by telemedicine platform phone as the patient could not access Webex. We tried to connect with her daughter who was not obtainable. The patient has given verbal consent for this type of encounter and has been advised to only accept a meeting of this type in a secure network environment. The time spent during this encounter was 25 minutes. The attendants for this meeting include Eppie Gibson  and Silvestre Mesi.  During the encounter, Eppie Gibson was located at [facility] Radiation Oncology Department.  Krissy Page Pucciarelli was located at home.   _____________________________________   Eppie Gibson, MD  This document serves as a record of services personally performed by Eppie Gibson, MD. It was created on her behalf by Wilburn Mylar, a trained medical scribe. The creation of this record is based on the scribe's personal observations and the provider's statements to them. This document has been checked and approved by the attending provider.

## 2019-04-18 ENCOUNTER — Encounter: Payer: Self-pay | Admitting: *Deleted

## 2019-04-18 ENCOUNTER — Other Ambulatory Visit: Payer: Self-pay

## 2019-04-18 ENCOUNTER — Encounter: Payer: Self-pay | Admitting: Radiation Oncology

## 2019-04-18 ENCOUNTER — Ambulatory Visit
Admission: RE | Admit: 2019-04-18 | Discharge: 2019-04-18 | Disposition: A | Discharge status: Home / Self Care | Payer: Medicare Other | Source: Ambulatory Visit | Attending: Radiation Oncology | Admitting: Radiation Oncology

## 2019-04-18 DIAGNOSIS — C310 Malignant neoplasm of maxillary sinus: Secondary | ICD-10-CM

## 2019-04-18 NOTE — Progress Notes (Signed)
Oncology Nurse Navigator Documentation  Joined [name] during Telemedicine consult with Dr. Isidore Moos.  Dtr was not available. She reported recent completion of 6 weeks induction chemo at [facility]. She voiced understanding:  Plan for 7 wks RT, M-F.  She noted she will be receiving additional chemo at [facility] during RT.  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 I provided her my phone #, encouraged her to call me with questions/concerns.  She agreed.  [name], RN, BSN Head & Neck Oncology Nurse [place] at [place] [phone]

## 2019-04-20 ENCOUNTER — Other Ambulatory Visit: Payer: Self-pay | Admitting: Radiation Oncology

## 2019-04-20 ENCOUNTER — Encounter: Payer: Self-pay | Admitting: Radiation Oncology

## 2019-04-20 DIAGNOSIS — C310 Malignant neoplasm of maxillary sinus: Secondary | ICD-10-CM

## 2019-04-21 ENCOUNTER — Telehealth: Payer: Self-pay

## 2019-04-21 ENCOUNTER — Telehealth: Payer: Self-pay | Admitting: *Deleted

## 2019-04-21 DIAGNOSIS — C310 Malignant neoplasm of maxillary sinus: Secondary | ICD-10-CM

## 2019-04-21 NOTE — Progress Notes (Signed)
Has armband been applied?  Yes  Does patient have an allergy to IV contrast dye?: No   Has patient ever received premedication for IV contrast dye?: N/A  Does patient take metformin?: Yes   If patient does take metformin when was the last dose: 04/29/19. The day before this encounter  Date of lab work: 04/30/19 (same day as simulation) BUN: 17 CR: 0.86 EGFR:  >60  IV site: Right AC  Has IV site been added to flowsheet?  Yes.   BP (!) 109/58 (BP Location: Right Arm)   Pulse 68   Temp 98 F (36.7 C) (Oral)   Resp 18   Wt (!) 318 lb 9.6 oz (144.5 kg)   SpO2 97% Comment: room air  BMI 54.69 kg/m

## 2019-04-21 NOTE — Telephone Encounter (Signed)
Scheduled appt per 8/03 sch mesasge - pt aware of appt date and time for nutritionist.

## 2019-04-21 NOTE — Telephone Encounter (Signed)
PATIENT TO HAVE NUTRITION APPT. [place] ON 04-23-19 @ 2:30 PM

## 2019-04-21 NOTE — Telephone Encounter (Signed)
Oncology Nurse Navigator Documentation  Called pt's dtr, Jodi [name], in follow-up to her mother's tele-consult with Dr. Isidore Moos last Friday. I summarized the discussion, plan for 7 wks daily RT. I explained next step is scheduling of CT SIM preceded by lab for next week.  She stated her schedule is very flexible. I later called her with date/time:  8/12, 12:30 lab, 1:15 IV start, 2:00 CT SIM.  She voiced understanding.  [name], RN, BSN Head & Neck Oncology Nurse [place] at [place] [phone]

## 2019-04-23 ENCOUNTER — Inpatient Hospital Stay: Payer: Medicare Other | Attending: Hematology

## 2019-04-23 NOTE — Progress Notes (Signed)
Nutrition  Called patient for nutrition consult.  No answer.  Left message on voicemail with call back number.    Amia Rynders B. Zenia Resides, [place], [place] Registered Dietitian [phone] (pager)

## 2019-04-29 ENCOUNTER — Telehealth: Payer: Self-pay

## 2019-04-29 NOTE — Telephone Encounter (Signed)
I called and spoke to Jodi [name] this morning. I reminded her of her lab and CT simulation appointment tomorrow. I have asked her not to take her Metformin tomorrow and the following 2 days until she is able to have repeat labs to verify her kidney function on Friday. She voiced her understanding.

## 2019-04-30 ENCOUNTER — Other Ambulatory Visit: Payer: Self-pay

## 2019-04-30 ENCOUNTER — Encounter: Payer: Self-pay | Admitting: *Deleted

## 2019-04-30 ENCOUNTER — Ambulatory Visit
Admission: RE | Admit: 2019-04-30 | Discharge: 2019-04-30 | Disposition: A | Discharge status: Home / Self Care | Payer: Medicare Other | Source: Ambulatory Visit | Attending: Radiation Oncology | Admitting: Radiation Oncology

## 2019-04-30 ENCOUNTER — Telehealth: Payer: Self-pay

## 2019-04-30 VITALS — BP 109/58 | HR 68 | Temp 98.0°F | Resp 18 | Wt 318.6 lb

## 2019-04-30 DIAGNOSIS — C310 Malignant neoplasm of maxillary sinus: Secondary | ICD-10-CM

## 2019-04-30 DIAGNOSIS — Z510 Encounter for antineoplastic radiation therapy: Secondary | ICD-10-CM | POA: Diagnosis not present

## 2019-04-30 LAB — BUN & CREATININE (CHCC)
BUN: 17 mg/dL (ref 6–20)
Creatinine: 0.86 mg/dL (ref 0.44–1.00)
GFR, Est AFR Am: >60 mL/min (ref >60)
GFR, Estimated: >60 mL/min (ref >60)

## 2019-04-30 MED ORDER — SODIUM CHLORIDE 0.9% FLUSH
10.0000 mL | Freq: Once | INTRAVENOUS | Status: AC
  Administered 2019-04-30: 10 mL via INTRAVENOUS

## 2019-04-30 NOTE — Telephone Encounter (Signed)
Nutrition  Patient had phone visit with RD on 8/5.  RD called and left message with no return call back.    Called patient again today.  Spoke with patient and unable to talk with RD at this time.  Agreeable to phone visit on Tuesday, 8/18.    Calyn Sivils B. Zenia Resides, [place], [place] Registered Dietitian [phone] (pager)

## 2019-05-01 NOTE — Progress Notes (Signed)
Oncology Nurse Navigator Documentation  To provide support, encouragement and care continuity, met with Jodi [name] prior/during/after CT SIM. She was accompanied by her dtr Jodi [name].  In support of Aquaplast mask discussion during 7/31Telemedicine consult with Dr. Isidore Moos, I showed her example of open-face mask, explained again purpose/importance of mask during XRT, fabrication procedure.  She expressed some uncertainty but willingness to proceed  She tolerated procedure without difficulty.      I toured them to [facility] treatment area, explained procedures for lobby registration, arrival to Radiation Waiting, arrival to tmt dressing room and preparation for tmt.  They voiced understanding.    She voiced understanding I will be available to provide support on first day of XRT, therapists also will be available throughout tmt. I encouraged them to call me prior to 8/24 [place].    [name], RN, BSN Head & Neck Oncology Nurse [place] at [place] [phone]

## 2019-05-02 ENCOUNTER — Ambulatory Visit
Admission: RE | Admit: 2019-05-02 | Discharge: 2019-05-02 | Disposition: A | Discharge status: Home / Self Care | Payer: Medicare Other | Source: Ambulatory Visit | Attending: Radiation Oncology | Admitting: Radiation Oncology

## 2019-05-02 ENCOUNTER — Telehealth: Payer: Self-pay

## 2019-05-02 ENCOUNTER — Other Ambulatory Visit: Payer: Self-pay

## 2019-05-02 DIAGNOSIS — Z510 Encounter for antineoplastic radiation therapy: Secondary | ICD-10-CM | POA: Diagnosis not present

## 2019-05-02 DIAGNOSIS — C310 Malignant neoplasm of maxillary sinus: Secondary | ICD-10-CM

## 2019-05-02 LAB — BUN & CREATININE (CHCC)
BUN: 18 mg/dL (ref 6–20)
Creatinine: 0.79 mg/dL (ref 0.44–1.00)
GFR, Est AFR Am: >60 mL/min (ref >60)
GFR, Estimated: >60 mL/min (ref >60)

## 2019-05-02 NOTE — Progress Notes (Signed)
Head and Neck Cancer Simulation, IMRT treatment planning, and Special treatment procedure note   Outpatient  Diagnosis:    ICD-10-CM   1. Malignant neoplasm of maxillary sinus ([place])  C31.0     The patient was taken to the CT simulator and identity was confirmed.  All relevant records and images related to the planned course of therapy were reviewed.  The patient freely provided informed written consent to proceed with treatment after reviewing the details related to the planned course of therapy. The consent form was witnessed and verified by the simulation staff.    The patient was laid in the supine position on the table. An Aquaplast head and shoulder mask was custom fitted to the patient's anatomy. High-resolution CT axial imaging was obtained of the head and neck with contrast. I verified that the quality of the imaging is good for treatment planning. 1 Medically Necessary Treatment Device was fabricated and supervised by me: Aquaplast mask.  Treatment planning note I plan to treat the patient with IMRT. I plan to treat the patient's tumor and bilateral neck nodes. I plan to treat to a total dose of 70 Gray in 35  fractions. Dose calculation was ordered from dosimetry.  IMRT planning Note  IMRT is medically necessary and an important modality to deliver adequate dose to the patient's at risk tissues while sparing the patient's normal structures, including the: esophagus, parotid tissue, mandible, brain stem, spinal cord, oral cavity, brachial plexus.  This justifies the use of IMRT in the patient's treatment.   Special Treatment Procedure Note:  The patient will be receiving chemotherapy concurrently. Chemotherapy heightens the risk of side effects. I have considered this during the patient's treatment planning process and will monitor the patient accordingly for side effects on a weekly basis. Concurrent chemotherapy increases the complexity of this patient's treatment and therefore this  constitutes a special treatment procedure.  -----------------------------------  Eppie Gibson, MD

## 2019-05-02 NOTE — Telephone Encounter (Signed)
I called [name] and left a voice mail informing her that she is able to restart her metformin due to her lab work this afternoon was within normal ranges. I also called and spoke directly to her daughter, Jodi [name] and informed her of the same. She voiced her appreciation and knows to call me with any further questions or concerns.

## 2019-05-06 ENCOUNTER — Inpatient Hospital Stay: Payer: Medicare Other

## 2019-05-06 NOTE — Progress Notes (Signed)
Nutrition Assessment:  Referral from Dr Isidore Moos.  58 year old female with stage IV recurrent SCC of left maxillary sinus.  Patient planning to receive radiation.  Past medical history of tumor resection in 2018 with flap reconstruction, DM, HTN, bipolar, HTN, heart failure.   Spoke with patient via phone.  Reports that she was napping but agreeable to proceed with visit via phone.  Patient reports that she has a good appetite. Does not eat breakfast.  Reports for lunch will eat soft foods like soups, pasta, ravioli.  Supper is much the same.  May eat soft chicken or fish with green beans, potato.  Has not tried oral nutrition supplements.    Denies nutrition impact symptoms.      Medications: lasix, magox, MVI, zofran, KCL  Labs: no recent  Anthropometrics:   Height: 64 inches Weight: 318 lb 9.6 oz (8/12) UBW: little over 300lb per patient BMI: 54  No recent weight since 2018.    NUTRITION DIAGNOSIS: Predicted suboptimal intake related to cancer related treatment side effects as evidenced by planning radiation treatment    INTERVENTION:  Discussed importance of consuming foods rich in protein.  Provided examples of those foods.  Discussed how to make foods soft, moist.   Patient interested in oral nutrition supplements lower in sugar and provided this for patient.      MONITORING, EVALUATION, GOAL: patient will consume adequate calories and protein to maintain muscle mass during treatment   NEXT VISIT: Wednesday, Sept 2nd after radiation.  Explained how to get to [place] office  Huel Centola B. Zenia Resides, [place], [place] Registered Dietitian [phone] (pager)

## 2019-05-09 DIAGNOSIS — Z510 Encounter for antineoplastic radiation therapy: Secondary | ICD-10-CM | POA: Diagnosis not present

## 2019-05-12 ENCOUNTER — Ambulatory Visit
Admission: RE | Admit: 2019-05-12 | Discharge: 2019-05-12 | Disposition: A | Discharge status: Home / Self Care | Payer: Medicare Other | Source: Ambulatory Visit | Attending: Radiation Oncology | Admitting: Radiation Oncology

## 2019-05-12 ENCOUNTER — Other Ambulatory Visit: Payer: Self-pay

## 2019-05-12 DIAGNOSIS — C310 Malignant neoplasm of maxillary sinus: Secondary | ICD-10-CM

## 2019-05-12 DIAGNOSIS — Z510 Encounter for antineoplastic radiation therapy: Secondary | ICD-10-CM | POA: Diagnosis not present

## 2019-05-12 MED ORDER — SONAFINE EX EMUL
1.0000 "application " | Freq: Once | CUTANEOUS | Status: AC
  Administered 2019-05-12: 1 via TOPICAL

## 2019-05-12 NOTE — Progress Notes (Signed)
Pt here for patient teaching.  Pt given Radiation and You booklet, Managing Acute Radiation Side Effects for Head and Neck Cancer handout, skin care instructions and Sonafine.  Reviewed areas of pertinence such as fatigue, hair loss, mouth changes, skin changes, throat changes and taste changes . Pt able to give teach back of to pat skin, use unscented/gentle soap and drink plenty of water,apply Sonafine bid and avoid applying anything to skin within 4 hours of treatment. Pt verbalizes understanding of information given and will contact nursing with any questions or concerns.     [email or web address]

## 2019-05-13 ENCOUNTER — Other Ambulatory Visit: Payer: Self-pay

## 2019-05-13 ENCOUNTER — Ambulatory Visit
Admission: RE | Admit: 2019-05-13 | Discharge: 2019-05-13 | Disposition: A | Discharge status: Home / Self Care | Payer: Medicare Other | Source: Ambulatory Visit | Attending: Radiation Oncology | Admitting: Radiation Oncology

## 2019-05-13 DIAGNOSIS — Z510 Encounter for antineoplastic radiation therapy: Secondary | ICD-10-CM | POA: Diagnosis not present

## 2019-05-14 ENCOUNTER — Other Ambulatory Visit: Payer: Self-pay

## 2019-05-14 ENCOUNTER — Ambulatory Visit
Admission: RE | Admit: 2019-05-14 | Discharge: 2019-05-14 | Disposition: A | Discharge status: Home / Self Care | Payer: Medicare Other | Source: Ambulatory Visit | Attending: Radiation Oncology | Admitting: Radiation Oncology

## 2019-05-14 DIAGNOSIS — Z510 Encounter for antineoplastic radiation therapy: Secondary | ICD-10-CM | POA: Diagnosis not present

## 2019-05-15 ENCOUNTER — Other Ambulatory Visit: Payer: Self-pay

## 2019-05-15 ENCOUNTER — Ambulatory Visit
Admission: RE | Admit: 2019-05-15 | Discharge: 2019-05-15 | Disposition: A | Discharge status: Home / Self Care | Payer: Medicare Other | Source: Ambulatory Visit | Attending: Radiation Oncology | Admitting: Radiation Oncology

## 2019-05-15 DIAGNOSIS — Z510 Encounter for antineoplastic radiation therapy: Secondary | ICD-10-CM | POA: Diagnosis not present

## 2019-05-16 ENCOUNTER — Other Ambulatory Visit: Payer: Self-pay

## 2019-05-16 ENCOUNTER — Ambulatory Visit
Admission: RE | Admit: 2019-05-16 | Discharge: 2019-05-16 | Disposition: A | Discharge status: Home / Self Care | Payer: Medicare Other | Source: Ambulatory Visit | Attending: Radiation Oncology | Admitting: Radiation Oncology

## 2019-05-16 DIAGNOSIS — Z510 Encounter for antineoplastic radiation therapy: Secondary | ICD-10-CM | POA: Diagnosis not present

## 2019-05-19 ENCOUNTER — Other Ambulatory Visit: Payer: Self-pay | Admitting: Radiation Oncology

## 2019-05-19 ENCOUNTER — Other Ambulatory Visit: Payer: Self-pay

## 2019-05-19 ENCOUNTER — Ambulatory Visit
Admission: RE | Admit: 2019-05-19 | Discharge: 2019-05-19 | Disposition: A | Discharge status: Home / Self Care | Payer: Medicare Other | Source: Ambulatory Visit | Attending: Radiation Oncology | Admitting: Radiation Oncology

## 2019-05-19 DIAGNOSIS — C310 Malignant neoplasm of maxillary sinus: Secondary | ICD-10-CM

## 2019-05-19 DIAGNOSIS — Z510 Encounter for antineoplastic radiation therapy: Secondary | ICD-10-CM | POA: Diagnosis not present

## 2019-05-19 MED ORDER — LIDOCAINE VISCOUS HCL 2 % MT SOLN: OROMUCOSAL | 5 refills | Status: DC

## 2019-05-20 ENCOUNTER — Ambulatory Visit
Admission: RE | Admit: 2019-05-20 | Discharge: 2019-05-20 | Disposition: A | Discharge status: Home / Self Care | Payer: Medicare Other | Source: Ambulatory Visit | Attending: Radiation Oncology | Admitting: Radiation Oncology

## 2019-05-20 ENCOUNTER — Other Ambulatory Visit: Payer: Self-pay

## 2019-05-20 DIAGNOSIS — Z510 Encounter for antineoplastic radiation therapy: Secondary | ICD-10-CM | POA: Insufficient documentation

## 2019-05-20 DIAGNOSIS — C310 Malignant neoplasm of maxillary sinus: Secondary | ICD-10-CM | POA: Insufficient documentation

## 2019-05-21 ENCOUNTER — Telehealth: Payer: Self-pay | Admitting: Hematology

## 2019-05-21 ENCOUNTER — Inpatient Hospital Stay: Payer: Medicare Other | Attending: Hematology

## 2019-05-21 ENCOUNTER — Ambulatory Visit
Admission: RE | Admit: 2019-05-21 | Discharge: 2019-05-21 | Disposition: A | Discharge status: Home / Self Care | Payer: Medicare Other | Source: Ambulatory Visit | Attending: Radiation Oncology | Admitting: Radiation Oncology

## 2019-05-21 DIAGNOSIS — I509 Heart failure, unspecified: Secondary | ICD-10-CM | POA: Insufficient documentation

## 2019-05-21 DIAGNOSIS — E119 Type 2 diabetes mellitus without complications: Secondary | ICD-10-CM | POA: Insufficient documentation

## 2019-05-21 DIAGNOSIS — R197 Diarrhea, unspecified: Secondary | ICD-10-CM | POA: Insufficient documentation

## 2019-05-21 DIAGNOSIS — Z510 Encounter for antineoplastic radiation therapy: Secondary | ICD-10-CM | POA: Diagnosis not present

## 2019-05-21 DIAGNOSIS — C310 Malignant neoplasm of maxillary sinus: Secondary | ICD-10-CM | POA: Insufficient documentation

## 2019-05-21 DIAGNOSIS — I10 Essential (primary) hypertension: Secondary | ICD-10-CM | POA: Insufficient documentation

## 2019-05-21 NOTE — Telephone Encounter (Signed)
I received a call from [place] at [facility] to schedule a medonc appt w/a different provider. Per Elta Guadeloupe Ms. Toguchi has previously seen Dr. Maylon Peppers but preferred someone else. I scheduled Ms. Clymer w/Dr. Irene Limbo on 9/14 at [facility] will notify Ms. Lefebre of her appt w/Dr. Maylon Peppers.   I sent a staff msg to both Drs. Irene Limbo and Maylon Peppers about the pt's transfer to another MD.

## 2019-05-21 NOTE — Progress Notes (Signed)
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  Patient receiving radiation.    Spoke with patient following radiation today.  Patient reports that she continues to eat soft foods (ravoli, yogurt, boiled eggs, meats cooked in crockpot, rice, grits, mashed potatoes, cabbage, green beans, collards, pork n beans, pinto beans, lunch meat.  Sometimes does not eat breakfast but eats lunch and dinner.  Has not tried oral nutrition supplements    Medications: reviewed  Labs: no new  Anthropometrics:   Patient reports weight yesterday at radiation was 319 lb stable from 318 lb on 8/12  NUTRITION DIAGNOSIS: Predicted suboptimal intake stable    INTERVENTION:  Reviewed foods that contain protein and encouraged protein food at every meal.   Provided samples of oral nutrition supplements that are lower in carbohydrate.   Contact information provided    MONITORING, EVALUATION, GOAL: Patient will consume adequate calories and protein to maintain muscle mass during treatment.    NEXT VISIT: Sept 16 after radiation, Wednesday  Lashell Moffitt B. Zenia Resides, [place], [place] Registered Dietitian [phone] (pager)

## 2019-05-22 ENCOUNTER — Ambulatory Visit: Payer: Medicare Other

## 2019-05-23 ENCOUNTER — Other Ambulatory Visit: Payer: Self-pay

## 2019-05-23 ENCOUNTER — Ambulatory Visit
Admission: RE | Admit: 2019-05-23 | Discharge: 2019-05-23 | Disposition: A | Discharge status: Home / Self Care | Payer: Medicare Other | Source: Ambulatory Visit | Attending: Radiation Oncology | Admitting: Radiation Oncology

## 2019-05-23 DIAGNOSIS — Z510 Encounter for antineoplastic radiation therapy: Secondary | ICD-10-CM | POA: Diagnosis not present

## 2019-05-27 ENCOUNTER — Encounter: Payer: Self-pay | Admitting: *Deleted

## 2019-05-27 ENCOUNTER — Other Ambulatory Visit: Payer: Self-pay

## 2019-05-27 ENCOUNTER — Ambulatory Visit
Admission: RE | Admit: 2019-05-27 | Discharge: 2019-05-27 | Disposition: A | Discharge status: Home / Self Care | Payer: Medicare Other | Source: Ambulatory Visit | Attending: Radiation Oncology | Admitting: Radiation Oncology

## 2019-05-27 DIAGNOSIS — Z510 Encounter for antineoplastic radiation therapy: Secondary | ICD-10-CM | POA: Diagnosis not present

## 2019-05-27 NOTE — Progress Notes (Signed)
Oncology Nurse Navigator Documentation  Met with Ms. Badon s/p today's RT to check on her well-being, escort her to [place].  She reported toleration of tmts without significant issues, noted she was developing mouth sores which I encouraged her to address with Dr. Isidore Moos.  I acknowledged her recent request for transfer of chemo treatments from [facility] to [facility], indicated appt scheduled next Monday 1:00 with Dr. Irene Limbo.  I explained there is a possibility Dr. Irene Limbo may not proceed with same chemo regime as Ramireno and recommend a different therapy based on his expertise.  She voiced understanding.  I later called dtr to confirm appt and explain same guidance. I encouraged Ms. Lacks to call me with questions/concerns, noted I will join her next Monday during appt with Dr. Irene Limbo.  She voiced appreciation.  [name], RN, BSN Head & Neck Oncology Nurse [facility] [place] [phone]

## 2019-05-28 ENCOUNTER — Ambulatory Visit
Admission: RE | Admit: 2019-05-28 | Discharge: 2019-05-28 | Disposition: A | Discharge status: Home / Self Care | Payer: Medicare Other | Source: Ambulatory Visit | Attending: Radiation Oncology | Admitting: Radiation Oncology

## 2019-05-28 ENCOUNTER — Other Ambulatory Visit: Payer: Self-pay

## 2019-05-28 DIAGNOSIS — Z510 Encounter for antineoplastic radiation therapy: Secondary | ICD-10-CM | POA: Diagnosis not present

## 2019-05-29 ENCOUNTER — Ambulatory Visit
Admission: RE | Admit: 2019-05-29 | Discharge: 2019-05-29 | Disposition: A | Discharge status: Home / Self Care | Payer: Medicare Other | Source: Ambulatory Visit | Attending: Radiation Oncology | Admitting: Radiation Oncology

## 2019-05-29 ENCOUNTER — Other Ambulatory Visit: Payer: Self-pay

## 2019-05-29 DIAGNOSIS — Z510 Encounter for antineoplastic radiation therapy: Secondary | ICD-10-CM | POA: Diagnosis not present

## 2019-05-30 ENCOUNTER — Other Ambulatory Visit: Payer: Self-pay

## 2019-05-30 ENCOUNTER — Ambulatory Visit
Admission: RE | Admit: 2019-05-30 | Discharge: 2019-05-30 | Disposition: A | Discharge status: Home / Self Care | Payer: Medicare Other | Source: Ambulatory Visit | Attending: Radiation Oncology | Admitting: Radiation Oncology

## 2019-05-30 DIAGNOSIS — Z510 Encounter for antineoplastic radiation therapy: Secondary | ICD-10-CM | POA: Diagnosis not present

## 2019-06-01 NOTE — Progress Notes (Signed)
HEMATOLOGY/ONCOLOGY CONSULTATION NOTE  Date of Service: 06/01/2019  Patient Care Team: Bartholome Bill, MD as PCP - General (Family Medicine) Eppie Gibson, MD as Attending Physician (Radiation Oncology) Leota Sauers, RN as Oncology Nurse Navigator Karie Mainland, RD as Dietitian (Nutrition) Jomarie Longs, PT (Inactive) as Physical Therapist (Physical Therapy) Kennith Center, LCSW as Social Worker [place], Perry Mount, CCC-SLP as Psychologist, clinical (Speech Pathology)  CHIEF COMPLAINTS/PURPOSE OF CONSULTATION:  Malignant Neoplasm of Maxillary Sinus [facility])  Oncologic Hx:  01/2017: Initial diagnosis of SCC. 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  Path showed invasive squamous cell carcinoma, 6.1 cm, moderately differentiated, non-keratinizing, with papillary and inverted growth pattern with negative margins.  08/15/2017: elected to forgo radiotherapy despite consensus recommendation   11/21/2018: s/p for left maxillary antrostomy and anterior ethmoidectomy with biopsy. Path: Frozen specimen revealed SCCA with an inverted type appearance.   01/17/2019: Seen by ENT (Dr. Charolett Bumpers)  01/28/2019: Medical Oncology referral  HISTORY OF PRESENTING ILLNESS:   Jodi [name] is a wonderful 58 y.o. female who has been transferred to [place] [facility]  for evaluation and management of Malignant neoplasm of maxillary sinus ([place]). Pt is accompanied today by her daughter [name] via phone. The pt reports that she is doing well overall.   When discussing her right side Endoscopic Sinus surgery pt reports that she hasn't noticed any symptoms on her right side since. She also reports that she began to experience symptoms on her left side about a year after her that surgery. With her first type of chemo treament  (carboplatin/taxol/cetuximab) the tumor shrunk about 90%. She did experience several rashes due to her chemotherapy, which have healed owning to Doxycycline. Pt also experienced frequent diarrhea because of her treatment. During treatment she took a magnesium supplement but was not required to have Neulasta shots.    Pt started on Cisplatin on 05/14/2019. She has only had one dose so far. She began radiation a week prior and that has been ongoing. Pt reports that they did not check her ears before beginning the Cisplatin but pt does not note any new issues except for her daughter reporting that she his having more difficulty with her hearing. Pt reports that she tolerated the first does of Cisplatin well but she did experience some numbness and tingling in her fingers which is slowly improving. She has been taking her chemo treatment through her veins and she does not want a port placed.   The pt reports that she is doing okay except for her mouth swelling, painful mouth sores and absence of taste. Her ability to eat has been severely decreased due to her mouth pain. She reports that she can only eat soup and other liquid foods but she is able to swallow. She does not have a feeding tube and does not want one. She has been using a Lidocaine mouthwash to help ease her mouth and throat pain. She feels that this is sufficient. Pt had many of her teeth removed after her first surgery and has discoloration in her mouth. She has been in contact with the nutritionist but reports that when given supplements like Boost or Ensure her BP spikes and she feels lightheaded. Pt reports that she is drinking a sufficient amount of water.   She is doing okay at home and currently  walks with a cane due to balance and joint issues. Pt states that joint pain, fatigue and SOB are all factors limiting her mobility. She has occasional numbness and tingling in her feet as well.  Pt is on Metformin for her diabetes, she has not had  to take insulin and reports that it is well controlled. Metformin has not appeared to change her appetite. She is taking her nausea medication as needed and which is helping.   Dr. Precious Haws is her PCP at this time and he is managing her medications for her Bipolar disorder.  Of note since the patient's last visit, pt has had CT Sinus w/contrast completed on 03/17/2019 with results revealing "- Marked interval decrease in size of a previously demonstrated large destructive tumor centered within the left maxillary sinus and hard palate. Persistent regions of soft tissue thickening, some nonspecific and some suspicious for residual tumor, as described. Most notably, soft tissue thickening along the anterior maxillary alveolar ridge and soft tissue fullness within the region of the left pterygoid musculature, is suspicious for residual tumor. - Stable asymmetrically prominent left level 1A and 1B lymph nodes measuring up to 1 cm in short axis. These nodes demonstrate FDG avidity on prior PET CT and findings are suspicious for nodal metastatic disease. No developing lymphadenopathy elsewhere within the neck. - 1.4 cm peripherally enhancing subcutaneous cystic lesion within the submental region, increased in size since prior PET CT and suspicious for possible tumor deposit."  Most recent lab results (01/23/2019) of CBC is as follows: all values are WNL except for Potassium at 3.1, Glucose at 146, BUN at 21, Calcium at 8.8, Albumin at 3.2, AST at 13.  On review of systems, pt reports mouth pain, mouth ulcers, nausea, numbness and tingling in her feet and fingers, fatigue, SOB, joint pain and denies worsening leg swelling, abdominal pain, diarrhea, change in vision, calf pain and any other symptoms.   On PMHx the pt reports NIDDM, HTN, CHF, Bipolar disorder.  MEDICAL HISTORY:  Past Medical History:  Diagnosis Date  . Anginal pain ([place])   . Atypical chest pain   . Bipolar disorder ([place])   . CHF  (congestive heart failure) ([place])   . Chronic back pain   . Chronic knee pain   . Cor pulmonale (chronic) ([place])   . Depression   . Diabetes mellitus    Type II  . Dyspnea    with fluid retention; with activity  . Headache   . Hyperlipemia   . Malignant neoplasm of maxillary sinus ([place]) 02/13/2017  . Moderate to severe pulmonary hypertension ([place])   . Morbid obesity ([place])   . Obesity hypoventilation syndrome ([place])   . On home oxygen therapy    PRN  . Pickwickian syndrome ([place])   . Thoracic aortic aneurysm [facility])     SURGICAL HISTORY: Past Surgical History:  Procedure Laterality Date  . RIGHT OOPHORECTOMY    . SINUS ENDO WITH FUSION N/A 02/13/2017   Procedure: ENDOSCOPIC SINUS ENDO WITH FUSION;  Surgeon: Rozetta Nunnery, MD;  Location: [facility] OR;  Service: ENT;  Laterality: N/A;  . SINUSOTOMY Right 02/13/2017   Procedure: [place];  Surgeon: Rozetta Nunnery, MD;  Location: [place];  Service: ENT;  Laterality: Right;  . TONSILLECTOMY      SOCIAL HISTORY: Social History   Socioeconomic History  . Marital status: Single    Spouse name: Not on file  . Number of children: 3  . Years of  education: Not on file  . Highest education level: Not on file  Occupational History  . Not on file  Social Needs  . Financial resource strain: Not on file  . Food insecurity    Worry: Not on file    Inability: Not on file  . Transportation needs    Medical: No    Non-medical: No  Tobacco Use  . Smoking status: Former Smoker    Quit date: 09/19/2003    Years since quitting: 15.7  . Smokeless tobacco: Never Used  . Tobacco comment: greater than 14 years ago  Substance and Sexual Activity  . Alcohol use: Yes    Alcohol/week: 0.0 standard drinks    Comment: rarely  . Drug use: Never  . Sexual activity: Not on file  Lifestyle  . Physical activity    Days per week: Not on file    Minutes per session: Not on file  . Stress: Not on file  Relationships  . Social  Herbalist on phone: Not on file    Gets together: Not on file    Attends religious service: Not on file    Active member of club or organization: Not on file    Attends meetings of clubs or organizations: Not on file    Relationship status: Not on file  . Intimate partner violence    Fear of current or ex partner: No    Emotionally abused: No    Physically abused: No    Forced sexual activity: No  Other Topics Concern  . Not on file  Social History Narrative  . Not on file    FAMILY HISTORY: Family History  Problem Relation Age of Onset  . Diabetes Other     ALLERGIES:  is allergic to penicillins.  MEDICATIONS:  Current Outpatient Medications  Medication Sig Dispense Refill  . acetaminophen (TYLENOL 8 HOUR) 650 MG CR tablet Take 1 tablet (650 mg total) by mouth every 8 (eight) hours as needed for pain. 30 tablet 0  . ARIPiprazole (ABILIFY) 15 MG tablet Take 15 mg by mouth at bedtime.    [name] Dextran 70-Hypromellose (ARTIFICIAL TEARS) 0.1-0.3 % SOLN 1 drop 4 (four) times a day as needed (dry eyes).    [name] divalproex (DEPAKOTE) 500 MG DR tablet Take 500 mg by mouth at bedtime.    . furosemide (LASIX) 40 MG tablet Take 2 tablets (80 mg total) by mouth 2 (two) times daily. 120 tablet 2  . lidocaine (XYLOCAINE) 2 % solution Patient: Mix 1part 2% viscous lidocaine, 1part H20. Swish & swallow 34mL of diluted mixture, 31min before meals and at bedtime, up to QID 100 mL 5  . magnesium oxide (MAG-OX) 400 MG tablet Take by mouth.    . metFORMIN (GLUCOPHAGE) 500 MG tablet Take 500 mg by mouth 2 (two) times daily with a meal.    . ondansetron (ZOFRAN) 4 MG tablet Take 1 tablet (4 mg total) by mouth every 8 (eight) hours as needed for nausea or vomiting. (Patient not taking: Reported on 06/29/2017) 12 tablet 0  . potassium chloride SA (K-DUR,KLOR-CON) 20 MEQ tablet Take 1 tablet (20 mEq total) by mouth 2 (two) times daily. 30 tablet 1  . sodium fluoride (PREVIDENT 5000 PLUS) 1.1 % CREA  dental cream Apply to tooth brush. Brush teeth for 2 minutes. Spit out excess-DO NOT swallow. Repeat nightly. (Patient not taking: Reported on 11/08/2018) 1 Tube prn   No current facility-administered medications for this visit.  REVIEW OF SYSTEMS:    10 Point review of Systems was done is negative except as noted above.  PHYSICAL EXAMINATION: ECOG PERFORMANCE STATUS: 2 - Symptomatic, <50% confined to bed  . Vitals:   06/02/19 1333  BP: 108/64  Pulse: 70  Resp: 17  Temp: 98 F (36.7 C)  SpO2: 95%   Filed Weights   06/02/19 1333  Weight: (!) 312 lb 3.2 oz (141.6 kg)   .Body mass index is 53.59 kg/m.   GENERAL:alert, in no acute distress and comfortable SKIN: no acute rashes, no significant lesions EYES: conjunctiva are pink and non-injected, sclera anicteric OROPHARYNX: MMM, no exudates. Radiation mucositis Grade 2 on the hard and soft palette, no thrush. NECK: supple, no JVD LYMPH:  no palpable lymphadenopathy in the cervical, axillary or inguinal regions LUNGS: clear to auscultation b/l with normal respiratory effort HEART: regular rate & rhythm ABDOMEN:  normoactive bowel sounds , non tender, not distended. Extremity: 1+ pedal edema PSYCH: alert & oriented x 3 with fluent speech NEURO: no focal motor/sensory deficits  LABORATORY DATA:  I have reviewed the data as listed  . CBC Latest Ref Rng & Units 06/03/2019 01/23/2019 11/08/2018  WBC 4.0 - 10.5 K/uL 5.3 7.1 9.0  Hemoglobin 12.0 - 15.0 g/dL 12.8 12.6 12.4  Hematocrit 36.0 - 46.0 % 39.2 41.1 41.7  Platelets 150 - 400 K/uL 182 190 161    . CMP Latest Ref Rng & Units 06/03/2019 05/02/2019 04/30/2019  Glucose 70 - 99 mg/dL 112(H) - -  BUN 6 - 20 mg/dL 16 18 17   Creatinine 0.44 - 1.00 mg/dL 0.97 0.79 0.86  Sodium 135 - 145 mmol/L 142 - -  Potassium 3.5 - 5.1 mmol/L 3.0(LL) - -  Chloride 98 - 111 mmol/L 99 - -  CO2 22 - 32 mmol/L 35(H) - -  Calcium 8.9 - 10.3 mg/dL 8.9 - -  Total Protein 6.5 - 8.1 g/dL 7.2 - -   Total Bilirubin 0.3 - 1.2 mg/dL 0.5 - -  Alkaline Phos 38 - 126 U/L 74 - -  AST 15 - 41 U/L 15 - -  ALT 0 - 44 U/L 14 - -   11/21/2018 Surgical Pathology Exam   RADIOGRAPHIC STUDIES: I have personally reviewed the radiological images as listed and agreed with the findings in the report. No results found.  ASSESSMENT & PLAN:   1) Left maxillary sinus Squamous cell carcinoma  s/p for left maxillary antrostomy and anterior ethmoidectomy with biopsy. Path: Frozen specimen revealed SCCA with an inverted type appearance.  S/p [place] regimen carboplatin/taxol/cetuximab with good response. Currently on Concurrent Cisplatin / RT   2) h/o rt maxillary sinus squamous cell carcinoma 01/2017: Initial diagnosis of SCC. 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  Path showed invasive squamous cell carcinoma, 6.1 cm, moderately differentiated, non-keratinizing, with papillary and inverted growth pattern with negative margins.  08/15/2017: elected to forgo radiotherapy despite consensus recommendation  PLAN  -Discussed patient's most recent labs from 01/23/2019, all values are WNL except for Potassium at 3.1, Glucose at 146, BUN at 21, Calcium at 8.8, Albumin at 3.2, AST at 13. -discussed her diagnosis and treatment history thus far. -Discussed incidental radiation and radiation field are causing her mouth ulcers and soreness -Discussed the role of chemotherapy in making radiation more effective, potentially curative -Advised pt that stopping treatment before the end of this cycle  is not advisable  -Discussed the importance of fluid management during Cisplatin treatment due to pt's CHF. Her lasix dose has been reduced already and we have reduced her pre and post hydration. -Discussed the importance of pt nutrition . She currently is taking adequate po intake. She does  not have a feeding tube in situ and does not want one. -Pt has no prohibitive toxicities from continuing Cisplatin weekly low dose Cisplatin 40mg /m2 at this time concurrent with RT. She has missed a few doses due to non compliant f/u at [place] for weekly Cisplatin alongside current radiation schedule -Will get labs tomorrow  FOLLOW UP: Labs tomorrow Schedule to start weekly Cisplatin with labs RTC with MD with 2nd every other dose of Cisplatin   All of the patients questions were answered with apparent satisfaction. The patient knows to call the clinic with any problems, questions or concerns.  I spent 62mins counseling the patient face to face. The total time spent in the appointment was 65 minutes and more than 50% was on counseling and direct patient cares.    Sullivan Lone MD [place] AAHIVMS [facility] Hematology/Oncology Physician [facility]  (Office):       [phone] (Work cell):  [phone] (Fax):           [phone]  06/01/2019 11:35 PM   I, Yevette Edwards, am acting as a scribe for Dr. Sullivan Lone.   .I have reviewed the above documentation for accuracy and completeness, and I agree with the above. Brunetta Genera MD

## 2019-06-02 ENCOUNTER — Other Ambulatory Visit: Payer: Self-pay

## 2019-06-02 ENCOUNTER — Encounter: Payer: Self-pay | Admitting: *Deleted

## 2019-06-02 ENCOUNTER — Telehealth: Payer: Self-pay | Admitting: Hematology

## 2019-06-02 ENCOUNTER — Ambulatory Visit
Admission: RE | Admit: 2019-06-02 | Discharge: 2019-06-02 | Disposition: A | Discharge status: Home / Self Care | Payer: Medicare Other | Source: Ambulatory Visit | Attending: Radiation Oncology | Admitting: Radiation Oncology

## 2019-06-02 ENCOUNTER — Inpatient Hospital Stay (HOSPITAL_BASED_OUTPATIENT_CLINIC_OR_DEPARTMENT_OTHER): Payer: Medicare Other | Admitting: Hematology

## 2019-06-02 VITALS — BP 108/64 | HR 70 | Temp 98.0°F | Resp 17 | Ht 64.0 in | Wt 312.2 lb

## 2019-06-02 DIAGNOSIS — Z510 Encounter for antineoplastic radiation therapy: Secondary | ICD-10-CM | POA: Diagnosis not present

## 2019-06-02 DIAGNOSIS — R197 Diarrhea, unspecified: Secondary | ICD-10-CM

## 2019-06-02 DIAGNOSIS — C310 Malignant neoplasm of maxillary sinus: Secondary | ICD-10-CM | POA: Diagnosis present

## 2019-06-02 DIAGNOSIS — E119 Type 2 diabetes mellitus without complications: Secondary | ICD-10-CM

## 2019-06-02 DIAGNOSIS — Z5111 Encounter for antineoplastic chemotherapy: Secondary | ICD-10-CM

## 2019-06-02 DIAGNOSIS — I10 Essential (primary) hypertension: Secondary | ICD-10-CM

## 2019-06-02 DIAGNOSIS — I509 Heart failure, unspecified: Secondary | ICD-10-CM | POA: Diagnosis not present

## 2019-06-02 DIAGNOSIS — Z7189 Other specified counseling: Secondary | ICD-10-CM

## 2019-06-02 NOTE — Telephone Encounter (Addendum)
Scheduled appt per 9/14 los.  Spoke with patient and she is aware of her appt date and time.  Patient stated she will stop by scheduling to get a print out of her appts.  No current treatment plan is active at the moment,

## 2019-06-02 NOTE — Telephone Encounter (Deleted)
No current treatment plan is active at the moment.

## 2019-06-03 ENCOUNTER — Other Ambulatory Visit: Payer: Self-pay | Admitting: *Deleted

## 2019-06-03 ENCOUNTER — Telehealth: Payer: Self-pay | Admitting: *Deleted

## 2019-06-03 ENCOUNTER — Inpatient Hospital Stay: Payer: Medicare Other

## 2019-06-03 ENCOUNTER — Other Ambulatory Visit: Payer: Self-pay

## 2019-06-03 ENCOUNTER — Other Ambulatory Visit: Payer: Self-pay | Admitting: Hematology

## 2019-06-03 ENCOUNTER — Ambulatory Visit
Admission: RE | Admit: 2019-06-03 | Discharge: 2019-06-03 | Disposition: A | Discharge status: Home / Self Care | Payer: Medicare Other | Source: Ambulatory Visit | Attending: Radiation Oncology | Admitting: Radiation Oncology

## 2019-06-03 DIAGNOSIS — C310 Malignant neoplasm of maxillary sinus: Secondary | ICD-10-CM | POA: Diagnosis not present

## 2019-06-03 DIAGNOSIS — Z510 Encounter for antineoplastic radiation therapy: Secondary | ICD-10-CM | POA: Diagnosis not present

## 2019-06-03 LAB — CMP (CANCER CENTER ONLY)
ALT: 14 U/L (ref 0–44)
AST: 15 U/L (ref 15–41)
Albumin: 3.5 g/dL (ref 3.5–5.0)
Alkaline Phosphatase: 74 U/L (ref 38–126)
Anion gap: 8 (ref 5–15)
BUN: 16 mg/dL (ref 6–20)
CO2: 35 mmol/L — ABNORMAL HIGH (ref 22–32)
Calcium: 8.9 mg/dL (ref 8.9–10.3)
Chloride: 99 mmol/L (ref 98–111)
Creatinine: 0.97 mg/dL (ref 0.44–1.00)
GFR, Est AFR Am: >60 mL/min (ref >60)
GFR, Estimated: >60 mL/min (ref >60)
Glucose, Bld: 112 mg/dL — ABNORMAL HIGH (ref 70–99)
Potassium: 3 mmol/L — CL (ref 3.5–5.1)
Sodium: 142 mmol/L (ref 135–145)
Total Bilirubin: 0.5 mg/dL (ref 0.3–1.2)
Total Protein: 7.2 g/dL (ref 6.5–8.1)

## 2019-06-03 LAB — PHOSPHORUS: Phosphorus: 3.8 mg/dL (ref 2.5–4.6)

## 2019-06-03 LAB — CBC WITH DIFFERENTIAL/PLATELET
Abs Immature Granulocytes: 0 10*3/uL (ref 0.00–0.07)
Basophils Absolute: 0 10*3/uL (ref 0.0–0.1)
Basophils Relative: 0 %
Eosinophils Absolute: 0.1 10*3/uL (ref 0.0–0.5)
Eosinophils Relative: 1 %
HCT: 39.2 % (ref 36.0–46.0)
Hemoglobin: 12.8 g/dL (ref 12.0–15.0)
Immature Granulocytes: 0 %
Lymphocytes Relative: 7 %
Lymphs Abs: 0.4 10*3/uL — ABNORMAL LOW (ref 0.7–4.0)
MCH: 32.4 pg (ref 26.0–34.0)
MCHC: 32.7 g/dL (ref 30.0–36.0)
MCV: 99.2 fL (ref 80.0–100.0)
Monocytes Absolute: 0.6 10*3/uL (ref 0.1–1.0)
Monocytes Relative: 10 %
Neutro Abs: 4.3 10*3/uL (ref 1.7–7.7)
Neutrophils Relative %: 82 %
Platelets: 182 10*3/uL (ref 150–400)
RBC: 3.95 MIL/uL (ref 3.87–5.11)
RDW: 12.9 % (ref 11.5–15.5)
WBC: 5.3 10*3/uL (ref 4.0–10.5)
nRBC: 0 % (ref 0.0–0.2)

## 2019-06-03 LAB — MAGNESIUM: Magnesium: 1.5 mg/dL — ABNORMAL LOW (ref 1.7–2.4)

## 2019-06-03 MED ORDER — POTASSIUM CHLORIDE CRYS ER 20 MEQ PO TBCR: 20.0000 meq | EXTENDED_RELEASE_TABLET | Freq: Two times a day (BID) | ORAL | 0 refills | Status: DC

## 2019-06-03 NOTE — Telephone Encounter (Signed)
Contacted patient at Dr. Grier Mitts request - potassium 3.0 in today's lab test. Potassium tablets escribed to patient's pharmacy: 20 meQ, 2 times a day, #60/0 R. Patient verbalized understanding for need for medication.

## 2019-06-03 NOTE — Telephone Encounter (Signed)
CRITICAL VALUE: Potassium DATE & TIME NOTIFIED: 06/03/2019, 1608 MESSENGER (representative from lab):Koshani MD NOTIFIED: Dr.Kale TIME OF NOTIFICATION: 1618 RESPONSE: Potassium po ordered

## 2019-06-04 ENCOUNTER — Other Ambulatory Visit: Payer: Self-pay | Admitting: Hematology

## 2019-06-04 ENCOUNTER — Ambulatory Visit
Admission: RE | Admit: 2019-06-04 | Discharge: 2019-06-04 | Disposition: A | Discharge status: Home / Self Care | Payer: Medicare Other | Source: Ambulatory Visit | Attending: Radiation Oncology | Admitting: Radiation Oncology

## 2019-06-04 ENCOUNTER — Other Ambulatory Visit: Payer: Self-pay

## 2019-06-04 ENCOUNTER — Inpatient Hospital Stay: Payer: Medicare Other

## 2019-06-04 DIAGNOSIS — Z510 Encounter for antineoplastic radiation therapy: Secondary | ICD-10-CM | POA: Diagnosis not present

## 2019-06-04 NOTE — Progress Notes (Signed)
Nutrition  Patient did not show up for nutrition follow-up today after radiation.   [name] Jodi [name], [place], [place] Registered Dietitian [phone] (pager)

## 2019-06-05 ENCOUNTER — Other Ambulatory Visit: Payer: Self-pay

## 2019-06-05 ENCOUNTER — Ambulatory Visit
Admission: RE | Admit: 2019-06-05 | Discharge: 2019-06-05 | Disposition: A | Discharge status: Home / Self Care | Payer: Medicare Other | Source: Ambulatory Visit | Attending: Radiation Oncology | Admitting: Radiation Oncology

## 2019-06-05 ENCOUNTER — Other Ambulatory Visit: Payer: Self-pay | Admitting: Hematology

## 2019-06-05 DIAGNOSIS — Z510 Encounter for antineoplastic radiation therapy: Secondary | ICD-10-CM | POA: Diagnosis not present

## 2019-06-05 DIAGNOSIS — Z7189 Other specified counseling: Secondary | ICD-10-CM | POA: Insufficient documentation

## 2019-06-05 DIAGNOSIS — C310 Malignant neoplasm of maxillary sinus: Secondary | ICD-10-CM

## 2019-06-05 MED ORDER — LIDOCAINE-PRILOCAINE 2.5-2.5 % EX CREA: TOPICAL_CREAM | CUTANEOUS | 3 refills | Status: DC

## 2019-06-05 MED ORDER — PROCHLORPERAZINE MALEATE 10 MG PO TABS: 10.0000 mg | ORAL_TABLET | Freq: Four times a day (QID) | ORAL | 1 refills | Status: DC | PRN

## 2019-06-05 MED ORDER — DEXAMETHASONE 4 MG PO TABS: ORAL_TABLET | ORAL | 1 refills | Status: DC

## 2019-06-05 MED ORDER — ONDANSETRON HCL 8 MG PO TABS: 8.0000 mg | ORAL_TABLET | Freq: Two times a day (BID) | ORAL | 1 refills | Status: DC | PRN

## 2019-06-05 NOTE — Progress Notes (Signed)
START ON PATHWAY REGIMEN - Head and Neck     A cycle is every 7 days:     Cisplatin   **Always confirm dose/schedule in your pharmacy ordering system**  Patient Characteristics: Other Sites, Stage III, IVA, IVB; Unresectable Disease Classification: Other Sites AJCC 8 Stage Grouping: Unknown Current Disease Status: No Distant Metastases and No Recurrent Disease Intent of Therapy: Curative Intent, Discussed with Patient

## 2019-06-06 ENCOUNTER — Other Ambulatory Visit: Payer: Self-pay

## 2019-06-06 ENCOUNTER — Ambulatory Visit
Admission: RE | Admit: 2019-06-06 | Discharge: 2019-06-06 | Disposition: A | Discharge status: Home / Self Care | Payer: Medicare Other | Source: Ambulatory Visit | Attending: Radiation Oncology | Admitting: Radiation Oncology

## 2019-06-06 DIAGNOSIS — Z510 Encounter for antineoplastic radiation therapy: Secondary | ICD-10-CM | POA: Diagnosis not present

## 2019-06-09 ENCOUNTER — Other Ambulatory Visit: Payer: Self-pay | Admitting: Radiation Oncology

## 2019-06-09 ENCOUNTER — Other Ambulatory Visit: Payer: Self-pay

## 2019-06-09 ENCOUNTER — Ambulatory Visit
Admission: RE | Admit: 2019-06-09 | Discharge: 2019-06-09 | Disposition: A | Discharge status: Home / Self Care | Payer: Medicare Other | Source: Ambulatory Visit | Attending: Radiation Oncology | Admitting: Radiation Oncology

## 2019-06-09 DIAGNOSIS — C310 Malignant neoplasm of maxillary sinus: Secondary | ICD-10-CM

## 2019-06-09 DIAGNOSIS — Z510 Encounter for antineoplastic radiation therapy: Secondary | ICD-10-CM | POA: Diagnosis not present

## 2019-06-09 MED ORDER — HYDROCODONE-ACETAMINOPHEN 7.5-325 MG/15ML PO SOLN: 10.0000 mL | ORAL | 0 refills | Status: DC | PRN

## 2019-06-09 MED ORDER — DOCUSATE SODIUM 100 MG PO CAPS: 200.0000 mg | ORAL_CAPSULE | Freq: Every day | ORAL | 0 refills | Status: DC | PRN

## 2019-06-09 NOTE — Progress Notes (Signed)
Oncology Nurse Navigator Documentation  To provide support, encouragement and care continuity, met with [name] during her initial consult with Dr. Irene Limbo.  She arrived by [facility].  Her dtr Jodi [name] joined her by phone. They voiced understanding of plan to continue the weekly LD Cisplatin infusions initiated at [facility]. They voiced understanding infusions will be coordinated with XRT appts. Following appt, I escorted her to [facility] for scheduled RT. I encouraged her to call me with questions/concerns as she continues with treatments.  She voiced agreement.  [name], RN, BSN Head & Neck Oncology Nurse [place] at [place] [phone]

## 2019-06-10 ENCOUNTER — Inpatient Hospital Stay: Payer: Medicare Other

## 2019-06-10 ENCOUNTER — Other Ambulatory Visit: Payer: Self-pay

## 2019-06-10 ENCOUNTER — Ambulatory Visit
Admission: RE | Admit: 2019-06-10 | Discharge: 2019-06-10 | Disposition: A | Discharge status: Home / Self Care | Payer: Medicare Other | Source: Ambulatory Visit | Attending: Radiation Oncology | Admitting: Radiation Oncology

## 2019-06-10 DIAGNOSIS — Z510 Encounter for antineoplastic radiation therapy: Secondary | ICD-10-CM | POA: Diagnosis not present

## 2019-06-10 NOTE — Progress Notes (Signed)
Nutrition  RD planning nutrition follow-up during infusion this am.  Patient was a no show for infusion.  Will plan follow-up during infusion on 9/29.   Euphemia Lingerfelt B. Zenia Resides, [place], [place] Registered Dietitian [phone] (pager)

## 2019-06-11 ENCOUNTER — Ambulatory Visit
Admission: RE | Admit: 2019-06-11 | Discharge: 2019-06-11 | Disposition: A | Discharge status: Home / Self Care | Payer: Medicare Other | Source: Ambulatory Visit | Attending: Radiation Oncology | Admitting: Radiation Oncology

## 2019-06-11 ENCOUNTER — Other Ambulatory Visit: Payer: Self-pay

## 2019-06-11 DIAGNOSIS — Z510 Encounter for antineoplastic radiation therapy: Secondary | ICD-10-CM | POA: Diagnosis not present

## 2019-06-12 ENCOUNTER — Other Ambulatory Visit: Payer: Self-pay

## 2019-06-12 ENCOUNTER — Ambulatory Visit
Admission: RE | Admit: 2019-06-12 | Discharge: 2019-06-12 | Disposition: A | Discharge status: Home / Self Care | Payer: Medicare Other | Source: Ambulatory Visit | Attending: Radiation Oncology | Admitting: Radiation Oncology

## 2019-06-12 DIAGNOSIS — Z510 Encounter for antineoplastic radiation therapy: Secondary | ICD-10-CM | POA: Diagnosis not present

## 2019-06-13 ENCOUNTER — Other Ambulatory Visit: Payer: Self-pay

## 2019-06-13 ENCOUNTER — Ambulatory Visit
Admission: RE | Admit: 2019-06-13 | Discharge: 2019-06-13 | Disposition: A | Discharge status: Home / Self Care | Payer: Medicare Other | Source: Ambulatory Visit | Attending: Radiation Oncology | Admitting: Radiation Oncology

## 2019-06-13 DIAGNOSIS — Z510 Encounter for antineoplastic radiation therapy: Secondary | ICD-10-CM | POA: Diagnosis not present

## 2019-06-16 ENCOUNTER — Ambulatory Visit
Admission: RE | Admit: 2019-06-16 | Discharge: 2019-06-16 | Disposition: A | Discharge status: Home / Self Care | Payer: Medicare Other | Source: Ambulatory Visit | Attending: Radiation Oncology | Admitting: Radiation Oncology

## 2019-06-16 ENCOUNTER — Other Ambulatory Visit: Payer: Self-pay

## 2019-06-16 DIAGNOSIS — Z510 Encounter for antineoplastic radiation therapy: Secondary | ICD-10-CM | POA: Diagnosis not present

## 2019-06-17 ENCOUNTER — Ambulatory Visit: Payer: Medicare Other

## 2019-06-17 ENCOUNTER — Telehealth: Payer: Self-pay

## 2019-06-17 ENCOUNTER — Inpatient Hospital Stay: Payer: Medicare Other

## 2019-06-17 ENCOUNTER — Telehealth: Payer: Self-pay | Admitting: *Deleted

## 2019-06-17 ENCOUNTER — Inpatient Hospital Stay: Payer: Medicare Other | Admitting: Hematology

## 2019-06-17 NOTE — Telephone Encounter (Signed)
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  Patient receiving radiation and chemotherapy.  Patient was a no show for infusion today and RD unable to see patient.    RD called patient for nutrition follow-up as was no show for nutrition on 9/16, no show for infusion (RD planning to see) on 9/22 as well.  Patient reports that her tongue is sore and she is not able to eat very much.  Reports that she is only eating grits, soups and drinking gingerale, water and coffee.  She does not want to drink oral nutrition supplements for fear of blood glucose going up. Patient tearful during conversation and asking if Dr. Isidore Moos can call her.  Patient reports that she is not sure that she wants to continue treatments.      Medications: reviewed  Labs: reviewed  Anthropometrics:   Last weight in chart 9/14 312 lb 3.2 oz.  Patient reports that she thinks she weighed 302 or 303 lb yesterday at radiation.     NUTRITION DIAGNOSIS: Predicted suboptimal intake continues    INTERVENTION:  Discussed blending foods for ease of chewing. Discussed benefits of oral nutrition supplements and blood glucose can be effected by numerous factors.   Message sent to nurse navigator and Dr. Isidore Moos at patient's request.   Patient did not want to schedule follow-up appointment with RD at this time.     MONITORING, EVALUATION, GOAL: plan of care, weight trends, intake   NEXT VISIT: patient declined to schedule follow-up at this time  Jakeria Caissie B. Zenia Resides, [place], [place] Registered Dietitian [phone] (pager)

## 2019-06-17 NOTE — Telephone Encounter (Addendum)
Oncology Nurse Navigator Documentation  Called Ms Jodi [name] in follow-up to [place] for RT and chemo today and her VMM indicating concerns. She indicated she does not want to continue with chemotherapy but wants to finish RT. She reported:  Tongue pain which makes it difficult to eat/drink.  Using HYCET TID 10 mL dose, lidocaine solution.  Fatigue/weakness.  Dry nostrils with occasional scant nosebleeds.  Can't brush teeth or tongue d/t astringency, "toothpaste burns my gums and tongue". I explained:  Symptoms she is experiencing are not unusual at this point in her tmt program.  Indicated she has only 11 XRT remaining, only 2 or 3 chemo tmts.   Reminded her optimal tmt benefit comes when chemo is included in tmt plan with RT being the cornerstone.  Drs. Irene Limbo and Isidore Moos can address SEs and adjust Rx as needed if current medications inadequate.  Important to discuss discontinuation of tmts with physicians rather than not showing up for appts to provide them opportunity to address her concerns/needs. I suggested:  Increasing HYCET to 15 mL dosage up to q4 hrs per Rx, continued use of lidocaine per Rx.  Use of humidifier to relieve nasal dryness during day as able but especially at HS.  She confirmed she has one but doesn't use.   Use of soft bristle toothbrush, suggested a) asking pharmacist to recommend toothpaste, b) try baking soda as alternative to toothpaste. I encouraged her to:  Continue with scheduled RT.  Arrive for appt with Dr. Irene Limbo next Tuesday to share her concerns/questions re continuing with systemic tmt. Call me as needed, particularly if she is not coming in for appts.  She agreed.  Note shared with Drs. Isidore Moos and [place].  [name], RN, BSN Head & Neck Oncology Nurse [place] at [place] [phone]

## 2019-06-17 NOTE — Telephone Encounter (Signed)
Contacted patient this morning after patient did not come to lab appt or MD appt. Patient stated she did not want to come today. Appts for today cancelled. Encouraged patient to come to appts on  10/6. Patient stated she wanted to talk with Dr.Kale, asked for him to get message to call her - stating she was not sure she wanted to have chemotherapy. Dr.Kale given message.

## 2019-06-18 ENCOUNTER — Other Ambulatory Visit: Payer: Self-pay

## 2019-06-18 ENCOUNTER — Ambulatory Visit
Admission: RE | Admit: 2019-06-18 | Discharge: 2019-06-18 | Disposition: A | Discharge status: Home / Self Care | Payer: Medicare Other | Source: Ambulatory Visit | Attending: Radiation Oncology | Admitting: Radiation Oncology

## 2019-06-18 DIAGNOSIS — Z510 Encounter for antineoplastic radiation therapy: Secondary | ICD-10-CM | POA: Diagnosis not present

## 2019-06-19 ENCOUNTER — Ambulatory Visit: Payer: Medicare Other

## 2019-06-19 DIAGNOSIS — Z510 Encounter for antineoplastic radiation therapy: Secondary | ICD-10-CM | POA: Insufficient documentation

## 2019-06-19 DIAGNOSIS — C310 Malignant neoplasm of maxillary sinus: Secondary | ICD-10-CM | POA: Insufficient documentation

## 2019-06-20 ENCOUNTER — Telehealth: Payer: Self-pay | Admitting: *Deleted

## 2019-06-20 ENCOUNTER — Ambulatory Visit: Payer: Medicare Other

## 2019-06-20 NOTE — Telephone Encounter (Signed)
Oncology Nurse Navigator Documentation  In follow-up to notification by LINAC 2 RTT Ms. Ganim was [place], called her to check on her well-being. She indicated she has decided to discontinue RT, "I'm in too much pain." Unclear if she had increased HYCET per our conversation on Tuesday. I encouraged her to come in this afternoon to minimally see Dr. Isidore Moos.  She refused, stated she would talk to Dr. Isidore Moos over the phone. I encouraged her come in Monday after a weekend of rest to meet with Dr. Isidore Moos.  She was non-comittal.  Dr. Isidore Moos informed.  [name], RN, BSN Head & Neck Oncology Nurse [place] at [place] [phone]

## 2019-06-23 ENCOUNTER — Ambulatory Visit: Payer: Medicare Other

## 2019-06-23 ENCOUNTER — Telehealth: Payer: Self-pay | Admitting: *Deleted

## 2019-06-23 NOTE — Telephone Encounter (Signed)
Oncology Nurse Navigator Documentation  Returned call to Ms. Docter, addressed her concern re skin desquamation.   She indicated she does not plan to arrive for RT today, again, was adamant she does not plan to continue with chemotherapy .  I encouraged her to at least come in so she can be seen by Dr. Isidore Moos and discuss further RT tmt.  She hesitatingly agreed.   I indicated we can arrange transportation if she is unable to find someone to bring her in (dtr is "having car problems").  She agreed to call me to confirm whether or not she needs a ride.  [name], RN, BSN Head & Neck Oncology Nurse [place] at [place] [phone]

## 2019-06-24 ENCOUNTER — Ambulatory Visit: Admission: RE | Admit: 2019-06-24 | Payer: Medicare Other | Source: Ambulatory Visit

## 2019-06-24 ENCOUNTER — Other Ambulatory Visit: Payer: Self-pay | Admitting: Hematology

## 2019-06-24 ENCOUNTER — Ambulatory Visit
Admission: RE | Admit: 2019-06-24 | Discharge: 2019-06-24 | Disposition: A | Discharge status: Home / Self Care | Payer: Medicare Other | Source: Ambulatory Visit | Attending: Radiation Oncology | Admitting: Radiation Oncology

## 2019-06-24 ENCOUNTER — Inpatient Hospital Stay: Payer: Medicare Other | Admitting: *Deleted

## 2019-06-24 ENCOUNTER — Telehealth: Payer: Self-pay

## 2019-06-24 ENCOUNTER — Inpatient Hospital Stay: Payer: Medicare Other

## 2019-06-24 ENCOUNTER — Other Ambulatory Visit: Payer: Self-pay

## 2019-06-24 ENCOUNTER — Inpatient Hospital Stay: Payer: Medicare Other | Admitting: Hematology

## 2019-06-24 DIAGNOSIS — Z5111 Encounter for antineoplastic chemotherapy: Secondary | ICD-10-CM

## 2019-06-24 DIAGNOSIS — Z510 Encounter for antineoplastic radiation therapy: Secondary | ICD-10-CM | POA: Diagnosis not present

## 2019-06-24 DIAGNOSIS — Z7189 Other specified counseling: Secondary | ICD-10-CM

## 2019-06-24 DIAGNOSIS — C310 Malignant neoplasm of maxillary sinus: Secondary | ICD-10-CM

## 2019-06-24 LAB — CMP (CANCER CENTER ONLY)
ALT: 11 U/L (ref 0–44)
AST: 17 U/L (ref 15–41)
Albumin: 3.4 g/dL — ABNORMAL LOW (ref 3.5–5.0)
Alkaline Phosphatase: 62 U/L (ref 38–126)
Anion gap: 13 (ref 5–15)
BUN: 17 mg/dL (ref 6–20)
CO2: 32 mmol/L (ref 22–32)
Calcium: 9.4 mg/dL (ref 8.9–10.3)
Chloride: 94 mmol/L — ABNORMAL LOW (ref 98–111)
Creatinine: 0.83 mg/dL (ref 0.44–1.00)
GFR, Est AFR Am: >60 mL/min (ref >60)
GFR, Estimated: >60 mL/min (ref >60)
Glucose, Bld: 107 mg/dL — ABNORMAL HIGH (ref 70–99)
Potassium: 2.6 mmol/L — CL (ref 3.5–5.1)
Sodium: 139 mmol/L (ref 135–145)
Total Bilirubin: 0.6 mg/dL (ref 0.3–1.2)
Total Protein: 7.7 g/dL (ref 6.5–8.1)

## 2019-06-24 LAB — CBC WITH DIFFERENTIAL/PLATELET
Abs Immature Granulocytes: 0.01 10*3/uL (ref 0.00–0.07)
Basophils Absolute: 0 10*3/uL (ref 0.0–0.1)
Basophils Relative: 0 %
Eosinophils Absolute: 0 10*3/uL (ref 0.0–0.5)
Eosinophils Relative: 1 %
HCT: 39.7 % (ref 36.0–46.0)
Hemoglobin: 13 g/dL (ref 12.0–15.0)
Immature Granulocytes: 0 %
Lymphocytes Relative: 5 %
Lymphs Abs: 0.3 10*3/uL — ABNORMAL LOW (ref 0.7–4.0)
MCH: 32.3 pg (ref 26.0–34.0)
MCHC: 32.7 g/dL (ref 30.0–36.0)
MCV: 98.5 fL (ref 80.0–100.0)
Monocytes Absolute: 0.5 10*3/uL (ref 0.1–1.0)
Monocytes Relative: 9 %
Neutro Abs: 4.5 10*3/uL (ref 1.7–7.7)
Neutrophils Relative %: 85 %
Platelets: 217 10*3/uL (ref 150–400)
RBC: 4.03 MIL/uL (ref 3.87–5.11)
RDW: 12.6 % (ref 11.5–15.5)
WBC: 5.3 10*3/uL (ref 4.0–10.5)
nRBC: 0 % (ref 0.0–0.2)

## 2019-06-24 LAB — MAGNESIUM: Magnesium: 1.8 mg/dL (ref 1.7–2.4)

## 2019-06-24 MED ORDER — POTASSIUM CHLORIDE CRYS ER 20 MEQ PO TBCR: EXTENDED_RELEASE_TABLET | ORAL | 0 refills | Status: DC

## 2019-06-24 NOTE — Telephone Encounter (Signed)
Radiation Oncology received a critical potassium level of 2.6 on labs drawn this afternoon on Ms. Binion. I informed Dr. Isidore Moos and she requested I call Sandi Mealy PA in symptom management for advice. I spoke to [place] and he recommended she present to the Emergency Dept for IV potassium. I also spoke to Dr. Grier Mitts nurse and Dr. Irene Limbo (her medical oncologist) agreed with that recommendation. I called and spoke to Ms. Wisler' daughter [name] and informed her of the above recommendations. Ms. Rascon does not drive and [name] is not able to take her until later this evening. Ms. Biesecker did take 40 meq po of her prescribed KLOR_CON this afternoon when informed of the lab result. She has not been taking the medicine regularly due to swallowing difficulties. I encouraged her daughter to have her take it as prescribed and a new prescription was sent to her pharmacy by Dr. Irene Limbo. Adylyn Heiting assures me she will be taking Ms. Grigorian to the Emergency Room this evening. I also left a voice mail with Ms. Sutcliffe informing her of her critical potassium, and the need to go to the Emergency Dept today.

## 2019-06-25 ENCOUNTER — Ambulatory Visit: Payer: Medicare Other

## 2019-06-26 ENCOUNTER — Ambulatory Visit: Payer: Medicare Other

## 2019-06-27 ENCOUNTER — Telehealth: Payer: Self-pay

## 2019-06-27 ENCOUNTER — Ambulatory Visit: Payer: Medicare Other

## 2019-06-27 NOTE — Telephone Encounter (Signed)
I spoke with Ms. Alf' daughter [name] on the telephone. She stated that her mother declined to go to the Emergency Department as recommended by Dr. Isidore Moos and Dr. Irene Limbo on 06/24/19. She did confirm [name] has been taking 40 meq of KLOR CON as prescribed since 06/24/19. She is taking the pills without difficulty. I advised that she will need to contact Ms. Volanda Napoleon' PCP to manage the potassium levels. She voiced understanding of this and plans to call the PCP. She did voice that she doesn't understand why her mother's potassium is low and believes that radiation and difficulty swallowing may be the cause of it, as she has never had difficulty with potassium in the past.  She would like to speak to Dr. Isidore Moos when her mom sees her on Monday 06/30/19 after her radiation treatment. I assured her that that could be arranged. She voiced her appreciation for the phone call, and does know to call me with any further questions or concerns.

## 2019-06-30 ENCOUNTER — Other Ambulatory Visit: Payer: Self-pay

## 2019-06-30 ENCOUNTER — Ambulatory Visit: Payer: Medicare Other

## 2019-06-30 ENCOUNTER — Ambulatory Visit
Admission: RE | Admit: 2019-06-30 | Discharge: 2019-06-30 | Disposition: A | Discharge status: Home / Self Care | Payer: Medicare Other | Source: Ambulatory Visit | Attending: Radiation Oncology | Admitting: Radiation Oncology

## 2019-06-30 DIAGNOSIS — Z510 Encounter for antineoplastic radiation therapy: Secondary | ICD-10-CM | POA: Diagnosis not present

## 2019-07-01 ENCOUNTER — Other Ambulatory Visit: Payer: Self-pay | Admitting: Radiation Oncology

## 2019-07-01 ENCOUNTER — Other Ambulatory Visit: Payer: Self-pay

## 2019-07-01 ENCOUNTER — Inpatient Hospital Stay: Payer: Medicare Other

## 2019-07-01 ENCOUNTER — Inpatient Hospital Stay: Payer: Medicare Other | Admitting: Hematology

## 2019-07-01 ENCOUNTER — Ambulatory Visit: Payer: Medicare Other

## 2019-07-01 ENCOUNTER — Telehealth: Payer: Self-pay | Admitting: *Deleted

## 2019-07-01 ENCOUNTER — Ambulatory Visit
Admission: RE | Admit: 2019-07-01 | Discharge: 2019-07-01 | Disposition: A | Discharge status: Home / Self Care | Payer: Medicare Other | Source: Ambulatory Visit | Attending: Radiation Oncology | Admitting: Radiation Oncology

## 2019-07-01 DIAGNOSIS — E876 Hypokalemia: Secondary | ICD-10-CM

## 2019-07-01 DIAGNOSIS — Z510 Encounter for antineoplastic radiation therapy: Secondary | ICD-10-CM | POA: Diagnosis not present

## 2019-07-01 NOTE — Telephone Encounter (Signed)
Oncology Nurse Navigator Documentation  Rec'd call from RTT [place] indicating Jodi [name] has not arrived for 9:00 appt, they have not been able to reach her.  I called her, LVMM asking for return call.  [name], RN, BSN Head & Neck Oncology Nurse [place] at [place] [phone]

## 2019-07-01 NOTE — Telephone Encounter (Signed)
Oncology Nurse Navigator Documentation  Rec'd call from Ms. Vanderloop, reviewed upcoming RT appts per schedule provided by RTT today when she came for tmt.  [name], RN, BSN Head & Neck Oncology Nurse [place] at [place] [phone]

## 2019-07-01 NOTE — Telephone Encounter (Signed)
Per [name], RN (head/Neck RN Navigator), patient has decided not to have treatment - chemotherapy or Radiation - and that appts for 07/01/2019 and 10/20 for chemotherapy should be cancelled. MD/lab/chemo appts for 10/13 cancelled, appt for chemo 10/20 cancelled. Appts for lab/MD for 10/20 still scheduled at this time to provide opportunity should patient decide to see Dr.Kale.

## 2019-07-02 ENCOUNTER — Other Ambulatory Visit: Payer: Self-pay

## 2019-07-02 ENCOUNTER — Telehealth: Payer: Self-pay | Admitting: *Deleted

## 2019-07-02 ENCOUNTER — Ambulatory Visit: Payer: Medicare Other

## 2019-07-02 ENCOUNTER — Ambulatory Visit
Admission: RE | Admit: 2019-07-02 | Discharge: 2019-07-02 | Disposition: A | Discharge status: Home / Self Care | Payer: Medicare Other | Source: Ambulatory Visit | Attending: Radiation Oncology | Admitting: Radiation Oncology

## 2019-07-02 DIAGNOSIS — Z510 Encounter for antineoplastic radiation therapy: Secondary | ICD-10-CM | POA: Diagnosis not present

## 2019-07-02 NOTE — Telephone Encounter (Signed)
Called patient to inform of lab appt. for 07-03-19- @ 2:45 pm @ [facility], spoke with patient and she is aware of this appt.

## 2019-07-03 ENCOUNTER — Other Ambulatory Visit: Payer: Self-pay

## 2019-07-03 ENCOUNTER — Telehealth: Payer: Self-pay | Admitting: *Deleted

## 2019-07-03 ENCOUNTER — Ambulatory Visit
Admission: RE | Admit: 2019-07-03 | Discharge: 2019-07-03 | Disposition: A | Discharge status: Home / Self Care | Payer: Medicare Other | Source: Ambulatory Visit | Attending: Radiation Oncology | Admitting: Radiation Oncology

## 2019-07-03 ENCOUNTER — Ambulatory Visit: Payer: Medicare Other

## 2019-07-03 DIAGNOSIS — E876 Hypokalemia: Secondary | ICD-10-CM

## 2019-07-03 DIAGNOSIS — Z510 Encounter for antineoplastic radiation therapy: Secondary | ICD-10-CM | POA: Diagnosis not present

## 2019-07-03 LAB — BASIC METABOLIC PANEL - CANCER CENTER ONLY
Anion gap: 10 (ref 5–15)
BUN: 12 mg/dL (ref 6–20)
CO2: 32 mmol/L (ref 22–32)
Calcium: 9.1 mg/dL (ref 8.9–10.3)
Chloride: 100 mmol/L (ref 98–111)
Creatinine: 0.8 mg/dL (ref 0.44–1.00)
GFR, Est AFR Am: >60 mL/min (ref >60)
GFR, Estimated: >60 mL/min (ref >60)
Glucose, Bld: 97 mg/dL (ref 70–99)
Potassium: 3.5 mmol/L (ref 3.5–5.1)
Sodium: 142 mmol/L (ref 135–145)

## 2019-07-03 NOTE — Telephone Encounter (Signed)
Oncology Nurse Navigator Documentation  Called Ms. Nester to inform her today's lab indicated potassium WNL.  I attributed improvement to her compliance with Klor-Con Rx, encouraged her to continue as directed.  Emphasized, again, importance of her scheduling PCP appt for ongoing potassium surveillance.  She voiced appreciation for call.  [name], RN, BSN Head & Neck Oncology Nurse [place] at [place] [phone]

## 2019-07-03 NOTE — Telephone Encounter (Signed)
Oncology Nurse Navigator Documentation  Called Ms. Branscom to confirm understanding of 2:45 lab s/p 2:15 RT tmt, explained importance of checking K level to determine improvement since 10/6 and her initiation of Klor-Con 20 meq tablets.  She reported she is currently taking 1 tab BID as prescribe.  She indicated she has not made appt with PCP for further monitoring as encouraged, promised to call tomorrow morning.  I requested she call me with appt date/time when arranged.  She agreed to do so.  Spoke with LINAC 2 RTT [place], asked her to insure Ms. Chrisley is transported to lobby to register for lab s/p RT.  She acknowledged.  [name], RN, BSN Head & Neck Oncology Nurse [place] at [place] [phone]

## 2019-07-04 ENCOUNTER — Ambulatory Visit
Admission: RE | Admit: 2019-07-04 | Discharge: 2019-07-04 | Disposition: A | Discharge status: Home / Self Care | Payer: Medicare Other | Source: Ambulatory Visit | Attending: Radiation Oncology | Admitting: Radiation Oncology

## 2019-07-04 ENCOUNTER — Ambulatory Visit: Payer: Medicare Other

## 2019-07-04 ENCOUNTER — Other Ambulatory Visit: Payer: Self-pay

## 2019-07-04 DIAGNOSIS — Z510 Encounter for antineoplastic radiation therapy: Secondary | ICD-10-CM | POA: Diagnosis not present

## 2019-07-07 ENCOUNTER — Other Ambulatory Visit: Payer: Self-pay

## 2019-07-07 ENCOUNTER — Ambulatory Visit
Admission: RE | Admit: 2019-07-07 | Discharge: 2019-07-07 | Disposition: A | Discharge status: Home / Self Care | Payer: Medicare Other | Source: Ambulatory Visit | Attending: Radiation Oncology | Admitting: Radiation Oncology

## 2019-07-07 DIAGNOSIS — Z510 Encounter for antineoplastic radiation therapy: Secondary | ICD-10-CM | POA: Diagnosis not present

## 2019-07-08 ENCOUNTER — Other Ambulatory Visit: Payer: Medicare Other

## 2019-07-08 ENCOUNTER — Ambulatory Visit: Payer: Medicare Other | Admitting: Hematology

## 2019-07-08 ENCOUNTER — Ambulatory Visit: Payer: Medicare Other

## 2019-07-08 ENCOUNTER — Other Ambulatory Visit: Payer: Self-pay

## 2019-07-08 ENCOUNTER — Ambulatory Visit
Admission: RE | Admit: 2019-07-08 | Discharge: 2019-07-08 | Disposition: A | Discharge status: Home / Self Care | Payer: Medicare Other | Source: Ambulatory Visit | Attending: Radiation Oncology | Admitting: Radiation Oncology

## 2019-07-08 DIAGNOSIS — Z510 Encounter for antineoplastic radiation therapy: Secondary | ICD-10-CM | POA: Diagnosis not present

## 2019-07-09 ENCOUNTER — Ambulatory Visit
Admission: RE | Admit: 2019-07-09 | Discharge: 2019-07-09 | Disposition: A | Discharge status: Home / Self Care | Payer: Medicare Other | Source: Ambulatory Visit | Attending: Radiation Oncology | Admitting: Radiation Oncology

## 2019-07-09 ENCOUNTER — Other Ambulatory Visit: Payer: Self-pay

## 2019-07-09 DIAGNOSIS — Z510 Encounter for antineoplastic radiation therapy: Secondary | ICD-10-CM | POA: Diagnosis not present

## 2019-07-10 ENCOUNTER — Other Ambulatory Visit: Payer: Self-pay

## 2019-07-10 ENCOUNTER — Ambulatory Visit
Admission: RE | Admit: 2019-07-10 | Discharge: 2019-07-10 | Disposition: A | Discharge status: Home / Self Care | Payer: Medicare Other | Source: Ambulatory Visit | Attending: Radiation Oncology | Admitting: Radiation Oncology

## 2019-07-10 DIAGNOSIS — Z510 Encounter for antineoplastic radiation therapy: Secondary | ICD-10-CM | POA: Diagnosis not present

## 2019-07-11 ENCOUNTER — Ambulatory Visit: Payer: Medicare Other

## 2019-07-11 ENCOUNTER — Encounter: Payer: Self-pay | Admitting: Radiation Oncology

## 2019-07-11 ENCOUNTER — Encounter: Payer: Self-pay | Admitting: *Deleted

## 2019-07-11 ENCOUNTER — Other Ambulatory Visit: Payer: Self-pay

## 2019-07-11 ENCOUNTER — Ambulatory Visit
Admission: RE | Admit: 2019-07-11 | Discharge: 2019-07-11 | Disposition: A | Discharge status: Home / Self Care | Payer: Medicare Other | Source: Ambulatory Visit | Attending: Radiation Oncology | Admitting: Radiation Oncology

## 2019-07-11 DIAGNOSIS — Z510 Encounter for antineoplastic radiation therapy: Secondary | ICD-10-CM | POA: Diagnosis not present

## 2019-07-12 ENCOUNTER — Encounter (INDEPENDENT_AMBULATORY_CARE_PROVIDER_SITE_OTHER): Payer: Self-pay

## 2019-07-12 NOTE — Progress Notes (Signed)
Oncology Nurse Navigator Documentation  Met with [name] after final RT to offer support and to celebrate end of radiation treatment.   Provided verbal/written post-RT guidance:  Importance of keeping all follow-up appts, noted written reminder of 11/6 4:20 Follow-up with Dr. Isidore Moos.  Importance of protecting treatment area from sun.  Continuation of Sonafine application 2-3 times daily, application of abx ointment to areas of raw skin; when supply of Sonafine exhausted transition to OTC lotion with vitamin E. Explained my role as navigator will continue for several more months, I will be calling or joining her during follow-up visits.   She agreed to call me with needs/concerns.    [name], RN, BSN Head & Neck Oncology [place] at [place] [phone]

## 2019-07-14 ENCOUNTER — Ambulatory Visit: Payer: Medicare Other

## 2019-07-21 ENCOUNTER — Telehealth: Payer: Self-pay | Admitting: *Deleted

## 2019-07-21 NOTE — Telephone Encounter (Signed)
Called patient to ask about rescheduling fu for 07-25-19 due to another doctor's appt., patient agreed to 08-01-19 @ 3:40 pm

## 2019-07-21 NOTE — Telephone Encounter (Signed)
Oncology Nurse Navigator Documentation  Rec'd call from Ms. Taussig.    She indicated she has 2:00 appt in [place] on Friday which will conflict with [id number] 2-wk post-tmt follow-up with Dr. Isidore Moos, asked about rescheduling.  She stated she is doing well, denied concerns.    I explained Dr. Isidore Moos prefers to see patients in-person for first appt after EOT, recommended reschedule for next week.  I indicated I would have RN Anderson Malta call her to conduct phone assessment, coordinate rescheduling of appt.  [name], RN, BSN Head & Neck Oncology Nurse [place] at [place] [phone]

## 2019-07-25 ENCOUNTER — Ambulatory Visit: Payer: Self-pay | Admitting: Radiation Oncology

## 2019-07-31 NOTE — Progress Notes (Signed)
  [name] presents for follow up of radiation completed 07/11/19 to her maxillary sinus and bilateral neck nodes.   Pain issues, if any: She denies.  Using a feeding tube?: No Weight changes, if any:  Wt Readings from Last 3 Encounters:  08/01/19 274 lb 12.8 oz (124.6 kg)  06/02/19 (!) 312 lb 3.2 oz (141.6 kg)  04/30/19 (!) 318 lb 9.6 oz (144.5 kg)   Swallowing issues, if any: SHe is eating spaghetti with meat sauce (frozen dinner) for dinner, cream of wheat or grits for breakfast. Throughout the day she snacks on jello or pudding. She drinks lots of water, diet soda, and milk. She is not able to eat other foods due to taste issues.  Smoking or chewing tobacco? no Using fluoride trays daily? No Last ENT visit was on: 07/09/19 Dr. Charolett Bumpers at [facility]: Assessment/Recommendations: The patient is a 58 y.o. 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 The patient is now status-post left maxillary antrostomy and anterior ethmoidectomy with biopsy performed on 11/21/2018.  I emphasized the importance of completion of radiation therapy and close follow-up with her Medical and Radiation Oncologists.  I will arrange in-person follow-up in ~1 weeks' time.  The patient endorsed complete understanding of the plan as noted above and is in full agreement.  Electronically signed by Elisabeth Cara, MD at 07/09/2019 8:46 AM EDT    She is scheduled to see Dr. Elisabeth Cara again on 08/08/19 Other notable issues, if any:  05/22/19 chemotherapy infusion at [facility] as ordered by Dr. Harriette Ohara.   BP (!) 91/54 (BP Location: Right Arm)    Pulse 66   Temp 98.3 F (36.8 C) (Tympanic)   Wt 274 lb 12.8 oz (124.6 kg)   SpO2 100%   BMI 47.17 kg/m

## 2019-08-01 ENCOUNTER — Other Ambulatory Visit: Payer: Self-pay

## 2019-08-01 ENCOUNTER — Ambulatory Visit
Admission: RE | Admit: 2019-08-01 | Discharge: 2019-08-01 | Disposition: A | Discharge status: Home / Self Care | Payer: Medicare Other | Source: Ambulatory Visit | Attending: Radiation Oncology | Admitting: Radiation Oncology

## 2019-08-01 ENCOUNTER — Encounter: Payer: Self-pay | Admitting: Radiation Oncology

## 2019-08-01 DIAGNOSIS — Z923 Personal history of irradiation: Secondary | ICD-10-CM | POA: Insufficient documentation

## 2019-08-01 DIAGNOSIS — Z79899 Other long term (current) drug therapy: Secondary | ICD-10-CM | POA: Insufficient documentation

## 2019-08-01 DIAGNOSIS — Z7984 Long term (current) use of oral hypoglycemic drugs: Secondary | ICD-10-CM | POA: Insufficient documentation

## 2019-08-01 DIAGNOSIS — C310 Malignant neoplasm of maxillary sinus: Secondary | ICD-10-CM

## 2019-08-01 NOTE — Progress Notes (Signed)
  Patient Name: Jodi [name] MRN: [id number] DOB: 1960/11/29 Referring Physician: Aundra Millet (Profile Not Attached) Date of Service: 07/11/2019 [facility] [place]                                                        End Of Treatment Note  Diagnoses: C31.0-Malignant neoplasm of maxillary sinus  Cancer Staging Malignant neoplasm of maxillary sinus (HCC) Staging form: Maxillary Sinus, AJCC 8th Edition - Clinical: Stage IVA (cT4a, cN2b, cM0) - Signed by Eppie Gibson, MD on 04/20/2019 - Pathologic: Stage III (pT3, pN0, cM0) - Signed by Eppie Gibson, MD on 06/29/2017   Intent: Curative  Radiation Treatment Dates: 05/12/2019 through 07/11/2019 Site Technique Total Dose (Gy) Dose per Fx (Gy) Completed Fx Beam Energies  Head & neck: HN_MaxSinu IMRT 70/70 2 35/35 6X   Narrative: The patient tolerated chemoradiation therapy fairly. She experienced dry nose with bleeding at times, moist desquamation to her anterior neck, pain, soreness to her tongue causing difficulty eating, and weight loss. She was on the verge of stopping treatment AMA, but instead took a break from radiation between 10/6 until 10/12. She resumed radiation treatments while holding chemotherapy and feeling much better-- her skin healed, she was eating better, and her pain improved. Towards the end of treatment, she experienced diminished taste, but she continued to deny pain and difficulty swallowing.  Plan: The patient will follow-up with radiation oncology in 2 weeks.  ________________________________________________   Eppie Gibson, MD  This document serves as a record of services personally performed by Eppie Gibson, MD. It was created on her behalf by Wilburn Mylar, a trained medical scribe. The creation of this record is based on the scribe's  personal observations and the provider's statements to them. This document has been checked and approved by the attending provider.

## 2019-08-01 NOTE — Progress Notes (Signed)
Radiation Oncology         [phone] ________________________________  Name: Jodi [name] MRN: [id number]  Date: 08/01/2019  DOB: 1960-12-04  Follow-Up Visit Note  CC: Bartholome Bill, MD  Aundra Millet, MD  Diagnosis and Prior Radiotherapy:       ICD-10-CM   1. Malignant neoplasm of maxillary sinus ([place])  C31.0     Radiation Treatment Dates: 05/12/2019 through 07/11/2019 Site Technique Total Dose (Gy) Dose per Fx (Gy) Completed Fx Beam Energies  Head & neck: HN_MaxSinu IMRT 70/70 2 35/35 6X    CHIEF COMPLAINT:  Here for follow-up and surveillance of recurrent sinus cancer  Narrative:  The patient returns today for routine follow-up.  Using a feeding tube?: No Weight changes, if any:  Wt Readings from Last 3 Encounters:  08/01/19 274 lb 12.8 oz (124.6 kg)  06/02/19 (!) 312 lb 3.2 oz (141.6 kg)  04/30/19 (!) 318 lb 9.6 oz (144.5 kg)   Swallowing issues, if any: She is eating spaghetti with meat sauce (frozen dinner) for dinner, cream of wheat or grits for breakfast. Throughout the day she snacks on jello or pudding. She drinks lots of water, diet soda, and milk. She is not able to eat other foods due to taste issues.  Smoking or chewing tobacco? no She is scheduled to see Dr. Elisabeth Cara again on 08/08/19 Other notable issues, if any:  05/22/19 chemotherapy infusion at [facility] as ordered by Dr. Harriette Ohara.    ALLERGIES:  is allergic to penicillins.  Meds: Current Outpatient Medications  Medication Sig Dispense Refill  . acetaminophen (TYLENOL 8 HOUR) 650 MG CR tablet Take 1 tablet (650 mg total) by mouth every 8 (eight) hours as needed for pain. 30 tablet 0  . ARIPiprazole (ABILIFY) 15 MG tablet Take 15 mg by mouth at bedtime.    [name] Dextran 70-Hypromellose (ARTIFICIAL TEARS) 0.1-0.3 % SOLN 1 drop 4 (four) times a day as needed (dry eyes).    [name] divalproex (DEPAKOTE) 500 MG DR tablet Take 500 mg by mouth at bedtime.    . furosemide (LASIX) 40 MG  tablet Take 2 tablets (80 mg total) by mouth 2 (two) times daily. (Patient taking differently: Take 80 mg by mouth daily. ) 120 tablet 2  . lidocaine (XYLOCAINE) 2 % solution Patient: Mix 1part 2% viscous lidocaine, 1part H20. Swish & swallow 68mL of diluted mixture, 7min before meals and at bedtime, up to QID 100 mL 5  . lidocaine-prilocaine (EMLA) cream Apply to affected area once 30 g 3  . metFORMIN (GLUCOPHAGE) 500 MG tablet Take 500 mg by mouth 2 (two) times daily with a meal.    . potassium chloride SA (KLOR-CON) 20 MEQ tablet 21meq three times daily for 2 days then 12meq po twice daily for 1 week and f/u with PCP for further management of hypokalemia 60 tablet 0  . dexamethasone (DECADRON) 4 MG tablet Take 2 tablets by mouth once a day on the day after chemotherapy and then take 2 tablets two times a day for 2 days. Take with food. (Patient not taking: Reported on 08/01/2019) 30 tablet 1  . docusate sodium (COLACE) 100 MG capsule Take 2 capsules (200 mg total) by mouth daily as needed for mild constipation. (Patient not taking: Reported on 08/01/2019) 100 capsule 0  . HYDROcodone-acetaminophen (HYCET) 7.5-325 mg/15 ml solution Take 10-15 mLs by mouth every 4 (four) hours as needed for moderate pain. Take with food. (Patient not taking: Reported on 08/01/2019)  473 mL 0  . ondansetron (ZOFRAN) 4 MG tablet Take 1 tablet (4 mg total) by mouth every 8 (eight) hours as needed for nausea or vomiting. (Patient not taking: Reported on 06/29/2017) 12 tablet 0  . ondansetron (ZOFRAN) 8 MG tablet Take 1 tablet (8 mg total) by mouth 2 (two) times daily as needed. Start on the third day after chemotherapy. (Patient not taking: Reported on 08/01/2019) 30 tablet 1  . prochlorperazine (COMPAZINE) 10 MG tablet Take 1 tablet (10 mg total) by mouth every 6 (six) hours as needed (Nausea or vomiting). (Patient not taking: Reported on 08/01/2019) 30 tablet 1  . sodium fluoride (PREVIDENT 5000 PLUS) 1.1 % CREA dental  cream Apply to tooth brush. Brush teeth for 2 minutes. Spit out excess-DO NOT swallow. Repeat nightly. (Patient not taking: Reported on 11/08/2018) 1 Tube prn   No current facility-administered medications for this encounter.     Physical Findings: The patient is in no acute distress. Patient is alert and oriented. Wt Readings from Last 3 Encounters:  08/01/19 274 lb 12.8 oz (124.6 kg)  06/02/19 (!) 312 lb 3.2 oz (141.6 kg)  04/30/19 (!) 318 lb 9.6 oz (144.5 kg)    weight is 274 lb 12.8 oz (124.6 kg). Her tympanic temperature is 98.3 F (36.8 C). Her blood pressure is 91/54 (abnormal) and her pulse is 66. Her oxygen saturation is 100%. .  General: Alert and oriented, in no acute distress -she presents in a wheelchair HEENT:  Oropharynx is notable for healing mucosa with continued patchy hyperpigmentation.  No thrush Neck: Neck is notable for excellent healing of skin Skin: Skin in treatment fields shows excellent healing Psychiatric: . Affect is appropriate.   Lab Findings: Lab Results  Component Value Date   WBC 5.3 06/24/2019   HGB 13.0 06/24/2019   HCT 39.7 06/24/2019   MCV 98.5 06/24/2019   PLT 217 06/24/2019    Lab Results  Component Value Date   TSH 0.933 12/15/2013    Radiographic Findings: No results found.  Impression/Plan:    1) Head and Neck Cancer Status: Healing from adjuvant therapy  2) Nutritional Status: Losing weight, I will refer her back to nutrition.  I reiterated ways for her to boost her nutrition today. She understands this is critical to recovery  3) Risk Factors: The patient has been educated about risk factors including alcohol and tobacco abuse; they understand that avoidance of alcohol and tobacco is important to prevent recurrences as well as other cancers  4) Swallowing: Functional  5) Dental:  She understands that regular dental checkups are important given her radiation history.  6) Thyroid function: Monitor annually Lab Results   Component Value Date   TSH 0.933 12/15/2013    7) Other: Continue to follow with surgical oncology and medical oncology  8) Follow-up in early February with restaging PET scan. The patient was encouraged to call with any issues or questions before then.  I spent 10 minutes minutes face to face with the patient and more than 50% of that time was spent in counseling and/or coordination of care. _____________________________________   Eppie Gibson, MD  This document serves as a record of services personally performed by Eppie Gibson, MD. It was created on her behalf by Wilburn Mylar, a trained medical scribe. The creation of this record is based on the scribe's personal observations and the provider's statements to them. This document has been checked and approved by the attending provider.

## 2019-08-06 ENCOUNTER — Encounter: Payer: Self-pay | Admitting: Radiation Oncology

## 2019-08-06 ENCOUNTER — Other Ambulatory Visit: Payer: Self-pay | Admitting: Radiation Oncology

## 2019-08-06 DIAGNOSIS — C310 Malignant neoplasm of maxillary sinus: Secondary | ICD-10-CM

## 2019-08-12 ENCOUNTER — Inpatient Hospital Stay: Payer: Medicare Other | Attending: Hematology | Admitting: Nutrition

## 2019-08-12 ENCOUNTER — Telehealth: Payer: Self-pay | Admitting: Nutrition

## 2019-08-12 NOTE — Telephone Encounter (Signed)
Contacted patient by telephone to follow up on nutrition since completing radiation treatments for SCC of left maxillary sinus. Patient was not available but I left my name and phone number for return call.

## 2019-09-10 ENCOUNTER — Telehealth: Payer: Self-pay | Admitting: *Deleted

## 2019-09-10 NOTE — Telephone Encounter (Signed)
Oncology Nurse Navigator Documentation  Ms. Stammer called to inform she had follow-up with MD in [facility] last week, was told "everything looks good".  She asked about the scheduling of post-tmt PET.  I explained restaging PETs typically are conducted about 4 months s/p EOT, noted she had last RT 10/23, indicated PET will be scheduled in early Feb per her 11/13 follow-up with Dr. Isidore Moos.  She voiced understanding.  [name], RN, BSN Head & Neck Oncology Nurse [place] at [place] [phone]

## 2019-09-29 ENCOUNTER — Telehealth: Payer: Self-pay | Admitting: *Deleted

## 2019-09-29 NOTE — Telephone Encounter (Addendum)
Oncology Nurse Navigator Documentation  Jodi [name] called this morning to report:  Onset over the weekend of R nose/sinus congestion, "hardness around R nostril/cheek, under eyes", sinus congestion, difficulty breathing through nose.    She indicated her dtr Jodi [name] was going to take her to [facility] to purchase a humidifier with hopes it will ease congestion.    Jodi [name] requested appt with Dr. Isidore Moos for evaluation.  During follow-up conversation with dtr Jodi [name] this afternoon:  She described swelling as "squishy" but not hard.    I explained to [facility] her description is similar to the neck swelling her mother described last week Wed.  (In that conversation [name] indicated the swelling was most prominent in the morning, dissipated during the day.  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  I suggested appt with ENT in [facility] appropriate of nasal congestion does not resolve with humidification.  Jodi [name] indicated she would call me to let me know if humidifier helps.  Referral made to [facility] for evaluation of lymphedema. Dr. Isidore Moos and RN Anderson Malta [place] updated.  [name], RN, BSN Head & Neck Oncology Nurse [place] at [place] [phone]

## 2019-09-30 ENCOUNTER — Other Ambulatory Visit: Payer: Self-pay | Admitting: *Deleted

## 2019-09-30 DIAGNOSIS — C310 Malignant neoplasm of maxillary sinus: Secondary | ICD-10-CM

## 2019-10-03 ENCOUNTER — Other Ambulatory Visit: Payer: Self-pay

## 2019-10-03 ENCOUNTER — Ambulatory Visit: Payer: Medicare Other | Attending: Radiation Oncology | Admitting: Rehabilitation

## 2019-10-03 ENCOUNTER — Encounter: Payer: Self-pay | Admitting: Rehabilitation

## 2019-10-03 DIAGNOSIS — I890 Lymphedema, not elsewhere classified: Secondary | ICD-10-CM | POA: Diagnosis not present

## 2019-10-03 DIAGNOSIS — R293 Abnormal posture: Secondary | ICD-10-CM | POA: Diagnosis present

## 2019-10-03 NOTE — Patient Instructions (Signed)
Wear the compression at night 30 min to 2 hours if tolerated well  Sleep elevated

## 2019-10-03 NOTE — Therapy (Addendum)
[place], [place], [address] Phone: [phone]   Fax:  [phone]  Physical Therapy Treatment  Patient Details  Name: Jodi [name] MRN: [id number] Date of Birth: July 13, 1961 Referring Provider (PT): Dr. Isidore Moos   Encounter Date: 10/03/2019  PT End of Session - 10/03/19 1206    Visit Number  1    Number of Visits  13    Date for PT Re-Evaluation  11/21/19    Authorization Type  Medicare    PT Start Time  1115    PT Stop Time  1159    PT Time Calculation (min)  44 min    Activity Tolerance  Patient tolerated treatment well    Behavior During Therapy  [facility] for tasks assessed/performed       Past Medical History:  Diagnosis Date  . Anginal pain ([name])   . Atypical chest pain   . Bipolar disorder ([name])   . CHF (congestive heart failure) ([name])   . Chronic back pain   . Chronic knee pain   . Cor pulmonale (chronic) ([place])   . Depression   . Diabetes mellitus    Type II  . Dyspnea    with fluid retention; with activity  . Headache   . Hyperlipemia   . 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 Moderate to severe pulmonary hypertension ([place])   . Morbid obesity ([place])   . Obesity hypoventilation syndrome ([place])   . On home oxygen therapy    PRN  . Pickwickian syndrome ([place])   . Thoracic aortic aneurysm [facility])     Past Surgical History:  Procedure Laterality Date  . RIGHT OOPHORECTOMY    . SINUS ENDO WITH FUSION N/A 02/13/2017   Procedure: ENDOSCOPIC SINUS ENDO WITH FUSION;  Surgeon: Rozetta Nunnery, MD;  Location: [facility] OR;  Service: ENT;  Laterality: N/A;  . SINUSOTOMY Right 02/13/2017   Procedure: [place];  Surgeon: Rozetta Nunnery, MD;  Location: [place];  Service: ENT;  Laterality: Right;  . TONSILLECTOMY      There were no vitals filed for this visit.  Subjective Assessment - 10/03/19 1119    Subjective  Pt presents with new onset of  swelling under the chin and on the right side of the face at the start on January.  It fluctuates throughout the day.  Maybe some nose stuffiness on the right side.    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  Did have initial same cancer on the right side with removal of tumor and roof of the mouth with reconstruction.  Other history includes bipolar disorder, CHF, chronic back pain, DM, pulmonary hypertension, morbid obesity, thoracic aortic aneurysm.    Patient Stated Goals  deecrease the fluid    Currently in Pain?  No/denies         [facility] PT Assessment - 10/03/19 0001      Assessment   Medical Diagnosis  neoplasm of the maxillary sinus     Referring Provider (PT)  Dr. Isidore Moos    Onset Date/Surgical Date  09/19/19    Hand Dominance  Right    Next MD Visit  as needed      Precautions   Precaution Comments  lymphedema      Restrictions   Weight Bearing Restrictions  No  Balance Screen   Has the patient fallen in the past 6 months  No    Has the patient had a decrease in activity level because of a fear of falling?   Yes    Is the patient reluctant to leave their home because of a fear of falling?   No      Home Environment   Living Environment  Private residence    [place]  Level entry    [place]  One level    Additional Comments  cane and office chair at home      Prior Function   Level of Independence  Independent with household mobility with device    Vocation  Unemployed      Cognition   Overall Cognitive Status  Within Functional Limits for tasks assessed      Observation/Other Assessments   Observations  significant swelling under the chin; well healed inclsion under the Rt eye from original surgery      Coordination   Gross Motor Movements are Fluid and Coordinated  Yes       Posture/Postural Control   Posture/Postural Control  Postural limitations    Postural Limitations  Rounded Shoulders;Forward head      ROM / Strength   AROM / PROM / Strength  AROM      AROM   Overall AROM   Within functional limits for tasks performed      Transfers   Transfers  Sit to Stand    Sit to Stand  6: Modified independent (Device/Increase time)      Ambulation/Gait   Assistive device  Straight cane    Gait Comments  waddling gait, stops and leans forward on the cane when able to rest        LYMPHEDEMA/ONCOLOGY QUESTIONNAIRE - 10/03/19 1136      Type   Cancer Type  Maxillary sinus      Surgeries   Other Surgery Date  --   sinusotomy 02/12/17 Rt     Date Lymphedema/Swelling Started   Date  09/19/19      Treatment   Active Chemotherapy Treatment  No    Past Chemotherapy Treatment  Yes    Active Radiation Treatment  No    Past Radiation Treatment  Yes      What other symptoms do you have   Are you Having Heaviness or Tightness  No    Are you having Pain  No      Lymphedema Assessments   Lymphedema Assessments  Head and Neck      Head and Neck   Right Lateral Nostril at base of nose to medial tragus   10 cm    Left Lateral Nostril at base of nose to medial tragus   10 cm    Right Corner of mouth to where ear lobe meets face  10.2 cm    Left Corner of mouth to where ear lobe meets face  10.2 cm    4 cm superior to sternal notch around neck  --   hard to get due to size   Other  26.2   chin strap measurement from ear to ear                   [facility] Adult PT Treatment/Exercise - 10/03/19 0001      Self-Care   Self-Care  Other Self-Care Comments    Other Self-Care Comments   discussed sleeping  elevated      Manual Therapy   Manual Therapy  Edema management    Edema Management  made 1/2" gray foam rectangle with waffle lines in a tg soft for initial chin strap compression             PT Education - 10/03/19 1206    Education  Details  lymphedema, POC, compression strap    Person(s) Educated  Patient;Child(ren)    Methods  Explanation;Demonstration    Comprehension  Verbalized understanding          PT Long Term Goals - 10/03/19 1217      PT LONG TERM GOAL #1   Title  Pt will decrease submental lymphedema by at least 3 cm    Baseline  26.3 chin strap ear to ear    Time  6    Period  Weeks    Status  New      PT LONG TERM GOAL #2   Title  Pt and daughter will be ind with self MLD for the head and neck and/or compression pump use if appropriate    Time  6    Period  Weeks    Status  New      PT LONG TERM GOAL #3   Title  Pt will be set up with appropriate compression for the head and neck    Time  6    Period  Weeks    Status  New            Plan - 10/03/19 1207    Clinical Impression Statement  Pt presents with signficant submental swelling/lymphedema about 3 months post radiation with more mild swelling into the Rt side of the face where pt had a sinustomy in 2018 for the initial tumor.  Pt had significant skin desquamation and tightening per photo from the daughter at the end of radiation although skin has improved greatly since then with only mild darkening.  Pt does have history of acute and chronic CHF and pulmonary hypertension and has not seen her cardiologist since 2015 so we discussed having a visit with them in the near future which daughter agrees although no signs of CHF currently. pt also questioning how many metformin to take so inbox message was sent to Dr. Luciana Axe her PCP to see if this could be straightened up.    Personal Factors and Comorbidities  Fitness;Behavior Pattern;Comorbidity 2    Comorbidities  surgical history, radiation history    Stability/Clinical Decision Making  Stable/Uncomplicated    Clinical Decision Making  Low    Rehab Potential  Good    PT Frequency  2x / week    PT Duration  6 weeks    PT Treatment/Interventions  ADLs/Self Care Home  Management;Patient/family education;Manual lymph drainage;Manual techniques;Compression bandaging;Taping;Passive range of motion;Scar mobilization;Therapeutic exercise    PT Next Visit Plan  how was foam? need to increase to chips vs foam or add short stretch? begin MLD to the head and neck including Rt face (no abdominals due to aortic aneurysm history)    Consulted and Agree with Plan of Care  Patient;Family member/caregiver       Patient will benefit from skilled therapeutic intervention in order to improve the following deficits and impairments:  Decreased knowledge of use of DME, Increased edema, Postural dysfunction  Visit Diagnosis: Lymphedema  Abnormal posture     Problem List Patient Active Problem List   Diagnosis Date Noted  . Counseling regarding advance care planning and  goals of care 06/05/2019  . Goals of care, counseling/discussion 01/17/2019  . 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 Bipolar disorder, in partial remission, most recent episode manic ([place])   . Pulmonary hypertension ([place])   . Acute respiratory failure with hypoxia and hypercarbia (HCC)   . Morbid obesity due to excess calories ([place])   . Nocturnal hypoxemia due to obesity   . Acute on chronic diastolic (congestive) heart failure ([place]) 12/03/2015  . CHF (congestive heart failure) ([place]) 11/18/2015  . Non compliance with medical treatment 09/16/2015  . Bipolar disorder ([place]) 09/15/2015  . Type 2 diabetes mellitus ([place]) 09/15/2015  . Morbid obesity ([place]) 09/15/2015    Stark Bray 10/03/2019, 12:19 PM  [place], [place], [address] Phone: [phone]   Fax:  [phone]  Name: Mauria Shontz MRN: [id number] Date of Birth: 29-Oct-1960

## 2019-10-06 ENCOUNTER — Telehealth: Payer: Self-pay | Admitting: *Deleted

## 2019-10-06 NOTE — Telephone Encounter (Signed)
Oncology Nurse Navigator Documentation  Called Jodi [name] in follow-up to 1/15 appt with [facility] PT therapist Shan Levans and message rec'd from [place] indicating Jodi [name] was uncertain about correct furosemide dosage to be taking.  I encouraged her to call prescribing physician (cardiologist or PCP) for guidance.  Ms. Schimanski further noted the humidifier she recently purchased and is now using has helped with the nasal congestion recently reported.    [name], RN, BSN Head & Neck Oncology Nurse [place] [facility] [place] [phone]

## 2019-10-09 ENCOUNTER — Ambulatory Visit: Payer: Medicare Other | Admitting: Physical Therapy

## 2019-10-09 ENCOUNTER — Encounter: Payer: Self-pay | Admitting: Physical Therapy

## 2019-10-09 ENCOUNTER — Other Ambulatory Visit: Payer: Self-pay

## 2019-10-09 DIAGNOSIS — I890 Lymphedema, not elsewhere classified: Secondary | ICD-10-CM

## 2019-10-09 NOTE — Therapy (Addendum)
[place], [place], [address] Phone: [phone]   Fax:  [phone]  Physical Therapy Treatment  Patient Details  Name: Jodi [name] MRN: [id number] Date of Birth: 08-11-1961 Referring Provider (PT): Dr. Isidore Moos   Encounter Date: 10/09/2019  PT End of Session - 10/09/19 1214    Visit Number  2    Number of Visits  13    Date for PT Re-Evaluation  11/21/19    Authorization Type  Medicare    PT Start Time  1106    PT Stop Time  1200    PT Time Calculation (min)  54 min    Activity Tolerance  Patient tolerated treatment well    Behavior During Therapy  [facility] for tasks assessed/performed       Past Medical History:  Diagnosis Date  . Anginal pain ([place])   . Atypical chest pain   . Bipolar disorder ([place])   . CHF (congestive heart failure) ([place])   . Chronic back pain   . Chronic knee pain   . Cor pulmonale (chronic) ([place])   . Depression   . Diabetes mellitus    Type II  . Dyspnea    with fluid retention; with activity  . Headache   . Hyperlipemia   . 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 Moderate to severe pulmonary hypertension ([place])   . Morbid obesity ([place])   . Obesity hypoventilation syndrome ([place])   . On home oxygen therapy    PRN  . Pickwickian syndrome ([place])   . Thoracic aortic aneurysm [facility])     Past Surgical History:  Procedure Laterality Date  . RIGHT OOPHORECTOMY    . SINUS ENDO WITH FUSION N/A 02/13/2017   Procedure: ENDOSCOPIC SINUS ENDO WITH FUSION;  Surgeon: Rozetta Nunnery, MD;  Location: [facility] OR;  Service: ENT;  Laterality: N/A;  . SINUSOTOMY Right 02/13/2017   Procedure: [place];  Surgeon: Rozetta Nunnery, MD;  Location: [place];  Service: ENT;  Laterality: Right;  . TONSILLECTOMY      There were no vitals filed for this visit.  Subjective Assessment - 10/09/19 1113    Subjective  The sides of my neck feel better  but I think the front got more hard.    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  Did have initial same cancer on the right side with removal of tumor and roof of the mouth with reconstruction.  Other history includes bipolar disorder, CHF, chronic back pain, DM, pulmonary hypertension, morbid obesity, thoracic aortic aneurysm.    Patient Stated Goals  deecrease the fluid    Currently in Pain?  No/denies                       [facility] Adult PT Treatment/Exercise - 10/09/19 0001      Manual Therapy   Manual Therapy  Edema management;Manual Lymphatic Drainage (MLD)    Manual therapy comments  instructed pt and Jodi [name] in proper technique and sequence throughout and issued handout: 5 diaphragmatic breaths, short neck, bilateral axillary nodes, bilateral pectoral nodes, posterior, lateral and anterior neck moving fluid towards patways on lateral neck - spent extra time on anterior neck in area of fibrosis (this softened signficantly with treatment) then retracing all steps    Edema Management  created chip pack for pt to wear in TG soft to place addional  compression on anterior neck and help to decrease fibrosis, gave handout about head and neck Flexi for pt to consider if she gets clearance from her cardiologist             PT Education - 10/09/19 1217    Education Details  self MLD sequence and technique, physiology of lymphatic system    Person(s) Educated  Patient    Methods  Explanation    Comprehension  Verbalized understanding          PT Long Term Goals - 10/03/19 1217      PT LONG TERM GOAL #1   Title  Pt will decrease submental lymphedema by at least 3 cm    Baseline  26.3 chin strap ear to ear    Time  6    Period  Weeks    Status  New      PT LONG TERM GOAL #2   Title  Pt and Jodi [name] will be ind with self MLD for the head and neck and/or compression pump use if appropriate     Time  6    Period  Weeks    Status  New      PT LONG TERM GOAL #3   Title  Pt will be set up with appropriate compression for the head and neck    Time  6    Period  Weeks    Status  New            Plan - 10/09/19 1215    Clinical Impression Statement  Began MLD to anterior neck to help decrease lymphedema and fibrosis in anterior neck. Instructed pt and her Jodi [name] throughout in proper technique and sequence and issued handouts to each. Created chip pack in TG soft for pt to try and use to help decrease fibrosis. Educated pt to wear compression as much as possible.    PT Frequency  2x / week    PT Duration  6 weeks    PT Treatment/Interventions  ADLs/Self Care Home Management;Patient/family education;Manual lymph drainage;Manual techniques;Compression bandaging;Taping;Passive range of motion;Scar mobilization;Therapeutic exercise    PT Next Visit Plan  did chip pack help? continue MLD to the head and neck including Rt face (no abdominals due to aortic aneurysm history), see how self MLD is going, Flexi? Did they schedule visit with cardiologist    Consulted and Agree with Plan of Care  Patient;Family member/caregiver    Family Member Consulted  Jodi [name]       Patient will benefit from skilled therapeutic intervention in order to improve the following deficits and impairments:  Decreased knowledge of use of DME, Increased edema, Postural dysfunction  Visit Diagnosis: Lymphedema     Problem List Patient Active Problem List   Diagnosis Date Noted  . Counseling regarding advance care planning and goals of care 06/05/2019  . Goals of care, counseling/discussion 01/17/2019  . 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 Bipolar disorder, in partial remission, most recent episode manic ([place])   . Pulmonary hypertension ([place])   . Acute respiratory failure with hypoxia and hypercarbia (HCC)   . Morbid obesity due to excess calories ([place])   . Nocturnal hypoxemia due  to obesity   . Acute on chronic diastolic (congestive) heart failure ([place]) 12/03/2015  . CHF (congestive heart failure) ([place]) 11/18/2015  . Non compliance with medical treatment 09/16/2015  . Bipolar disorder ([place]) 09/15/2015  . Type 2 diabetes mellitus ([place]) 09/15/2015  . Morbid obesity ([place]) 09/15/2015  Allyson Sabal [facility] 10/09/2019, 12:18 PM  [place], [place], [address] Phone: [phone]   Fax:  [phone]  Name: Anacaren Kohan MRN: [id number] Date of Birth: [birth year]  Manus Gunning, PT 10/09/19 12:18 PM   PHYSICAL THERAPY DISCHARGE SUMMARY  Visits from Start of Care: 2  Current functional level related to goals / functional outcomes: See above   Remaining deficits: See above   Education / Equipment:  self MLD, chip pack Plan: Patient agrees to discharge.  Patient goals were not met. Patient is being discharged due to not returning since the last visit.  ?????    Allyson Sabal [place], [place] 02/26/20 4:36 PM

## 2019-10-10 ENCOUNTER — Ambulatory Visit: Payer: Medicare Other | Admitting: Physical Therapy

## 2019-10-13 ENCOUNTER — Ambulatory Visit: Payer: Medicare Other

## 2019-10-16 ENCOUNTER — Ambulatory Visit: Payer: Medicare Other | Admitting: Physical Therapy

## 2019-10-20 ENCOUNTER — Telehealth: Payer: Self-pay | Admitting: *Deleted

## 2019-10-20 NOTE — Telephone Encounter (Signed)
CALLED PATIENT'S DAUGHTER- DIAMOND TO GIVE INFO AND TELL HER THAT HER MOM'S INSURANCE HAS NOT AGREED TO PAY FOR PET YET, LVM FOR A RETURN CALL

## 2019-10-21 ENCOUNTER — Ambulatory Visit: Payer: Self-pay | Admitting: Radiation Oncology

## 2019-10-22 ENCOUNTER — Ambulatory Visit: Payer: Medicare Other

## 2019-10-23 ENCOUNTER — Ambulatory Visit: Payer: Self-pay | Admitting: Cardiology

## 2019-10-24 ENCOUNTER — Encounter: Payer: Medicare Other | Admitting: Physical Therapy

## 2019-10-29 ENCOUNTER — Encounter: Payer: Self-pay | Admitting: Cardiology

## 2019-10-29 ENCOUNTER — Other Ambulatory Visit: Payer: Self-pay

## 2019-10-29 ENCOUNTER — Ambulatory Visit (INDEPENDENT_AMBULATORY_CARE_PROVIDER_SITE_OTHER): Payer: Medicare Other | Admitting: Cardiology

## 2019-10-29 ENCOUNTER — Ambulatory Visit: Payer: Medicare Other

## 2019-10-29 VITALS — BP 119/78 | HR 59 | Temp 97.7°F | Ht 64.0 in | Wt 279.2 lb

## 2019-10-29 DIAGNOSIS — I10 Essential (primary) hypertension: Secondary | ICD-10-CM

## 2019-10-29 DIAGNOSIS — I5032 Chronic diastolic (congestive) heart failure: Secondary | ICD-10-CM

## 2019-10-29 DIAGNOSIS — I350 Nonrheumatic aortic (valve) stenosis: Secondary | ICD-10-CM

## 2019-10-29 DIAGNOSIS — C310 Malignant neoplasm of maxillary sinus: Secondary | ICD-10-CM | POA: Diagnosis not present

## 2019-10-29 NOTE — Progress Notes (Signed)
Primary Physician/Referring:  Bartholome Bill, MD  Patient ID: Jodi [name], female    DOB: 1961/07/10, 59 y.o.   MRN: [id number]  Chief Complaint  Patient presents with  . Aortic Stenosis  . Congestive Heart Failure  . Hypertension   HPI:    Jodi [name]  is a 59 y.o. with morbid obesity, T2DM, bipolar disorder, and mild aortic stenosis. Last seen in 2018.   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 Being managed by Oncology with [facility].   She feels that she is overall doing well. She states that she has been sent back to [place] by her Oncologist due to concerns of her being on high dose Lasix and Cisplatin for potential nephrotoxicity. She has been on Lasix for the last several years. States that she has intermittent leg swelling, but stable. Dyspnea on exertion is also stable and improved since losing weight over the last few months. No chest pain or dizziness. No palpitations. No PND or orthopnea.    Past Medical History:  Diagnosis Date  . Anginal pain ([place])   . Atypical chest pain   . Bipolar disorder ([place])   . Cancer of nasal cavities ([place])    2018  . CHF (congestive heart failure) ([place])   . Chronic back pain   . Chronic knee pain   . Cor pulmonale (chronic) ([place])   . Depression   . Diabetes mellitus    Type II  . Dyspnea    with fluid retention; with activity  . Headache   . Hyperlipemia   . Malignant neoplasm of maxillary sinus ([place]) 02/13/2017  . Moderate to severe pulmonary hypertension ([place])   . Morbid obesity ([place])   . Obesity hypoventilation syndrome ([place])   . On home oxygen therapy    PRN  . Pickwickian syndrome ([place])   . Thoracic aortic aneurysm [facility])    Past Surgical History:  Procedure Laterality Date  . RIGHT OOPHORECTOMY    . SINUS ENDO WITH FUSION N/A 02/13/2017   Procedure: ENDOSCOPIC SINUS ENDO WITH FUSION;  Surgeon: Rozetta Nunnery, MD;  Location: [facility] OR;   Service: ENT;  Laterality: N/A;  . SINUSOTOMY Right 02/13/2017   Procedure: [place];  Surgeon: Rozetta Nunnery, MD;  Location: [place];  Service: ENT;  Laterality: Right;  . TONSILLECTOMY     Social History   Tobacco Use  . Smoking status: Former Smoker    Quit date: 09/19/2003    Years since quitting: 16.1  . Smokeless tobacco: Never Used  . Tobacco comment: greater than 14 years ago  Substance Use Topics  . Alcohol use: Yes    Alcohol/week: 0.0 standard drinks    Comment: rarely    ROS  Review of Systems  Constitution: Negative for decreased appetite, malaise/fatigue, weight gain and weight loss.  Eyes: Negative for visual disturbance.  Cardiovascular: Positive for dyspnea on exertion and leg swelling. Negative for chest pain, claudication, orthopnea, palpitations and syncope.  Respiratory: Negative for hemoptysis and wheezing.   Endocrine: Negative for cold intolerance and heat intolerance.  Hematologic/Lymphatic: Does not bruise/bleed easily.  Skin: Negative for nail changes.  Musculoskeletal: Negative for muscle weakness and myalgias.  Gastrointestinal: Negative for abdominal pain, change in bowel habit, nausea and vomiting.  Neurological: Negative for difficulty with concentration, dizziness, focal weakness and headaches.  Psychiatric/Behavioral: Negative for altered mental status and suicidal ideas.  All other systems  reviewed and are negative.  Objective  Blood pressure 119/78, pulse (!) 59, temperature 97.7 F (36.5 C), height 5\' 4"  (1.626 m), weight 279 lb 3.2 oz (126.6 kg), SpO2 96 %.  Vitals with BMI 10/29/2019 08/01/2019 06/02/2019  Height 5\' 4"  - 5\' 4"   Weight 279 lbs 3 oz 274 lbs 13 oz 312 lbs 3 oz  BMI [id number] - [id number]  Systolic 123456 91 [id number]  Diastolic 78 54 64  Pulse 59 66 70    Physical Exam  Constitutional: She is oriented to person, place, and time. Vital signs are normal. She appears well-developed and well-nourished.  HENT:  Head:  Normocephalic and atraumatic.  Cardiovascular: Normal rate, regular rhythm and intact distal pulses.  Murmur heard.  Early systolic murmur is present with a grade of 2/6 at the upper right sternal border radiating to the neck. Pulses:      Carotid pulses are on the right side with bruit. Pulmonary/Chest: Effort normal and breath sounds normal. No accessory muscle usage. No respiratory distress.  Abdominal: Soft. Bowel sounds are normal.  Musculoskeletal:        General: Normal range of motion.     Cervical back: Normal range of motion.  Neurological: She is alert and oriented to person, place, and time.  Skin: Skin is warm and dry.  Vitals reviewed.  Laboratory examination:   Recent Labs    06/03/19 1515 06/24/19 1430 07/03/19 1451  NA 142 139 142  K 3.0* 2.6* 3.5  CL 99 94* 100  CO2 35* 32 32  GLUCOSE 112* 107* 97  BUN 16 17 12   CREATININE 0.97 0.83 0.80  CALCIUM 8.9 9.4 9.1  GFRNONAA >60 >60 >60  GFRAA >60 >60 >60   CrCl cannot be calculated (Patient's most recent lab result is older than the maximum 21 days allowed.).  CMP Latest Ref Rng & Units 07/03/2019 06/24/2019 06/03/2019  Glucose 70 - 99 mg/dL 97 107(H) 112(H)  BUN 6 - 20 mg/dL 12 17 16   Creatinine 0.44 - 1.00 mg/dL 0.80 0.83 0.97  Sodium 135 - 145 mmol/L 142 139 142  Potassium 3.5 - 5.1 mmol/L 3.5 2.6(LL) 3.0(LL)  Chloride 98 - 111 mmol/L 100 94(L) 99  CO2 22 - 32 mmol/L 32 32 35(H)  Calcium 8.9 - 10.3 mg/dL 9.1 9.4 8.9  Total Protein 6.5 - 8.1 g/dL - 7.7 7.2  Total Bilirubin 0.3 - 1.2 mg/dL - 0.6 0.5  Alkaline Phos 38 - 126 U/L - 62 74  AST 15 - 41 U/L - 17 15  ALT 0 - 44 U/L - 11 14   CBC Latest Ref Rng & Units 06/24/2019 06/03/2019 01/23/2019  WBC 4.0 - 10.5 K/uL 5.3 5.3 7.1  Hemoglobin 12.0 - 15.0 g/dL 13.0 12.8 12.6  Hematocrit 36.0 - 46.0 % 39.7 39.2 41.1  Platelets 150 - 400 K/uL 217 182 190   Lipid Panel     Component Value Date/Time   CHOL 173 12/18/2013 0709   TRIG 101 12/18/2013 0709   HDL  48 12/18/2013 0709   VLDL 20 12/18/2013 0709   LDLCALC 105 (H) 12/18/2013 0709   HEMOGLOBIN A1C Lab Results  Component Value Date   HGBA1C 6.9 (H) 02/07/2017   MPG 151 02/07/2017   TSH No results for input(s): TSH in the last 8760 hours.  External labs  Medications and allergies   Allergies  Allergen Reactions  . Penicillins Other (See Comments)    Has patient had a PCN reaction causing immediate rash, facial/tongue/throat  swelling, SOB or lightheadedness with hypotension: Unknown Has patient had a PCN reaction causing severe rash involving mucus membranes or skin necrosis: Unknown Has patient had a PCN reaction that required hospitalization: No Has patient had a PCN reaction occurring within the last 10 years: Unknown Patient states her MD informed her that she was allergic. If all of the above answers are "NO", then may proceed with [place]     Current Outpatient Medications  Medication Instructions  . acetaminophen (TYLENOL 8 HOUR) 650 mg, Oral, Every 8 hours PRN  . ARIPiprazole (ABILIFY) 15 mg, Oral, Daily at bedtime  . Dextran 70-Hypromellose (ARTIFICIAL TEARS) 0.1-0.3 % SOLN 1 drop 4 (four) times a day as needed (dry eyes).  [name] divalproex (DEPAKOTE) 500 mg, Oral, Daily at bedtime  . furosemide (LASIX) 80 mg, Oral, 2 times daily  . metFORMIN (GLUCOPHAGE) 500 mg, 2 times daily with meals  . potassium chloride SA (KLOR-CON) 20 MEQ tablet 3meq three times daily for 2 days then 5meq po twice daily for 1 week and f/u with PCP for further management of hypokalemia  . pseudoephedrine (SUDAFED) 120 mg, Oral, 2 times daily    Radiology:  No results found.  Cardiac Studies:   Echocardiogram 01/25/2017: Poor echo window, wall motion with reduced sensitivity. Left ventricle cavity is normal in size. Normal global wall motion. Visual EF is 55-60%. Unable to determine diastolic function due to lack of E/e'. D- Shaped septum in diastole suggests RV volume overload. Calculated  EF 58%. Left atrial cavity is mildly dilated by dimension. Right atrial cavity is mildly dilated. Right ventricle cavity is mild to moderately dilated. Poor visualization of the RV and function probably preserved. Mild calcification of the aortic valve annulus. Mild aortic valve stenosis. Aortic valve peak gradient of 10mmHg, mean gradient 10 mmHg, calculated aortic valve area by planimetry 1.33cm. Trace tricuspid regurgitation. Unable to estimate PA pressure due to absent/minimal TR jet.  Assessment     ICD-10-CM   1. Essential hypertension  I10 EKG 12-Lead    PCV ECHOCARDIOGRAM COMPLETE    EKG 12-Lead  2. Chronic diastolic (congestive) heart failure (HCC)  I50.32   3. Mild aortic stenosis  I35.0   4. Malignant neoplasm of maxillary sinus (HCC)  C31.0   5. Morbid obesity due to excess calories ([place])  E66.01     EKG 10/29/2019: Normal sinus rhythm at 73 bpm with 2 PAC's, left atrial enlargement, normal axis, diffuse ST-T wave abnormality.   No orders of the defined types were placed in this encounter.   Medications Discontinued During This Encounter  Medication Reason  . dexamethasone (DECADRON) 4 MG tablet Error  . docusate sodium (COLACE) 100 MG capsule Error  . HYDROcodone-acetaminophen (HYCET) 7.5-325 mg/15 ml solution Error  . lidocaine (XYLOCAINE) 2 % solution Error  . lidocaine-prilocaine (EMLA) cream Error  . ondansetron (ZOFRAN) 4 MG tablet Error  . ondansetron (ZOFRAN) 8 MG tablet Error  . prochlorperazine (COMPAZINE) 10 MG tablet Error  . sodium fluoride (PREVIDENT 5000 PLUS) 1.1 % CREA dental cream Error     Recommendations:   Patient is here for follow up on diastolic heart failure. She is now on Cisplatin for her right maxillary sinus SCC, dose has been reduced to 40 mg daily due to concern for nephrotoxicity.  She has intermittent leg swelling and stable dyspnea on exertion. I will recommend repeating echocardiogram as it has been since 2018 since her last echo.  Previously has mild aortic stenosis and normal LVEF. Depending upon these  results and how she is doing symptomatically, could potentially consider further decreasing the dose. Blood pressure is well controlled, continue with current medications. No clinical evidence of decompensated heart on exam. No changes are noted to aortic stenotic murmur by exam.   She has lost weight over the last few months since undergoing radiation, encouraged her to continue with this as this will likely help her diastolic dysfunction as well. She has also noticed some improvement in her energy levels. Will see her back after her echocardiogram for further recommendations.   Miquel Dunn, MSN, APRN, FNP-C [facility]. [place] Office: [phone] Fax: [phone]

## 2019-11-04 ENCOUNTER — Ambulatory Visit (HOSPITAL_COMMUNITY): Payer: Medicare Other

## 2019-11-05 ENCOUNTER — Other Ambulatory Visit: Payer: Medicaid Other

## 2019-11-06 ENCOUNTER — Encounter: Payer: Medicare Other | Admitting: Physical Therapy

## 2019-11-07 ENCOUNTER — Telehealth: Payer: Self-pay | Admitting: *Deleted

## 2019-11-07 NOTE — Telephone Encounter (Signed)
Oncology Nurse Navigator Documentation  Received follow-up call from [place], Dr. Janeece Agee office, [facility], with clarification of imaging requests.  She indicated Dr. Barton Dubois is OK with PET ordered by Dr. Isidore Moos, does not see the need for further imaging at this time.  [name], RN, BSN Head & Neck Oncology Nurse [place] at [place] [phone]

## 2019-11-10 ENCOUNTER — Telehealth: Payer: Self-pay | Admitting: *Deleted

## 2019-11-10 NOTE — Telephone Encounter (Signed)
Oncology Nurse Navigator Documentation  Spoke with Jodi [name].  Confirmed understanding:  Per Dr. Janeece Agee RN [place]n Pulse, PET ordered by Dr. Isidore Moos is sufficient, no additional scans needed as far as he is concerned.  She is to arrive to [facility] Radiology 1:30 tomorrow for 2:00 PET; no carbs for tonight's dinner, NPO after 8:00 tomorrow morning including gum/candy, can take meds including Metformin with small sips tomorrow before 8:00. She voiced understanding.  [name], RN, BSN Head & Neck Oncology Nurse [place] at [place] [phone]

## 2019-11-11 ENCOUNTER — Other Ambulatory Visit: Payer: Self-pay

## 2019-11-11 ENCOUNTER — Encounter (HOSPITAL_COMMUNITY): Payer: Self-pay

## 2019-11-11 ENCOUNTER — Ambulatory Visit (HOSPITAL_COMMUNITY)
Admission: RE | Admit: 2019-11-11 | Discharge: 2019-11-11 | Disposition: A | Discharge status: Home / Self Care | Payer: Medicare Other | Source: Ambulatory Visit | Attending: Radiation Oncology | Admitting: Radiation Oncology

## 2019-11-11 DIAGNOSIS — C310 Malignant neoplasm of maxillary sinus: Secondary | ICD-10-CM | POA: Insufficient documentation

## 2019-11-13 ENCOUNTER — Telehealth: Payer: Self-pay | Admitting: *Deleted

## 2019-11-13 NOTE — Telephone Encounter (Addendum)
Oncology Nurse Navigator Documentation  Spoke with [name]' dtr Jodi [name] re cancellation of Tuesday's PET.  She expressed concern she was not able to accompany her mother to Radiology despite having been previously told she would be able to.  She acknowledged explanation by [facility] Radiology staff but was unhappy and took her mother home.  I apologized for misunderstanding.  I informed her PET was rescheduled for 2:00 3/5 if her mother wished to proceed.  Jodi [name] indicated PET had been scheduled at [facility] on 3/3. I asked her to confirm with her mother where she wished to have PET conducted and call me with her decision.  She agreed to do so.  Notes:    2/24 0911 - Rec'd call from [facility] Dr. Janeece Agee RN Jodi [name] inquiring about cancelled PET.  I shared above.  She asked me to call her with date for rescheduled PET.    2/26 1534 - Rec'd VMM  from [place] stating she received call from [place] indicating 3/3 PET cannot be authorized until PET appt/authorization for 3/5 PET cancelled.  2/26 1553 - Navigator LVMM 1553 for [facility] asking for call-back to clarify location preference for PET.  2/26 2040 - Rec'd VMM from [facility] stating her mother prefers PET to be conducted at [facility], asked that [facility] PET be cancelled.  3/1 0745 - Rec'd call from [facility] in f/u to her VMM to confirm my receipt of her VMM.  I informed her I cancelled Friday's PET at [facility], will be calling [facility] to inform them.  3/1 [id number] - Spoke with [place], notified RN PET has been cancelled.  She asked that I call [place] to inform them.  3/1/ 0919 - Spoke with Zerita Boers, informed him [facility] PET cancelled.  He indicated [facility] PET pre-authorization is awaiting peer-to-peer with Dr. Barton Dubois, needed today for [facility] approval.  3/1 1012 - Spoke with RN [facility] Triage, informed her of above conversation with Jodi [name].  She indicated she will follow-up  on peer-to-peer.  3/1 1059 - LVMM for Jodi [name] with above information, encouraged call-back if questions.   Jodi Orem, RN, BSN Head & Neck Oncology Nurse [place] at [place] [phone]

## 2019-11-14 ENCOUNTER — Other Ambulatory Visit: Payer: Self-pay

## 2019-11-14 ENCOUNTER — Ambulatory Visit: Payer: Medicare Other

## 2019-11-14 DIAGNOSIS — I10 Essential (primary) hypertension: Secondary | ICD-10-CM

## 2019-11-18 ENCOUNTER — Ambulatory Visit: Payer: Medicaid Other | Admitting: Cardiology

## 2019-11-21 ENCOUNTER — Other Ambulatory Visit (HOSPITAL_COMMUNITY): Payer: Medicare Other

## 2019-11-21 ENCOUNTER — Encounter (HOSPITAL_COMMUNITY): Payer: Medicare Other

## 2019-11-25 ENCOUNTER — Encounter: Payer: Self-pay | Admitting: Cardiology

## 2019-11-25 ENCOUNTER — Other Ambulatory Visit: Payer: Self-pay

## 2019-11-25 ENCOUNTER — Telehealth: Payer: Medicare Other | Admitting: Cardiology

## 2019-11-25 VITALS — Ht 64.0 in | Wt 270.0 lb

## 2019-11-25 DIAGNOSIS — I10 Essential (primary) hypertension: Secondary | ICD-10-CM

## 2019-11-25 DIAGNOSIS — I5032 Chronic diastolic (congestive) heart failure: Secondary | ICD-10-CM

## 2019-11-25 DIAGNOSIS — I77810 Thoracic aortic ectasia: Secondary | ICD-10-CM

## 2019-11-25 MED ORDER — FUROSEMIDE 20 MG PO TABS: 20.0000 mg | ORAL_TABLET | ORAL | 1 refills | Status: DC

## 2019-11-25 NOTE — Progress Notes (Signed)
Primary Physician/Referring:  Bartholome Bill, MD  Patient ID: Jodi [name], female    DOB: [birth year], 59 y.o.   MRN: [id number]  Chief Complaint  Patient presents with  . Edema  . Follow-up   This visit type was conducted due to national recommendations for restrictions regarding the COVID-19 Pandemic (e.g. social distancing).  This format is felt to be most appropriate for this patient at this time.  All issues noted in this document were discussed and addressed.  No physical exam was performed (except for noted visual exam findings with Telehealth visits).  The patient has consented to conduct a Telehealth visit and understands insurance will be billed.   I discussed the limitations of evaluation and management by telemedicine and the availability of in person appointments. The patient expressed understanding and agreed to proceed.  Virtual Visit via Video Note is as below  I connected with Jodi [name], on 11/25/19 at 1545 by telephone and verified that I am speaking with the correct person using two identifiers. Unable to perform video visit as patient did not have equipment.    I have discussed with the patient regarding the safety during COVID Pandemic and steps and precautions including social distancing with the patient.    HPI:    Jodi [name]  is a 59 y.o. 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 Harriette Ohara), underwent echocardiogram and now presents to discuss results.  She is doing well, no complaints. Has leg swelling and dyspnea and has been stable. Dyspnea improved with losing weight unless she walks long distances.   She is currently taking Lasix 40 mg daily, but occasionally misses this. Has worsening leg swelling when she misses several days.     Past Medical History:  Diagnosis Date  . Anginal pain ([place])   . Atypical chest pain   . Bipolar disorder ([place])   . Cancer of nasal cavities ([place])    2018  . CHF (congestive heart failure) ([place])   . Chronic back pain   . Chronic knee pain   . Cor pulmonale (chronic) ([place])   . Depression   . Diabetes mellitus    Type II  . Dyspnea    with fluid retention; with activity  . Headache   . Hyperlipemia   . Malignant neoplasm of maxillary sinus ([place]) 02/13/2017  . Moderate to severe pulmonary hypertension ([place])   . Morbid obesity ([place])   . Obesity hypoventilation syndrome ([place])   . On home oxygen therapy    PRN  . Pickwickian syndrome ([place])   . Thoracic aortic aneurysm [facility])    Past Surgical History:  Procedure Laterality Date  . RIGHT OOPHORECTOMY    . SINUS ENDO WITH FUSION N/A 02/13/2017   Procedure: ENDOSCOPIC SINUS ENDO WITH FUSION;  Surgeon: Rozetta Nunnery, MD;  Location: [place] [facility];  Service: ENT;  Laterality: N/A;  . SINUSOTOMY Right 02/13/2017   Procedure: [place];  Surgeon: Rozetta Nunnery, MD;  Location: [place];  Service: ENT;  Laterality: Right;  . TONSILLECTOMY     Social History   Tobacco Use  . Smoking status: Former Smoker    Quit date: 09/19/2003    Years since quitting: 16.1  . Smokeless tobacco: Never Used  . Tobacco comment: greater than 14 years ago  Substance Use Topics  . Alcohol use: Yes    Alcohol/week: 0.0 standard drinks    Comment: rarely    ROS  Review of Systems  Constitution: Negative for decreased appetite, malaise/fatigue, weight gain and weight loss.  Eyes: Negative for visual disturbance.  Cardiovascular: Positive for dyspnea on exertion and leg swelling. Negative for chest pain, claudication, orthopnea, palpitations and syncope.  Respiratory: Negative for hemoptysis and wheezing.   Endocrine: Negative for cold intolerance and heat intolerance.  Hematologic/Lymphatic: Does not bruise/bleed easily.  Skin:  Negative for nail changes.  Musculoskeletal: Negative for muscle weakness and myalgias.  Gastrointestinal: Negative for abdominal pain, change in bowel habit, nausea and vomiting.  Neurological: Negative for difficulty with concentration, dizziness, focal weakness and headaches.  Psychiatric/Behavioral: Negative for altered mental status and suicidal ideas.  All other systems reviewed and are negative.  Objective  Height 5\' 4"  (1.626 m), weight 270 lb (122.5 kg).  Vitals with BMI 11/25/2019 10/29/2019 08/01/2019  Height 5\' 4"  5\' 4"  -  Weight 270 lbs 279 lbs 3 oz 274 lbs 13 oz  BMI 99991111 [id number] -  Systolic - 123456 91  Diastolic - 78 54  Pulse - 59 66    Physical exam not performed or limited due to virtual visit.    Please see exam details from prior visit is as below.   Physical Exam  Constitutional: She is oriented to person, place, and time. Vital signs are normal. She appears well-developed and well-nourished.  HENT:  Head: Normocephalic and atraumatic.  Cardiovascular: Normal rate, regular rhythm and intact distal pulses.  Murmur heard.  Early systolic murmur is present with a grade of 2/6 at the upper right sternal border radiating to the neck. Pulses:      Carotid pulses are on the right side with bruit. Pulmonary/Chest: Effort normal and breath sounds normal. No accessory muscle usage. No respiratory distress.  Abdominal: Soft. Bowel sounds are normal.  Musculoskeletal:        General: Normal range of motion.     Cervical back: Normal range of motion.  Neurological: She is alert and oriented to person, place, and time.  Skin: Skin is warm and dry.  Vitals reviewed.  Laboratory examination:   Recent Labs    06/03/19 1515 06/24/19 1430 07/03/19 1451  NA 142 139 142  K 3.0* 2.6* 3.5  CL 99 94* 100  CO2 35* 32 32  GLUCOSE 112* 107* 97  BUN 16 17 12   CREATININE 0.97 0.83 0.80  CALCIUM 8.9 9.4 9.1  GFRNONAA >60 >60 >60  GFRAA >60 >60 >60   CrCl cannot be calculated  (Patient's most recent lab result is older than the maximum 21 days allowed.).  CMP Latest Ref Rng & Units 07/03/2019 06/24/2019 06/03/2019  Glucose 70 - 99 mg/dL 97 107(H) 112(H)  BUN 6 - 20 mg/dL 12 17 16   Creatinine 0.44 - 1.00 mg/dL 0.80 0.83 0.97  Sodium 135 - 145 mmol/L 142 139 142  Potassium 3.5 - 5.1 mmol/L 3.5 2.6(LL) 3.0(LL)  Chloride 98 - 111 mmol/L 100 94(L) 99  CO2 22 - 32 mmol/L 32 32 35(H)  Calcium 8.9 - 10.3 mg/dL 9.1 9.4 8.9  Total Protein 6.5 - 8.1 g/dL - 7.7 7.2  Total Bilirubin 0.3 - 1.2 mg/dL - 0.6 0.5  Alkaline Phos 38 - 126 U/L - 62 74  AST 15 - 41 U/L - 17 15  ALT 0 - 44 U/L - 11 14   CBC Latest Ref Rng &  Units 06/24/2019 06/03/2019 01/23/2019  WBC 4.0 - 10.5 K/uL 5.3 5.3 7.1  Hemoglobin 12.0 - 15.0 g/dL 13.0 12.8 12.6  Hematocrit 36.0 - 46.0 % 39.7 39.2 41.1  Platelets 150 - 400 K/uL 217 182 190   Lipid Panel     Component Value Date/Time   CHOL 173 12/18/2013 0709   TRIG 101 12/18/2013 0709   HDL 48 12/18/2013 0709   VLDL 20 12/18/2013 0709   LDLCALC 105 (H) 12/18/2013 0709   HEMOGLOBIN A1C Lab Results  Component Value Date   HGBA1C 6.9 (H) 02/07/2017   MPG 151 02/07/2017   TSH No results for input(s): TSH in the last 8760 hours.  External labs  Medications and allergies   Allergies  Allergen Reactions  . Penicillins Other (See Comments)    Has patient had a PCN reaction causing immediate rash, facial/tongue/throat swelling, SOB or lightheadedness with hypotension: Unknown Has patient had a PCN reaction causing severe rash involving mucus membranes or skin necrosis: Unknown Has patient had a PCN reaction that required hospitalization: No Has patient had a PCN reaction occurring within the last 10 years: Unknown Patient states her MD informed her that she was allergic. If all of the above answers are "NO", then may proceed with [place]     Current Outpatient Medications  Medication Instructions  . acetaminophen (TYLENOL) 500 mg, Oral, Every  6 hours PRN  . ARIPiprazole (ABILIFY) 15 mg, Oral, Daily at bedtime  . Dextran 70-Hypromellose (ARTIFICIAL TEARS) 0.1-0.3 % SOLN 1 drop 4 (four) times a day as needed (dry eyes).  . furosemide (LASIX) 20 mg, Oral, As directed, Take 1 tablet in the morning. Can take additional dose if needed for leg swelling and shortness of breath  . metFORMIN (GLUCOPHAGE) 500 mg, 2 times daily with meals  . potassium chloride SA (KLOR-CON) 20 MEQ tablet 32meq three times daily for 2 days then 70meq po twice daily for 1 week and f/u with PCP for further management of hypokalemia    Radiology:  No results found.  Cardiac Studies:   Echocardiogram 01/25/2017: Poor echo window, wall motion with reduced sensitivity. Left ventricle cavity is normal in size. Normal global wall motion. Visual EF is 55-60%. Unable to determine diastolic function due to lack of E/e'. D- Shaped septum in diastole suggests RV volume overload. Calculated EF 58%. Left atrial cavity is mildly dilated by dimension. Right atrial cavity is mildly dilated. Right ventricle cavity is mild to moderately dilated. Poor visualization of the RV and function probably preserved. Mild calcification of the aortic valve annulus. Mild aortic valve stenosis. Aortic valve peak gradient of 60mmHg, mean gradient 10 mmHg, calculated aortic valve area by planimetry 1.33cm. Trace tricuspid regurgitation. Unable to estimate PA pressure due to absent/minimal TR jet.  Echocardiogram 11/14/2019:  1. Normal LV systolic function with EF 66%. Left ventricle cavity is  normal in size. Normal global wall motion. Normal diastolic filling  pattern. Mild left ventricular hypertrophy. No obvious regional wall  motion abnormalities.  2. Right ventricle cavity visually appears mildly dilated and visually  preserved systolic function.  3. Mild mitral regurgitation.  4. Mild tricuspid regurgitation. Mild pulmonary hypertension. RVSP  measures 39 mmHg.  5. The aortic  root is dilated. Proximal ascending aorta measures at 3.8cm.  6. Prior study 01/25/2017: Poor echo window, normal global wall motion.  Visual EF is 55-60%. Left atrial cavity is mildly dilated by dimension.  Right atrial cavity is mildly dilated. Right ventricle cavity is mild to  moderately  dilated. Mild aortic valve stenosis. Aortic valve peak gradient  of 67mmHg, mean gradient 10 mmHg, calculated aortic valve area by  planimetry 1.33cm. Trace tricuspid regurgitation. Unable to estimate PA  pressure due to absent/minimal TR jet.  Assessment   No diagnosis found.  EKG 10/29/2019: Normal sinus rhythm at 73 bpm with 2 PAC's, left atrial enlargement, normal axis, diffuse ST-T wave abnormality.   Meds ordered this encounter  Medications  . furosemide (LASIX) 20 MG tablet    Sig: Take 1 tablet (20 mg total) by mouth as directed. Take 1 tablet in the morning. Can take additional dose if needed for leg swelling and shortness of breath    Dispense:  90 tablet    Refill:  1    Order Specific Question:   Supervising Provider    Answer:   Adrian Prows [[id number]]    Medications Discontinued During This Encounter  Medication Reason  . acetaminophen (TYLENOL 8 HOUR) 650 MG CR tablet Patient Preference  . pseudoephedrine (SUDAFED) 120 MG 12 hr tablet Patient Preference  . divalproex (DEPAKOTE) 500 MG DR tablet Patient Preference  . furosemide (LASIX) 40 MG tablet      Recommendations:   Kimberlyn Marcilla Staber  is a 59 y.o. 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  I have reviewed and discussed her recent echocardiogram, has normal LVEF and normal diastolic filling pattern. Mild pulmonary hypertension likely secondary to her weight. She  does have mild aortic root dilation that is new compared to her previous echo. Would recommend repeating her echo in 6 months to 1 year for surveillance. Overall her symptoms have improved since she was originally seen by [place] in 2018 with now 40 lb weight loss. I have urged her to continue with this. Kidney function is stable by PCP labs. As she has improvement had improvement in her symptoms over the last few years and occasionally misses her current dose, can further reduce her dose to 20 mg daily. May take an additional dose as needed. I will recommend seeing her back in 2-3 months for follow up on her symptoms.   Miquel Dunn, MSN, APRN, FNP-C [facility]. [place] Office: [phone] Fax: [phone]

## 2020-01-26 ENCOUNTER — Ambulatory Visit
Admission: RE | Admit: 2020-01-26 | Discharge: 2020-01-26 | Disposition: A | Discharge status: Home / Self Care | Payer: Self-pay | Source: Ambulatory Visit | Attending: Radiation Oncology | Admitting: Radiation Oncology

## 2020-01-26 ENCOUNTER — Other Ambulatory Visit: Payer: Self-pay

## 2020-01-26 DIAGNOSIS — C310 Malignant neoplasm of maxillary sinus: Secondary | ICD-10-CM

## 2020-01-26 NOTE — Progress Notes (Signed)
Oncology Nurse Navigator Documentation  I spoke with [facility] Radiology regarding having Jodi [name] scans that were performed on 11/19/19 at [facility] pushed to [facility] for access at [facility]   Orders have been placed in Epic for the scans that have been pushed to [place] to be assigned to [place], RN, BSN, [place] at [place] [phone]

## 2020-02-25 ENCOUNTER — Ambulatory Visit: Payer: Medicare Other | Admitting: Cardiology

## 2020-02-27 ENCOUNTER — Ambulatory Visit
Admission: RE | Admit: 2020-02-27 | Discharge: 2020-02-27 | Disposition: A | Discharge status: Home / Self Care | Payer: Self-pay | Source: Ambulatory Visit | Attending: Radiation Oncology | Admitting: Radiation Oncology

## 2020-02-27 ENCOUNTER — Other Ambulatory Visit: Payer: Self-pay

## 2020-02-27 ENCOUNTER — Telehealth: Payer: Self-pay | Admitting: *Deleted

## 2020-02-27 DIAGNOSIS — C310 Malignant neoplasm of maxillary sinus: Secondary | ICD-10-CM

## 2020-02-27 NOTE — Progress Notes (Signed)
Oncology Nurse Navigator Documentation  I placed a call to the imaging sharing department at [facility] to request CT maxillofacial, CT chest, CT neck from 02/25/20 be sent to [place] for Dr. Pearlie Oyster review. I was informed that the images would be sent. Order placed in Epic for these scans to be processed.    Harlow Asa, RN, BSN, OCN Head & Neck Oncology Nurse [place] at [place] [phone]

## 2020-02-27 NOTE — Telephone Encounter (Signed)
Called patient's daughter to ask about scheduling fu appt. for her mom, Jodi [name] with Dr. Isidore Moos, lvm for a return call

## 2020-06-04 ENCOUNTER — Other Ambulatory Visit: Payer: Self-pay | Admitting: Registered Nurse

## 2020-06-04 DIAGNOSIS — Z1231 Encounter for screening mammogram for malignant neoplasm of breast: Secondary | ICD-10-CM

## 2020-07-07 ENCOUNTER — Ambulatory Visit: Payer: Medicare Other

## 2020-09-28 ENCOUNTER — Ambulatory Visit: Payer: Medicare Other

## 2020-12-23 ENCOUNTER — Other Ambulatory Visit: Payer: Self-pay

## 2020-12-23 MED ORDER — FUROSEMIDE 20 MG PO TABS: 20.0000 mg | ORAL_TABLET | ORAL | 0 refills | Status: DC

## 2021-01-23 ENCOUNTER — Other Ambulatory Visit: Payer: Self-pay | Admitting: Cardiology

## 2021-12-28 ENCOUNTER — Encounter: Payer: Self-pay | Admitting: Hematology

## 2021-12-28 ENCOUNTER — Ambulatory Visit (HOSPITAL_COMMUNITY)
Admission: EM | Admit: 2021-12-28 | Discharge: 2021-12-28 | Disposition: A | Discharge status: Home / Self Care | Payer: Medicare HMO | Attending: Family Medicine | Admitting: Family Medicine

## 2021-12-28 ENCOUNTER — Encounter (HOSPITAL_COMMUNITY): Payer: Self-pay | Admitting: *Deleted

## 2021-12-28 ENCOUNTER — Other Ambulatory Visit: Payer: Self-pay

## 2021-12-28 DIAGNOSIS — R519 Headache, unspecified: Secondary | ICD-10-CM | POA: Diagnosis not present

## 2021-12-28 DIAGNOSIS — L03115 Cellulitis of right lower limb: Secondary | ICD-10-CM | POA: Diagnosis not present

## 2021-12-28 DIAGNOSIS — K140 Glossitis: Secondary | ICD-10-CM | POA: Diagnosis not present

## 2021-12-28 LAB — CBG MONITORING, ED: Glucose-Capillary: 75 mg/dL (ref 70–99)

## 2021-12-28 MED ORDER — CLINDAMYCIN HCL 300 MG PO CAPS: 300.0000 mg | ORAL_CAPSULE | Freq: Three times a day (TID) | ORAL | 0 refills | Status: AC

## 2021-12-28 NOTE — ED Provider Notes (Signed)
?[place] ? ? ? ?CSN: [id number] ?Arrival date & time: 12/28/21  1831 ? ? ?  ? ?History   ?Chief Complaint ?Chief Complaint  ?Patient presents with  ? Headache  ? ? ?HPI ?Jodi [name] is a 61 y.o. female.  ? ? ?Headache ?Here for several things: ? ?H/a recently, frontal. Ibuprofen and tylenol have helped, but h/a comes back. ? ?No recent fever. ? ?Has a sore on her tongue for about a week. No h/o smoking. ? ?Has a lump on her shin on the right leg, since she bumped it. ? ?States has been off metformin for a month, and wants to make sure sugars are ok. When I ask her if she has a pcp, she states no, but [address] just took her off the metformin. ? ?Past Medical History:  ?Diagnosis Date  ? Anginal pain ([place])   ? Atypical chest pain   ? Bipolar disorder ([place])   ? Cancer of nasal cavities (HCC)   ? 2018  ? CHF (congestive heart failure) ([place])   ? Chronic back pain   ? Chronic knee pain   ? Cor pulmonale (chronic) (H[facility])   ? Depression   ? Diabetes mellitus   ? Type II  ? Dyspnea   ? with fluid retention; with activity  ? Headache   ? Hyperlipemia   ? Malignant neoplasm of maxillary sinus ([place]) 02/13/2017  ? Moderate to severe pulmonary hypertension (H[facility])   ? Morbid obesity ([place])   ? Obesity hypoventilation syndrome ([place])   ? On home oxygen therapy   ? PRN  ? Pickwickian syndrome ([place])   ? Thoracic aortic aneurysm ([place])   ? ? ?Patient Active Problem List  ? Diagnosis Date Noted  ? Counseling regarding advance care planning and goals of care 06/05/2019  ? Goals of care, counseling/discussion 01/17/2019  ? Malignant neoplasm of maxillary sinus ([place]) 02/13/2017  ? Bipolar disorder, in partial remission, most recent episode manic ([place])   ? Pulmonary hypertension ([place])   ? Acute respiratory failure with hypoxia and hypercarbia (HCC)   ? Morbid obesity due to excess calories ([place])   ? Nocturnal hypoxemia due to obesity   ? Acute on chronic diastolic (congestive) heart failure ([place]) 12/03/2015  ? CHF  (congestive heart failure) ([place]) 11/18/2015  ? Non compliance with medical treatment 09/16/2015  ? Bipolar disorder ([place]) 09/15/2015  ? Type 2 diabetes mellitus ([place]) 09/15/2015  ? Morbid obesity ([place]) 09/15/2015  ? ? ?Past Surgical History:  ?Procedure Laterality Date  ? RIGHT OOPHORECTOMY    ? SINUS ENDO WITH FUSION N/A 02/13/2017  ? Procedure: ENDOSCOPIC SINUS ENDO WITH FUSION;  Surgeon: Rozetta Nunnery, MD;  Location: [facility] [place];  Service: ENT;  Laterality: N/A;  ? SINUSOTOMY Right 02/13/2017  ? Procedure: SINUSOTOMY VIA CALDWELL LUC;  Surgeon: Rozetta Nunnery, MD;  Location: [place];  Service: ENT;  Laterality: Right;  ? TONSILLECTOMY    ? ? ?OB History   ?No obstetric history on file. ?  ? ? ? ?Home Medications   ? ?Prior to Admission medications   ?Medication Sig Start Date End Date Taking? Authorizing Provider  ?clindamycin (CLEOCIN) 300 MG capsule Take 1 capsule (300 mg total) by mouth 3 (three) times daily for 7 days. 12/28/21 01/04/22 Yes Resean Brander, Gwenlyn Perking, MD  ?acetaminophen (TYLENOL) 500 MG tablet Take 500 mg by mouth every 6 (six) hours as needed.    [provider]  ?ARIPiprazole (ABILIFY) 15 MG tablet Take 15  mg by mouth at bedtime.    [provider]  ?Dextran 70-Hypromellose (ARTIFICIAL TEARS) 0.1-0.3 % SOLN 1 drop 4 (four) times a day as needed (dry eyes).    [provider]  ?furosemide (LASIX) 20 MG tablet Take 1 tablet (20 mg total) by mouth as directed. Take 1 tablet in the morning. Can take additional dose if needed for leg swelling and shortness of breath 12/23/20 03/23/21  Adrian Prows, MD  ?metFORMIN (GLUCOPHAGE) 500 MG tablet Take 500 mg by mouth 2 (two) times daily with a meal.    [provider]  ?potassium chloride SA (KLOR-CON) 20 MEQ tablet 5mq three times daily for 2 days then 483m po twice daily for 1 week and f/u with PCP for further management of hypokalemia ?Patient taking differently: Take 20 mEq by mouth 2 (two) times daily. management of  hypokalemia 06/24/19   KaBrunetta GeneraMD  ? ? ?Family History ?Family History  ?Problem Relation Age of Onset  ? Cancer Father   ? Diabetes Other   ? Stroke Mother   ? Diabetes Mother   ? ? ?Social History ?Social History  ? ?Tobacco Use  ? Smoking status: Former  ?  Types: Cigarettes  ?  Quit date: 09/19/2003  ?  Years since quitting: 18.2  ? Smokeless tobacco: Never  ? Tobacco comments:  ?  greater than 14 years ago  ?Vaping Use  ? Vaping Use: Never used  ?Substance Use Topics  ? Alcohol use: Yes  ?  Alcohol/week: 0.0 standard drinks  ?  Comment: rarely  ? Drug use: Never  ? ? ? ?Allergies   ?Penicillins ? ? ?Review of Systems ?Review of Systems  ?Neurological:  Positive for headaches.  ? ? ?Physical Exam ?Triage Vital Signs ?ED Triage Vitals  ?Enc Vitals Group  ?   BP 12/28/21 1955 (!) 154/81  ?   Pulse Rate 12/28/21 1955 (!) 52  ?   Resp 12/28/21 1955 18  ?   Temp 12/28/21 1955 97.9 ?F (36.6 ?C)  ?   Temp src --   ?   SpO2 12/28/21 1955 96 %  ?   Weight --   ?   Height --   ?   Head Circumference --   ?   Peak Flow --   ?   Pain Score 12/28/21 1952 7  ?   Pain Loc --   ?   Pain Edu? --   ?   Excl. in [place]--   ? ?No data found. ? ?Updated Vital Signs ?BP (!) 154/81   Pulse (!) 52   Temp 97.9 ?F (36.6 ?C)   Resp 18   SpO2 96%  ? ?Visual Acuity ?Right Eye Distance:   ?Left Eye Distance:   ?Bilateral Distance:   ? ?Right Eye Near:   ?Left Eye Near:    ?Bilateral Near:    ? ?Physical Exam ?HENT:  ?   Mouth/Throat:  ?   Mouth: Mucous membranes are moist.  ?   Comments: There is a raised white lesion with ulcer in middle on left middle of tongue. No dc. No surrounding erythema ?Cardiovascular:  ?   Rate and Rhythm: Normal rate and regular rhythm.  ?   Heart sounds: No murmur heard. ?Pulmonary:  ?   Effort: Pulmonary effort is normal.  ?   Breath sounds: Normal breath sounds.  ?Musculoskeletal:  ?   Comments: There is some induration about 3 cm diameter, poss resolving hematoma,  vs cellulitis. There is some mild  erythema too. Over the mid right shin. No ulceration there  ?Skin: ?   Capillary Refill: Capillary refill takes less than 2 seconds.  ?   Coloration: Skin is not jaundiced or pale.  ?Neurological:  ?   General: No focal deficit present.  ?   Mental Status: She is alert and oriented to person, place, and time.  ?Psychiatric:     ?   Behavior: Behavior normal.  ? ? ? ?UC Treatments / Results  ?Labs ?(all labs ordered are listed, but only abnormal results are displayed) ?Labs Reviewed  ?CBG MONITORING, ED  ? ? ?EKG ? ? ?Radiology ?No results found. ? ?Procedures ?Procedures (including critical care time) ? ?Medications Ordered in UC ?Medications - No data to display ? ?Initial Impression / Assessment and Plan / UC Course  ?I have reviewed the triage vital signs and the nursing notes. ? ?Pertinent labs & imaging results that were available during my care of the patient were reviewed by me and considered in my medical decision making (see chart for details). ? ?  ? ?Sugar is 75. We are asking her to f/u with [facility], her primary care clinic, for all these issues. ? ?To ENT if not improving with the tongue sore ? ?Antibiotics for the tongue and ?cellulitis leg. ?Final Clinical Impressions(s) / UC Diagnoses  ? ?Final diagnoses:  ?Nonintractable headache, unspecified chronicity pattern, unspecified headache type  ?Tongue ulcer  ?Cellulitis of right lower extremity  ? ? ? ?Discharge Instructions   ? ?  ?Your sugar was 75. ? ?Take tylenol or ibuprofen as needed for the headache. ? ?Take clindamycin 300 mg 1 capsule 3 times daily for 7 days. See ENT if the tongue spot does not improve. ? ?[facility] is your primary care clinic. Go see them soon about these issues. ? ? ? ? ?ED Prescriptions   ? ? Medication Sig Dispense Auth. Provider  ? clindamycin (CLEOCIN) 300 MG capsule Take 1 capsule (300 mg total) by mouth 3 (three) times daily for 7 days. 21 capsule Barrett Henle, MD  ? ?  ? ?PDMP not reviewed this  encounter. ?  ?Barrett Henle, MD ?12/28/21 2034 ? ?

## 2021-12-28 NOTE — ED Triage Notes (Signed)
Pt reports a Ha,pain to Lt lateral tongue. Pt also reports she hit her leg about a week ago and has a place on anterior RLE. Pt also reports she needs her blood sugar checked. ?

## 2021-12-28 NOTE — Discharge Instructions (Signed)
Your sugar was 75. ? ?Take tylenol or ibuprofen as needed for the headache. ? ?Take clindamycin 300 mg 1 capsule 3 times daily for 7 days. See ENT if the tongue spot does not improve. ? ?[facility] is your primary care clinic. Go see them soon about these issues. ?

## 2022-02-26 ENCOUNTER — Encounter (HOSPITAL_COMMUNITY): Payer: Self-pay

## 2022-02-26 ENCOUNTER — Ambulatory Visit (INDEPENDENT_AMBULATORY_CARE_PROVIDER_SITE_OTHER)
Admission: EM | Admit: 2022-02-26 | Discharge: 2022-02-26 | Disposition: A | Discharge status: Home / Self Care | Payer: Medicare HMO | Source: Home / Self Care

## 2022-02-26 ENCOUNTER — Emergency Department (HOSPITAL_COMMUNITY)
Admission: EM | Admit: 2022-02-26 | Discharge: 2022-02-26 | Disposition: A | Discharge status: Home / Self Care | Payer: Medicare HMO | Attending: Emergency Medicine | Admitting: Emergency Medicine

## 2022-02-26 ENCOUNTER — Other Ambulatory Visit: Payer: Self-pay

## 2022-02-26 ENCOUNTER — Encounter: Payer: Self-pay | Admitting: Hematology

## 2022-02-26 DIAGNOSIS — Z91199 Patient's noncompliance with other medical treatment and regimen due to unspecified reason: Secondary | ICD-10-CM

## 2022-02-26 DIAGNOSIS — Z91148 Patient's other noncompliance with medication regimen for other reason: Secondary | ICD-10-CM | POA: Insufficient documentation

## 2022-02-26 DIAGNOSIS — F319 Bipolar disorder, unspecified: Secondary | ICD-10-CM | POA: Insufficient documentation

## 2022-02-26 DIAGNOSIS — F99 Mental disorder, not otherwise specified: Secondary | ICD-10-CM | POA: Diagnosis present

## 2022-02-26 DIAGNOSIS — F489 Nonpsychotic mental disorder, unspecified: Secondary | ICD-10-CM

## 2022-02-26 DIAGNOSIS — R7309 Other abnormal glucose: Secondary | ICD-10-CM | POA: Diagnosis not present

## 2022-02-26 DIAGNOSIS — F0630 Mood disorder due to known physiological condition, unspecified: Secondary | ICD-10-CM

## 2022-02-26 DIAGNOSIS — R462 Strange and inexplicable behavior: Secondary | ICD-10-CM | POA: Diagnosis not present

## 2022-02-26 LAB — CBG MONITORING, ED: Glucose-Capillary: 119 mg/dL — ABNORMAL HIGH (ref 70–99)

## 2022-02-26 MED ORDER — DIVALPROEX SODIUM 500 MG PO DR TAB: 500.0000 mg | DELAYED_RELEASE_TABLET | Freq: Every evening | ORAL | 0 refills | Status: DC

## 2022-02-26 MED ORDER — ARIPIPRAZOLE 15 MG PO TABS: 15.0000 mg | ORAL_TABLET | Freq: Every day | ORAL | 0 refills | Status: AC

## 2022-02-26 NOTE — ED Provider Notes (Signed)
Behavioral Health Urgent Care Medical Screening Exam  Patient Name: Jodi [name] MRN: [id number] Date of Evaluation: 02/26/22 Chief Complaint:   Diagnosis:  Final diagnoses:  Mood disorder in conditions classified elsewhere  Non compliance with medical treatment    History of Present illness: Jodi [name] is a 61 y.o. female presents to [facility] urgent care accompanied with her daughter.  Jodi [name]'s daughter is requesting for patient to be admitted for inpatient due to medication noncompliance.  Reports patient has been "acting strange". She reports "I been dealing with this for 29 years and is not getting any better."  She was asked for examples of patient's behavior. Her daughter stated " her purse is heavy, who knows what's in it?."  Jodi [name] is awake, alert and oriented x3.  Observed sitting in wheelchair leaning to the right.  Patient was asked reason for this assessment she states " you need to ask my daughter."  she denied suicidal or homicidal ideations. Stated " I love me."   Denies auditory visual hallucinations.  Denies that she is being followed by therapy or psychiatry recently.  Charted history with bipolar disorder and depression. - Patent's daughter left the assessment abruptly after this practitioner suggested outpatient resources for medication management.  During evaluation Jodi [name] is sitting in no acute distress. She is alert/oriented x 3; calm/cooperative; and mood congruent with affect, slightly irritable with admission process.  She is speaking in a clear tone at moderate volume, and normal pace; with good eye contact. Her thought process is coherent and relevant; There is no indication that she is currently responding to internal/external stimuli or experiencing delusional thought content; and she has denied suicidal/self-harm/homicidal ideation, psychosis, and paranoia. Patient has remained calm throughout assessment and has answered questions  appropriately.     At this time Jodi [name] is educated and verbalizes understanding of mental health resources and other crisis services in the community. She is instructed to call 911 and present to the nearest emergency room should she experience any suicidal/homicidal ideation, auditory/visual/hallucinations, or detrimental worsening of her mental health condition. She was a also advised by Probation officer that she could call the toll-free phone on insurance card to assist with identifying in network counselors and agencies or number on back of Medicaid card to speak with care coordinator.     Psychiatric Specialty Exam  Presentation  General Appearance:Appropriate for Environment  Eye Contact:Good  Speech:Clear and Coherent  Speech Volume:Normal  Handedness:Right   Mood and Affect  Mood:Anxious  Affect:Congruent   Thought Process  Thought Processes:Coherent  Descriptions of Associations:Intact  Orientation:Full (Time, Place and Person)  Thought Content:No data recorded   Hallucinations:None  Ideas of Reference:None  Suicidal Thoughts:No  Homicidal Thoughts:No   Sensorium  Memory:Immediate Good; Remote Good; Recent Good  Judgment:Fair  Insight:Fair   Executive Functions  Concentration:Fair  Attention Span:Fair  [place]   Psychomotor Activity  Psychomotor Activity:Other (comment) (Wheelchair assisted)   Assets  Assets:No data recorded  Sleep  Sleep:No data recorded Number of hours: No data recorded  No data recorded  Physical Exam: Physical Exam ROS There were no vitals taken for this visit. There is no height or weight on file to calculate BMI.  Musculoskeletal: Strength & Muscle Tone: decreased Gait & Station: unsteady Patient leans: Right   [facility] Discharge Disposition for Follow up and Recommendations: Based on my evaluation the patient does not appear to have an emergency medical  condition and can be  discharged with resources and follow up care in outpatient services for Medication Management-  Attempted to provide additional outpatient resources however daughter left assessment abruptly   Derrill [place], NP 02/26/2022, 6:31 PM

## 2022-02-26 NOTE — Discharge Instructions (Signed)
Take all medications as prescribed. Keep all follow-up appointments as scheduled.  Do not consume alcohol or use illegal drugs while on prescription medications. Report any adverse effects from your medications to your primary care provider promptly.  In the event of recurrent symptoms or worsening symptoms, call 911, a crisis hotline, or go to the nearest emergency department for evaluation.   

## 2022-02-26 NOTE — ED Triage Notes (Signed)
Pt presents to ED from home with c/o manic behavior that began Wednesday. Pt's family states she has not been sleeping, exuding impulsive thoughts and behavior.  Family also states pt has not been taking her mental health medications x 1 year.

## 2022-02-26 NOTE — Discharge Instructions (Addendum)
It was a pleasure taking care of you today!   You will be sent refills of your abilify and depakote, take as directed. It is important that you call your primary care provider to set up a follow-up appointment regarding today's ED visit.  Attached is information for the behavioral health urgent care, you may go there as needed for your symptoms.  Return to the emergency department as needed.

## 2022-02-26 NOTE — ED Provider Notes (Signed)
[facility] DEPT Provider Note   CSN: [id number] Arrival date & time: 02/26/22  1913     History  Chief Complaint  Patient presents with   Girard is a 61 y.o. female with a past medical history of bipolar disorder who presents to the emergency department accompanied by her daughter with concerns for mental health problem onset today.  Patient denies SI, HI, auditory, visual hallucinations.  Daughter notes that patient has not been taking her medications as prescribed for several years now and she would like the patient to be placed back on her medications today.  Daughter notes that patient has not been sleeping, acting irrationally, impulsive thoughts.  Per patient chart review: Patient was evaluated at behavioral health urgent care center today and was discharged due to there being no emergent need found at the time.  When it came time for discharge, nurse practitioner attempted to provide patient with resources however patient's daughter abruptly left the room with the patient.  The history is provided by the patient and a relative. No language interpreter was used.       Home Medications Prior to Admission medications   Medication Sig Start Date End Date Taking? Authorizing Provider  divalproex (DEPAKOTE) 500 MG DR tablet Take 1 tablet (500 mg total) by mouth at bedtime. 02/26/22  Yes Assata Juncaj A, PA-C  acetaminophen (TYLENOL) 500 MG tablet Take 500 mg by mouth every 6 (six) hours as needed.    [provider]  ARIPiprazole (ABILIFY) 15 MG tablet Take 1 tablet (15 mg total) by mouth at bedtime. 02/26/22   Charli Halle A, PA-C  Dextran 70-Hypromellose (ARTIFICIAL TEARS) 0.1-0.3 % SOLN 1 drop 4 (four) times a day as needed (dry eyes).    [provider]  furosemide (LASIX) 20 MG tablet Take 1 tablet (20 mg total) by mouth as directed. Take 1 tablet in the morning. Can take additional dose if  needed for leg swelling and shortness of breath 12/23/20 03/23/21  Adrian Prows, MD  metFORMIN (GLUCOPHAGE) 500 MG tablet Take 500 mg by mouth 2 (two) times daily with a meal.    [provider]  potassium chloride SA (KLOR-CON) 20 MEQ tablet 17mq three times daily for 2 days then 420m po twice daily for 1 week and f/u with PCP for further management of hypokalemia Patient taking differently: Take 20 mEq by mouth 2 (two) times daily. management of hypokalemia 06/24/19   KaBrunetta GeneraMD      Allergies    Penicillins    Review of Systems   Review of Systems  Psychiatric/Behavioral:  Negative for agitation, behavioral problems, hallucinations and suicidal ideas.   All other systems reviewed and are negative.   Physical Exam Updated Vital Signs BP 132/86 (BP Location: Right Arm)   Pulse 74   Temp 98.1 F (36.7 C) (Oral)   Resp 18   Ht '5\' 4"'$  (1.626 m)   Wt 122.5 kg   SpO2 94%   BMI 46.35 kg/m  Physical Exam Vitals and nursing note reviewed.  Constitutional:      General: She is not in acute distress.    Appearance: She is not diaphoretic.  HENT:     Head: Normocephalic and atraumatic.     Mouth/Throat:     Pharynx: No oropharyngeal exudate.  Eyes:     General: No scleral icterus.    Conjunctiva/sclera: Conjunctivae normal.  Cardiovascular:     Rate  and Rhythm: Normal rate and regular rhythm.     Pulses: Normal pulses.     Heart sounds: Normal heart sounds.  Pulmonary:     Effort: Pulmonary effort is normal. No respiratory distress.     Breath sounds: Normal breath sounds. No wheezing.  Abdominal:     General: Bowel sounds are normal.     Palpations: Abdomen is soft. There is no mass.     Tenderness: There is no abdominal tenderness. There is no guarding or rebound.  Musculoskeletal:        General: Normal range of motion.     Cervical back: Normal range of motion and neck supple.  Skin:    General: Skin is warm and dry.  Neurological:     Mental Status:  She is alert.  Psychiatric:        Behavior: Behavior normal.     ED Results / Procedures / Treatments   Labs (all labs ordered are listed, but only abnormal results are displayed) Labs Reviewed  CBG MONITORING, ED - Abnormal; Notable for the following components:      Result Value   Glucose-Capillary 119 (*)    All other components within normal limits    EKG None  Radiology No results found.  Procedures Procedures    Medications Ordered in ED Medications - No data to display  ED Course/ Medical Decision Making/ A&P Clinical Course as of 02/27/22 0000  Sun Feb 26, 2022  2208 Discussed with patient and family member in detail regarding voluntary versus IVC process.  Answered all available questions. [SB]  2209 Attending in to speak with the patient and family member at bedside. [SB]  2236 Patient brought in by her daughter for evaluation of manic behavior.  Daughter has seen this behavior for her entire lifetime.  She has been off her medications for years.  She went to behavioral health urgent care today but did not feel she was getting the help she needed.  Patient does not want to stay for psychiatric evaluation.  She is not suicidal or homicidal and appears in no distress.  Daughter given resource options and will restart on her prior psychiatric medications.  Daughter also understands process for instituting an IVC if this becomes necessary.  Return instructions discussed [MB]    Clinical Course User Index [MB] Hayden Rasmussen, MD [SB] Nehemiah Settle, PA-C                           Medical Decision Making Risk Prescription drug management.   Pt presents with concerns for mental health problem onset today.  Daughter notes the patient has not been sleeping, having impulsive behavior, acting irrationally.  Patient denies SI/HI, auditory/visual hallucinations. Vital signs, patient afebrile. On exam, pt with no acute cardiovascular, respiratory, abdominal exam  findings.    Co morbidities that complicate the patient evaluation: Bipolar disorder  Additional history obtained:  Additional history obtained from Daughter/Son External records reviewed and noted:  Patient was evaluated at behavioral health urgent care center today and was discharged due to there being no emergent need found at the time.  When it came time for discharge, nurse practitioner attempted to provide patient with resources however patient's daughter abruptly left the room with the patient.  Disposition: Patient presenting for mental health concern.  Patient without SI/HI, visual/auditory hallucinations at this time, doubt need for IVC paperwork at this time.  Discussed with patient in length regarding that  she can take out IVC paperwork on the patient if she deems so.  Also discussed with patient that she can do a voluntary commitment for TTS consult.  After speaking with the attending, patient and daughter came to the conclusion that they would like to be discharged with resources and have the patient restarted back on her psychiatric medications. After consideration of the diagnostic results and the patients response to treatment, I feel that the patient would benefit from Discharge home.  Prescription sent for Abilify and Depakote, instructed the patient and patient daughter to have her follow-up with her primary care provider as needed.  Supportive care measures and strict return precautions discussed with patient at bedside. Pt acknowledges and verbalizes understanding. Pt appears safe for discharge. Follow up as indicated in discharge paperwork.    This chart was dictated using voice recognition software, Dragon. Despite the best efforts of this provider to proofread and correct errors, errors may still occur which can change documentation meaning.   Final Clinical Impression(s) / ED Diagnoses Final diagnoses:  Mental health problem    Rx / DC Orders ED Discharge Orders           Ordered    ARIPiprazole (ABILIFY) 15 MG tablet  Daily at bedtime        02/26/22 2254    divalproex (DEPAKOTE) 500 MG DR tablet  Nightly        02/26/22 2254              Brode Sculley A, PA-C 02/27/22 0000    Hayden Rasmussen, MD 02/27/22 1144

## 2022-02-26 NOTE — Progress Notes (Signed)
Pt and daughter left the facility after she was triaged and was seen by the provider. Pt did not wait for her AVS. Pt is discharged at this time.

## 2022-02-26 NOTE — BH Assessment (Signed)
Pt is a 61 year old female that was brought in to [facility] voluntary by her daughter who reports mother has been "acting strange" for the last week. Daughter reports patent has been putting bizarre objects in her purse along with other behaviors that daughter felt were "strange for her" although was vague in reference to context. Provider spoke with patient who was alert, oriented and denied any S/I, H/I or AVH. As provider was speaking to daughter and patient informing them that based on presenting criteria patient did not meet inpatient criteria daughter became upset sating "what am I going to do with her" as provider attempted to explain that we would assist in connecting patient with the appropriate OP resources daughter became upset and stated, "OK we are leaving" as mother made derogatory comments to daughter. Provider and this writer attempted to provide resources with daughter declined and left without incident before vitals could be obtained.

## 2022-03-19 ENCOUNTER — Emergency Department (HOSPITAL_COMMUNITY)
Admission: EM | Admit: 2022-03-19 | Discharge: 2022-03-21 | Disposition: A | Discharge status: Home / Self Care | Payer: Medicare HMO | Attending: Emergency Medicine | Admitting: Emergency Medicine

## 2022-03-19 ENCOUNTER — Encounter (HOSPITAL_COMMUNITY): Payer: Self-pay

## 2022-03-19 ENCOUNTER — Other Ambulatory Visit: Payer: Self-pay

## 2022-03-19 DIAGNOSIS — F319 Bipolar disorder, unspecified: Secondary | ICD-10-CM | POA: Diagnosis present

## 2022-03-19 DIAGNOSIS — F3189 Other bipolar disorder: Secondary | ICD-10-CM | POA: Diagnosis not present

## 2022-03-19 DIAGNOSIS — I110 Hypertensive heart disease with heart failure: Secondary | ICD-10-CM | POA: Insufficient documentation

## 2022-03-19 DIAGNOSIS — Z7984 Long term (current) use of oral hypoglycemic drugs: Secondary | ICD-10-CM | POA: Insufficient documentation

## 2022-03-19 DIAGNOSIS — F333 Major depressive disorder, recurrent, severe with psychotic symptoms: Secondary | ICD-10-CM | POA: Diagnosis present

## 2022-03-19 DIAGNOSIS — R440 Auditory hallucinations: Secondary | ICD-10-CM | POA: Diagnosis present

## 2022-03-19 DIAGNOSIS — I509 Heart failure, unspecified: Secondary | ICD-10-CM | POA: Diagnosis not present

## 2022-03-19 DIAGNOSIS — F332 Major depressive disorder, recurrent severe without psychotic features: Secondary | ICD-10-CM | POA: Diagnosis not present

## 2022-03-19 DIAGNOSIS — Z20822 Contact with and (suspected) exposure to covid-19: Secondary | ICD-10-CM | POA: Diagnosis not present

## 2022-03-19 DIAGNOSIS — E119 Type 2 diabetes mellitus without complications: Secondary | ICD-10-CM | POA: Insufficient documentation

## 2022-03-19 LAB — CBC WITH DIFFERENTIAL/PLATELET
Abs Immature Granulocytes: 0.02 10*3/uL (ref 0.00–0.07)
Basophils Absolute: 0 10*3/uL (ref 0.0–0.1)
Basophils Relative: 0 %
Eosinophils Absolute: 0.1 10*3/uL (ref 0.0–0.5)
Eosinophils Relative: 1 %
HCT: 36.9 % (ref 36.0–46.0)
Hemoglobin: 12 g/dL (ref 12.0–15.0)
Immature Granulocytes: 0 %
Lymphocytes Relative: 10 %
Lymphs Abs: 0.6 10*3/uL — ABNORMAL LOW (ref 0.7–4.0)
MCH: 32.3 pg (ref 26.0–34.0)
MCHC: 32.5 g/dL (ref 30.0–36.0)
MCV: 99.5 fL (ref 80.0–100.0)
Monocytes Absolute: 0.6 10*3/uL (ref 0.1–1.0)
Monocytes Relative: 9 %
Neutro Abs: 4.9 10*3/uL (ref 1.7–7.7)
Neutrophils Relative %: 80 %
Platelets: 187 10*3/uL (ref 150–400)
RBC: 3.71 MIL/uL — ABNORMAL LOW (ref 3.87–5.11)
RDW: 12.8 % (ref 11.5–15.5)
WBC: 6.2 10*3/uL (ref 4.0–10.5)
nRBC: 0 % (ref 0.0–0.2)

## 2022-03-19 LAB — COMPREHENSIVE METABOLIC PANEL
ALT: 15 U/L (ref 0–44)
AST: 19 U/L (ref 15–41)
Albumin: 3.4 g/dL — ABNORMAL LOW (ref 3.5–5.0)
Alkaline Phosphatase: 48 U/L (ref 38–126)
Anion gap: 8 (ref 5–15)
BUN: 15 mg/dL (ref 8–23)
CO2: 28 mmol/L (ref 22–32)
Calcium: 9.1 mg/dL (ref 8.9–10.3)
Chloride: 103 mmol/L (ref 98–111)
Creatinine, Ser: 0.88 mg/dL (ref 0.44–1.00)
GFR, Estimated: >60 mL/min (ref >60)
Glucose, Bld: 109 mg/dL — ABNORMAL HIGH (ref 70–99)
Potassium: 3.8 mmol/L (ref 3.5–5.1)
Sodium: 139 mmol/L (ref 135–145)
Total Bilirubin: 0.6 mg/dL (ref 0.3–1.2)
Total Protein: 7.3 g/dL (ref 6.5–8.1)

## 2022-03-19 LAB — SALICYLATE LEVEL: Salicylate Lvl: <7 mg/dL — ABNORMAL LOW (ref 7.0–30.0)

## 2022-03-19 LAB — RAPID URINE DRUG SCREEN, HOSP PERFORMED
Amphetamines: NOT DETECTED
Barbiturates: NOT DETECTED
Benzodiazepines: NOT DETECTED
Cocaine: NOT DETECTED
Opiates: NOT DETECTED
Tetrahydrocannabinol: NOT DETECTED

## 2022-03-19 LAB — URINALYSIS, ROUTINE W REFLEX MICROSCOPIC
Bilirubin Urine: NEGATIVE
Glucose, UA: NEGATIVE mg/dL
Hgb urine dipstick: NEGATIVE
Ketones, ur: NEGATIVE mg/dL
Leukocytes,Ua: NEGATIVE
Nitrite: NEGATIVE
Protein, ur: NEGATIVE mg/dL
Specific Gravity, Urine: 1.011 (ref 1.005–1.030)
pH: 7 (ref 5.0–8.0)

## 2022-03-19 LAB — RESP PANEL BY RT-PCR (FLU A&B, COVID) ARPGX2
Influenza A by PCR: NEGATIVE
Influenza B by PCR: NEGATIVE
SARS Coronavirus 2 by RT PCR: NEGATIVE

## 2022-03-19 LAB — ACETAMINOPHEN LEVEL: Acetaminophen (Tylenol), Serum: <10 ug/mL — ABNORMAL LOW (ref 10–30)

## 2022-03-19 LAB — ETHANOL: Alcohol, Ethyl (B): <10 mg/dL (ref <10)

## 2022-03-19 MED ORDER — DIVALPROEX SODIUM 500 MG PO DR TAB
500.0000 mg | DELAYED_RELEASE_TABLET | Freq: Every day | ORAL | Status: DC
  Administered 2022-03-19: 500 mg via ORAL
  Filled 2022-03-19: qty 1

## 2022-03-19 MED ORDER — ACETAMINOPHEN 325 MG PO TABS
650.0000 mg | ORAL_TABLET | ORAL | Status: DC | PRN
  Administered 2022-03-21: 650 mg via ORAL
  Filled 2022-03-19: qty 2

## 2022-03-19 MED ORDER — ARIPIPRAZOLE 5 MG PO TABS
15.0000 mg | ORAL_TABLET | Freq: Every day | ORAL | Status: DC
  Administered 2022-03-19 – 2022-03-20 (×2): 15 mg via ORAL
  Filled 2022-03-19 (×2): qty 1

## 2022-03-19 MED ORDER — FUROSEMIDE 40 MG PO TABS
20.0000 mg | ORAL_TABLET | Freq: Every day | ORAL | Status: DC
  Administered 2022-03-20: 20 mg via ORAL
  Filled 2022-03-19: qty 1

## 2022-03-19 MED ORDER — ONDANSETRON HCL 4 MG PO TABS: 4.0000 mg | ORAL_TABLET | Freq: Three times a day (TID) | ORAL | Status: DC | PRN

## 2022-03-19 MED ORDER — FUROSEMIDE 40 MG PO TABS
20.0000 mg | ORAL_TABLET | Freq: Every day | ORAL | Status: DC | PRN
Start: 2022-03-19 — End: 2022-03-21

## 2022-03-19 MED ORDER — FUROSEMIDE 40 MG PO TABS: 20.0000 mg | ORAL_TABLET | ORAL | Status: DC

## 2022-03-19 MED ORDER — ALUM & MAG HYDROXIDE-SIMETH 200-200-20 MG/5ML PO SUSP: 30.0000 mL | Freq: Four times a day (QID) | ORAL | Status: DC | PRN

## 2022-03-19 NOTE — ED Triage Notes (Signed)
Odd behavior at home. Patient has not been able to take care of herself. Patient is also hearing voices. Patient's daughter states worse when she is alone. Patient has not been taking her meds x 2 weeks.

## 2022-03-19 NOTE — BH Assessment (Signed)
Comprehensive Clinical Assessment (CCA) Note  03/20/2022 Jodi [name] [id number]  Discharge Disposition: Jodi Reichert, NP, reviewed pt's chart and information and determined pt meets inpatient criteria. Pt's referral information will be faxed out to multiple hospitals, including [name], for potential placement. This information was relayed to pt's team at 2339.  The patient demonstrates the following risk factors for suicide: Chronic risk factors for suicide include: psychiatric disorder of Bipolar Disorder, Severe, with Psychosis . Acute risk factors for suicide include: family or marital conflict, social withdrawal/isolation, and loss (financial, interpersonal, professional). Protective factors for this patient include: positive social support, coping skills, and hope for the future. Considering these factors, the overall suicide risk at this point appears to be none. Patient is not appropriate for outpatient follow up.  Therefore, no sitter is recommended for suicide precautions.  Flowsheet Row ED from 03/19/2022 in [place] DEPT ED from 02/26/2022 in [place] DEPT ED from 12/28/2021 in [facility] [place] No Risk No Risk No Risk     Chief Complaint:  Chief Complaint  Patient presents with   Schizophrenia   Medication Concerns   Visit Diagnosis: Bipolar Disorder, Severe, with Psychosis  CCA Screening, Triage and Referral (STR) Jodi [name] is a 61 year old patient who was brought to the [facility] via her daughter due to ongoing Jodi [name] that are commanding her to moving things in her home as well as paranoia. Pt states, "I feel like I'm differently acting. In my house I take things and put them where my mind tells me to." Pt states this has been occurring for several weeks to a month and that it's getting worse. She shares she went back on her medication on June 11 (2023) but that she has not  been taking it consistently.   Pt's daughter shares pt has been experiencing AH in the form of commands telling her not to sleep, not to eat, and to put things in odd places. Pt's daughter shares pt has also been experiencing paranoia, including baracading herself in her home, staying awake all night due to thoughts people were coming into her home, and calling her daughter and sister all night due to these concerns. Pt's daughter states that today her mother was repeating things in 3s, including, "I can't stop. I can't stop. I can't stop. I need help. I need help. I need help," and so on.   Pt denies SI or a hx of SI. She denies she's ever attempted to kill herself or that she has a plan to kill herself. Pt has been hospitalized in the past with the last hospitalization taking place in 2014. Pt denies HI, VH, NSSIB, access to guns/weapons, engagement with the legal system, or SA.  Pt was sleepy throughout the assessment due to being given her night-time medication; due to this, her daughter answered the majority of the questions posed, with the exception of the information listed in the above paragraph. Pt gave verbal consent for her daughter to stay in the room during the assessment and to provide information for the assessment.  Pt is oriented x5. Her recent/remote memory is intact. Pt was cooperative, though sleepy, throughout the assessment process. Pt's insight, judgment, and impulse control is impaired at this time.  Patient Reported Information How did you hear about [place]? Family/Friend  What Is the Reason for Your Visit/Call Today? Pt states, "I feel like I'm differently acting. In my house I take things and put them  where my mind tells me to." Pt states this has been occurring for several weeks to a month and that it's getting worse. She shares she went back on her medication on June 11 (2023) but that she has not been taking it consistently. Pt's daughter shares pt has been experiencing AH in  the form of commands tellin gher not to sleep, not to eat, and to put things in odd places. Pt's daughter shares pt has also been experiencing paranoia, including baracading herself in her home, staying awake all night due to thoughts people were coming into her home, and calling her daughter and sister all night due to these concerns. Pt's daughter states that today her mother was repeating things in 3s, including, "I can't stop. I can't stop. I can't stop. I need help. I need help. I need help," and so on. Pt denies SI or a hx of SI. She denies she's ever attempted to kill herself or that she has a plan to kill herself. Pt has been hospitalized in the past with the last hospitalization taking place in 2014. Pt denies HI, VH, NSSIB, access to guns/weapons, engagement with the legal system, or SA.  How Long Has This Been Causing You Problems? 1 wk - 1 month  What Do You Feel Would Help You the Most Today? Treatment for Depression or other mood problem; Medication(s)   Have You Recently Had Any Thoughts About Hurting Yourself? No  Are You Planning to Commit Suicide/Harm Yourself At This time? No   Have you Recently Had Thoughts About [place]? No  Are You Planning to Harm Someone at This Time? No  Explanation: No data recorded  Have You Used Any Alcohol or Drugs in the Past 24 Hours? No  How Long Ago Did You Use Drugs or Alcohol? No data recorded What Did You Use and How Much? No data recorded  Do You Currently Have a Therapist/Psychiatrist? Yes  Name of Therapist/Psychiatrist: Pt currently has a medication provider, though her daughter cannot remember the prescriber's name. They are attempting to find a therapist for pt.   Have You Been Recently Discharged From Any Office Practice or Programs? No  Explanation of Discharge From Practice/Program: No data recorded    CCA Screening Triage Referral Assessment Type of Contact: Tele-Assessment  Telemedicine Service Delivery:  Telemedicine service delivery: This service was provided via telemedicine using a 2-way, interactive audio and video technology  Is this Initial or Reassessment? Initial Assessment  Date Telepsych consult ordered in [facility]:  03/20/22  Time Telepsych consult ordered in [facility]:  2055  Location of Assessment: [facility] ED  Provider Location: [name] Assessment Services   Collateral Involvement: Breella Vanostrand, daughter: [phone]; pt gave verbal consent for her daughter to stay in the room throughout the assessment and to provide information.   Does Patient Have a Stage manager Guardian? No data recorded Name and Contact of Legal Guardian: No data recorded If Minor and Not Living with Parent(s), Who has Custody? N/A  Is CPS involved or ever been involved? Never  Is APS involved or ever been involved? Never   Patient Determined To Be At Risk for Harm To Self or Others Based on Review of Patient Reported Information or Presenting Complaint? No  Method: No data recorded Availability of Means: No data recorded Intent: No data recorded Notification Required: No data recorded Additional Information for Danger to Others Potential: No data recorded Additional Comments for Danger to Others Potential: No data recorded Are There  Guns or Other Weapons in [place]? No data recorded Types of Guns/Weapons: No data recorded Are These Weapons Safely Secured?                            No data recorded Who Could Verify You Are Able To Have These Secured: No data recorded Do You Have any Outstanding Charges, Pending Court Dates, Parole/Probation? No data recorded Contacted To Inform of Risk of Harm To Self or Others: -- (N/A)    Does Patient Present under Involuntary Commitment? No  IVC Papers Initial File Date: No data recorded  [place] of Residence: [place]   Patient Currently Receiving the Following Services: Medication Management   Determination of Need: Emergent (2 hours)   Options  For Referral: Outpatient Therapy; Inpatient Hospitalization; Medication Management     CCA Biopsychosocial Patient Reported Schizophrenia/Schizoaffective Diagnosis in Past: No   Strengths: Pt is able to identify that she is in need of assistance for her mental health symptoms.   Mental Health Symptoms Depression:   None   Duration of Depressive symptoms:    Mania:   None   Anxiety:    Worrying; Tension; Sleep   Psychosis:   Delusions; Hallucinations   Duration of Psychotic symptoms:  Duration of Psychotic Symptoms: Less than six months   Trauma:   None   Obsessions:   None   Compulsions:   None; Disrupts with routine/functioning; Intended to reduce stress or prevent another outcome   Inattention:   None   Hyperactivity/Impulsivity:   None   Oppositional/Defiant Behaviors:   None   Emotional Irregularity:   Transient, stress-related paranoia/disassociation   Other Mood/Personality Symptoms:   None noted    Mental Status Exam Appearance and self-care  Stature:   Average   Weight:   Average weight   Clothing:   -- [facility] gown)   Grooming:   Neglected   Cosmetic use:   None   Posture/gait:   Normal   Motor activity:   Not Remarkable   Sensorium  Attention:   Normal   Concentration:   Normal   Orientation:   X5   Recall/memory:   Normal   Affect and Mood  Affect:   Appropriate   Mood:   Other (Comment) (Friendly)   Relating  Eye contact:   Normal   Facial expression:   Responsive   Attitude toward examiner:   Cooperative   Thought and Language  Speech flow:  Clear and Coherent   Thought content:   Appropriate to Mood and Circumstances   Preoccupation:   Other (Comment) (Hallucinations)   Hallucinations:   Auditory   Organization:  No data recorded  [employer] of Knowledge:   Average   Intelligence:   Average   Abstraction:   Functional   Judgement:   Impaired   Reality  Testing:   Adequate   Insight:   Fair   Decision Making:   Only simple   Social Functioning  Social Maturity:   Responsible (Pt is responsible, but when she forgets to or believes she shouldn't take her medicaiton she becomes unable to care for herself.)   Social Judgement:   Normal   Stress  Stressors:   Family conflict; Grief/losses; Housing; Transitions; Other (Comment) (Family changes/family conflict)   Coping Ability:   Exhausted; Overwhelmed   Skill Deficits:   Activities of daily living; Self-care; Self-control; Communication (* when pt is not on her medication)  Supports:   Family     Religion: Religion/Spirituality Are You A Religious Person?:  (Not assessed) How Might This Affect Treatment?: Not assessed  Leisure/Recreation: Leisure / Recreation Do You Have Hobbies?:  (Not assessed)  Exercise/Diet: Exercise/Diet Do You Exercise?:  (Not assessed) Have You Gained or Lost A Significant Amount of Weight in the Past Six Months?:  (Not assessed) Do You Follow a Special Diet?:  (Not assessed) Do You Have Any Trouble Sleeping?: Yes Explanation of Sleeping Difficulties: Pt has been trying to stay awake due to concerns that people are coming in her home. She also sometimes skips her night-time medication because it makes her sleepy, thus adding to her mental health symptoms.   CCA Employment/Education Employment/Work Situation: Employment / Work Technical sales engineer: On disability Why is Patient on Disability: Mental health dx How Long has Patient Been on Disability: Over 30 years Patient's Job has Been Impacted by Current Illness:  (N/A) Has Patient ever Been in the [employer]?:  (Not assessed)  Education: Education Is Patient Currently Attending School?: No Last Grade Completed: 13 ((some college)) Did Physicist, medical?: Yes What Type of College Degree Do you Have?: Pt attended some college but has no degree Did You Have An Individualized  Education Program (IIEP): No Did You Have Any Difficulty At School?: No Patient's Education Has Been Impacted by Current Illness: No   CCA Family/Childhood History Family and Relationship History: Family history Marital status: Single Does patient have children?: Yes How many children?: 3 How is patient's relationship with their children?: Pt has a good relationship with all 3 of her children.  Childhood History:  Childhood History By whom was/is the patient raised?:  (Not assessed) Did patient suffer any verbal/emotional/physical/sexual abuse as a child?:  (Not assessed) Did patient suffer from severe childhood neglect?:  (Not assessed) Has patient ever been sexually abused/assaulted/raped as an adolescent or adult?:  (Not assessed) Was the patient ever a victim of a crime or a disaster?:  (Not assessed) Witnessed domestic violence?:  (Not assessed) Has patient been affected by domestic violence as an adult?:  (Not assessed)  Child/Adolescent Assessment:     CCA Substance Use Alcohol/Drug Use: Alcohol / Drug Use Pain Medications: See MAR Prescriptions: See MAR Over the Counter: See MAR History of alcohol / drug use?: No history of alcohol / drug abuse Longest period of sobriety (when/how long): N/A Negative Consequences of Use:  (N/A) Withdrawal Symptoms:  (N/A)                         ASAM's:  Six Dimensions of Multidimensional Assessment  Dimension 1:  Acute Intoxication and/or Withdrawal Potential:      Dimension 2:  Biomedical Conditions and Complications:      Dimension 3:  Emotional, Behavioral, or Cognitive Conditions and Complications:     Dimension 4:  Readiness to Change:     Dimension 5:  Relapse, Continued use, or Continued Problem Potential:     Dimension 6:  Recovery/Living Environment:     ASAM Severity Score:    ASAM Recommended Level of Treatment: ASAM Recommended Level of Treatment:  (N/A)   Substance use Disorder (SUD) Substance Use  Disorder (SUD)  Checklist Symptoms of Substance Use:  (N/A)  Recommendations for Services/Supports/Treatments: Recommendations for Services/Supports/Treatments Recommendations For Services/Supports/Treatments: Individual Therapy, Medication Management, Inpatient Hospitalization  Discharge Disposition: Jodi Reichert, NP, reviewed pt's chart and information and determined pt meets inpatient criteria. Pt's referral information will be faxed out  to multiple hospitals, including [name], for potential placement. This information was relayed to pt's team at 2339.  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   Bipolar disorder, in partial remission, most recent episode manic ([place])    Pulmonary hypertension ([place])    Acute respiratory failure with hypoxia and hypercarbia (HCC)    Morbid obesity due to excess calories (HCC)    Nocturnal hypoxemia due to obesity    Acute on chronic diastolic (congestive) heart failure ([place]) 12/03/2015   CHF (congestive heart failure) ([place]) 11/18/2015   Non compliance with medical treatment 09/16/2015   Bipolar disorder ([place]) 09/15/2015   Type 2 diabetes mellitus ([place]) 09/15/2015   Morbid obesity ([place]) 09/15/2015     Referrals to Alternative Service(s): Referred to Alternative Service(s):   Place:   Date:   Time:    Referred to Alternative Service(s):   Place:   Date:   Time:    Referred to Alternative Service(s):   Place:   Date:   Time:    Referred to Alternative Service(s):   Place:   Date:   Time:     Dannielle Burn, LMFT

## 2022-03-19 NOTE — ED Provider Notes (Addendum)
[facility] DEPT Provider Note   CSN: [id number] Arrival date & time: 03/19/22  1452     History  No chief complaint on file.   Jodi [name] is a 61 y.o. female with a history of bipolar disorder, diabetes mellitus, pulmonary hypertension, pickwickian syndrome, hyperlipidemia, CHF.  Presents to the emergency department with a chief complaint of auditory hallucinations and behavioral change.  Patient reports that she has been off of her medications for the last year however restarted this medication 2 weeks prior.  Patient has been compliant with her medications of the last 2 weeks.  Patient states that despite taking her medications she has had progressively worsening of her behavior and auditory hallucinations.  Patient reports that she is hearing a voice in her head that is telling her to do things and dictating her decisions.  Patient's daughter reports that patient has been very indecisive been there is only making decisions based on "what the voices tell her."  Patient's daughter reports that due to this she has not been eating or drinking as much as normal.  Patient reports that she is also having difficulty taking care of herself at home stating "I am doing strange things around the house."  Patient daughter reports that the patient called her this morning and said "I cannot stop it I am doing, I do not feel good."  Patient daughter reports that patient has previously been admitted for psychiatric care.  Most recently this occurred in 2014.  Patient denies any SI, HI, or visual [place].  HPI     Home Medications Prior to Admission medications   Medication Sig Start Date End Date Taking? Authorizing Provider  acetaminophen (TYLENOL) 500 MG tablet Take 500 mg by mouth every 6 (six) hours as needed for mild pain or headache.   Yes [provider]  ARIPiprazole (ABILIFY) 15 MG tablet Take 1 tablet (15 mg total) by mouth at bedtime.  02/26/22  Yes Blue, Soijett A, PA-C  divalproex (DEPAKOTE) 500 MG DR tablet Take 1 tablet (500 mg total) by mouth at bedtime. 02/26/22  Yes Blue, Soijett A, PA-C  furosemide (LASIX) 20 MG tablet Take 1 tablet (20 mg total) by mouth as directed. Take 1 tablet in the morning. Can take additional dose if needed for leg swelling and shortness of breath Patient taking differently: Take 20 mg by mouth See admin instructions. Take 20 mg by mouth in the morning and an additional 20 mg as needed for leg swelling and shortness of breath 12/23/20 03/19/22 Yes Adrian Prows, MD  ibuprofen (ADVIL) 200 MG tablet Take 200-400 mg by mouth every 6 (six) hours as needed for mild pain or headache.   Yes [provider]  metFORMIN (GLUCOPHAGE) 500 MG tablet Take 500 mg by mouth daily with breakfast.   Yes [provider]  NON FORMULARY Place 1-2 drops into both eyes See admin instructions. [facility] redness relief eye drops- Place 1-2 drops into both eyes three to four times a day as needed for redness   Yes [provider]  potassium chloride SA (KLOR-CON) 20 MEQ tablet 40mq three times daily for 2 days then 474m po twice daily for 1 week and f/u with PCP for further management of hypokalemia Patient taking differently: Take 20 mEq by mouth at bedtime. 06/24/19  Yes KaBrunetta GeneraMD      Allergies    Penicillins    Review of Systems   Review of Systems  Constitutional:  Negative for chills and fever.  Eyes:  Negative for visual disturbance.  Respiratory:  Negative for shortness of breath.   Cardiovascular:  Negative for chest pain.  Gastrointestinal:  Negative for abdominal pain, nausea and vomiting.  Genitourinary:  Negative for difficulty urinating and dysuria.  Musculoskeletal:  Negative for back pain and neck pain.  Skin:  Negative for color change and rash.  Neurological:  Negative for dizziness, syncope, light-headedness and headaches.  Psychiatric/Behavioral:  Positive for  hallucinations and sleep disturbance. Negative for confusion, self-injury and suicidal ideas. The patient is nervous/anxious.     Physical Exam Updated Vital Signs BP (!) 136/98 (BP Location: Right Arm)   Pulse (!) 55   Temp 98 F (36.7 C) (Oral)   Resp 16   Ht '5\' 4"'$  (1.626 m)   Wt 122.5 kg   SpO2 99%   BMI 46.35 kg/m  Physical Exam Vitals and nursing note reviewed.  Constitutional:      General: She is not in acute distress.    Appearance: She is not ill-appearing, toxic-appearing or diaphoretic.     Comments: Patient appears disheveled with messy hair and dirty close.  HENT:     Head: Normocephalic.  Eyes:     General: No scleral icterus.       Right eye: No discharge.        Left eye: No discharge.  Cardiovascular:     Rate and Rhythm: Normal rate.  Pulmonary:     Effort: Pulmonary effort is normal. No tachypnea, bradypnea or respiratory distress.     Breath sounds: Normal breath sounds. No stridor.  Skin:    General: Skin is warm and dry.  Neurological:     General: No focal deficit present.     Mental Status: She is alert and oriented to person, place, and time.     GCS: GCS eye subscore is 4. GCS verbal subscore is 5. GCS motor subscore is 6.  Psychiatric:        Attention and Perception: She is attentive. She perceives auditory hallucinations. She does not perceive visual hallucinations.        Mood and Affect: Mood is anxious.        Behavior: Behavior is withdrawn. Behavior is cooperative.        Thought Content: Thought content is not paranoid or delusional. Thought content does not include homicidal or suicidal ideation. Thought content does not include homicidal or suicidal plan.     ED Results / Procedures / Treatments   Labs (all labs ordered are listed, but only abnormal results are displayed) Labs Reviewed  COMPREHENSIVE METABOLIC PANEL - Abnormal; Notable for the following components:      Result Value   Glucose, Bld 109 (*)    Albumin 3.4 (*)     All other components within normal limits  CBC WITH DIFFERENTIAL/PLATELET - Abnormal; Notable for the following components:   RBC 3.71 (*)    Lymphs Abs 0.6 (*)    All other components within normal limits  SALICYLATE LEVEL - Abnormal; Notable for the following components:   Salicylate Lvl <8.2 (*)    All other components within normal limits  ACETAMINOPHEN LEVEL - Abnormal; Notable for the following components:   Acetaminophen (Tylenol), Serum <10 (*)    All other components within normal limits  RESP PANEL BY RT-PCR (FLU A&B, COVID) ARPGX2  ETHANOL  RAPID URINE DRUG SCREEN, HOSP PERFORMED  URINALYSIS, ROUTINE W REFLEX MICROSCOPIC    EKG EKG Interpretation  Date/Time:  Sunday March 19 2022 16:40:45 EDT Ventricular Rate:  57 PR Interval:  189 QRS Duration: 77 QT Interval:  544 QTC Calculation: 530 R Axis:   46 Text Interpretation: Sinus rhythm Low voltage, precordial leads Borderline T abnormalities, diffuse leads Prolonged QT interval Since last tracing PR is shorter and QT is longer Otherwise no significant change Confirmed by Daleen Bo [phone]) on 03/19/2022 8:13:20 PM  Radiology No results found.  Procedures Procedures    Medications Ordered in ED Medications  ARIPiprazole (ABILIFY) tablet 15 mg (15 mg Oral Given 03/19/22 2202)  divalproex (DEPAKOTE) DR tablet 500 mg (500 mg Oral Given 03/19/22 2202)  furosemide (LASIX) tablet 20 mg (has no administration in time range)  furosemide (LASIX) tablet 20 mg (has no administration in time range)    ED Course/ Medical Decision Making/ A&P                           Medical Decision Making Amount and/or Complexity of Data Reviewed Labs: ordered.  Risk OTC drugs. Prescription drug management.   Alert 61 year old female no acute distress, nontoxic-appearing.  Presents to the emergency department with a chief complaint of altered hallucinations and behavioral disturbance.  Information obtained from patient and patient's  daughter at bedside.  I reviewed patient's past medical records including previous notes, labs, and imaging.  Patient has medical history as outlined in HPI which complicates her care.  For clearance labs were ordered while patient was in triage.  I personally viewed interpret patient's lab results.  Pertinent findings include: -UA, CMP, CBC, Tylenol level, salicylate level, and UDS unremarkable  I personally viewed and interpreted patient's EKG.  Tracing shows sinus rhythm.  Patient is medically cleared.  TTS consult placed.  Patient's home medications were reordered.  Patient's daughter request to be consulted by TTS for collateral information.  Patient agrees to this.  Disposition pending TTS consult.   Quintella Reichert, NP, reviewed pt's chart and information and determined pt meets inpatient criteria. Pt's referral information will be faxed out to multiple hospitals, including [facility], for potential placement     Final Clinical Impression(s) / ED Diagnoses Final diagnoses:  None    Rx / DC Orders ED Discharge Orders     None         Loni Beckwith, PA-C 03/19/22 2324    Loni Beckwith, PA-C 03/19/22 2349    Hayden Rasmussen, MD 03/20/22 [phone]

## 2022-03-19 NOTE — ED Notes (Signed)
Asked pt if she could provide a urine sample, she said she could try

## 2022-03-19 NOTE — ED Provider Triage Note (Signed)
Emergency Medicine Provider Triage Evaluation Note  Jodi [name] , a 61 y.o. female  was evaluated in triage.  Pt complains of behavioral change and auditory hallucinations.  Patient reports that she is hearing a voice in her head that tells her to do things around her house.  Patient and daughter report that she is having behavior and delusions at home.  Patient has been on her antipsychotic medication since 02/26/2022 however symptoms have been getting progressively worse.  Review of Systems  Positive: Insomnia, auditory hallucinations, delusional behavior Negative: SI, HI, AVH  Physical Exam  BP (!) 165/112 (BP Location: Right Arm)   Pulse 77   Temp 98.2 F (36.8 C) (Oral)   Resp 18   SpO2 98%  Gen:   Awake, no distress , alert to person, place, and time; patient appears disheveled Resp:  Normal effort  MSK:   Moves extremities without difficulty  Other:    Medical Decision Making  Medically screening exam initiated at 4:21 PM.  Appropriate orders placed.  Jodi [name] was informed that the remainder of the evaluation will be completed by another provider, this initial triage assessment does not replace that evaluation, and the importance of remaining in the ED until their evaluation is complete.  Med clearance labs ordered.   Loni Beckwith, [place] 03/19/22 1622

## 2022-03-20 DIAGNOSIS — F209 Schizophrenia, unspecified: Secondary | ICD-10-CM | POA: Insufficient documentation

## 2022-03-20 DIAGNOSIS — F332 Major depressive disorder, recurrent severe without psychotic features: Secondary | ICD-10-CM | POA: Diagnosis not present

## 2022-03-20 DIAGNOSIS — F333 Major depressive disorder, recurrent, severe with psychotic symptoms: Secondary | ICD-10-CM | POA: Diagnosis present

## 2022-03-20 DIAGNOSIS — F3189 Other bipolar disorder: Secondary | ICD-10-CM | POA: Diagnosis present

## 2022-03-20 MED ORDER — SERTRALINE HCL 50 MG PO TABS
25.0000 mg | ORAL_TABLET | Freq: Every day | ORAL | Status: DC
  Administered 2022-03-20: 25 mg via ORAL
  Filled 2022-03-20: qty 1

## 2022-03-20 NOTE — Consult Note (Cosign Needed Addendum)
[facility] ED ASSESSMENT   Reason for Consult:  Psychiatry evaluation Referring Physician:  ER Physician Patient Identification: Jodi [name] MRN:  [id number] ED Chief Complaint: Severe bipolar affective disorder with psychosis (Jodi [name])  Diagnosis:  Principal Problem:   Severe bipolar affective disorder with psychosis (Jodi [name]) Active Problems:   Bipolar disorder (Jodi [name])   Major depressive disorder, recurrent, severe with psychotic features [facility])   ED Assessment Time Calculation: Start Time: 1704 Stop Time: 1730 Total Time in Minutes (Assessment Completion): 26   Subjective:   Jodi [name] is a 61 y.o. female patient admitted with hx significant for Depression and   Bipolar disorder brought in to the ER by her daughter for not being able to care for herself, not taking her medications and having command auditory hallucination.[name]  HPI:  Patient was seen this afternoon and she reported auditory hallucination with the voices telling her not to sleep or eat.  Patient also admitted that she has not been taking her medications consistently.  She is only on Depakote 500 mg at night and Abilify 15 mg at night also.  Patient reported that both medications makes her too tired and sleepy.  Patient reported that she is always tired  and sleeping and unable to do anything.   She agrees to go to inpatient for stabilization.  She described her appetite as so-so and stated that she is depressed since she came to the hospital.  Provider explained to her that her low energy, poor motivation and hypersomnia are all symptoms of Depression.,  Patient agrees to try low dose Sertraline and agreed to stop Depakote since that could be the reason why she is tired.  Staying on Abilify has advantage of giving her LAI if needed due to poor medication compliance.  She meets criteria for inpatient hospitalization and we will seek bed placement at any hospital with available bed.  Mean while we will add Sertraline 25 mg daily and  increase Abilify to 20 mg  and discontinue Depakote.  Past Psychiatric History: Long Hx Bipolar d/o, Depression multiple inpatient Psychiatric hospitalizations including but not limited to [facility] and [facility].  Last inpatient Psychiatry hospital was 2014 at [place] to Self or Others: Is the patient at risk to self? No Has the patient been a risk to self in the past 6 months? No Has the patient been a risk to self within the distant past? No Is the patient a risk to others? No Has the patient been a risk to others in the past 6 months? No Has the patient been a risk to others within the distant past? No  [place] Scale:  [place] ED from 03/19/2022 in [place] DEPT ED from 02/26/2022 in [place] [facility]PT ED from 12/28/2021 in [facility] [place] No Risk No Risk No Risk       AIMS:  , , ,  ,   ASAM: ASAM Multidimensional Assessment Summary ASAM Recommended Level of Treatment:  (N/A)  Substance Abuse:  Alcohol / Drug Use Pain Medications: See MAR Prescriptions: See MAR Over the Counter: See MAR History of alcohol / drug use?: No history of alcohol / drug abuse Longest period of sobriety (when/how long): N/A Negative Consequences of Use:  (N/A) Withdrawal Symptoms:  (N/A)  Past Medical History:  Past Medical History:  Diagnosis Date   Anginal pain ([place])    Atypical chest pain  Bipolar disorder ([place])    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   CHF (congestive heart failure) (HCC)    Chronic back pain    Chronic knee pain    Cor pulmonale (chronic) (HCC)    Depression    Diabetes mellitus    Type II   Dyspnea    with fluid retention; with activity   Headache    Hyperlipemia    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   Moderate to severe pulmonary hypertension (HCC)    Morbid obesity (HCC)     Obesity hypoventilation syndrome (HCC)    On home oxygen therapy    PRN   Pickwickian syndrome (HCC)    Thoracic aortic aneurysm [facility])     Past Surgical History:  Procedure Laterality Date   RIGHT OOPHORECTOMY     SINUS ENDO WITH FUSION N/A 02/13/2017   Procedure: ENDOSCOPIC SINUS ENDO WITH FUSION;  Surgeon: Rozetta Nunnery, MD;  Location: [facility] OR;  Service: ENT;  Laterality: N/A;   SINUSOTOMY Right 02/13/2017   Procedure: SINUSOTOMY VIA CALDWELL [place];  Surgeon: Rozetta Nunnery, MD;  Location: [facility] OR;  Service: ENT;  Laterality: Right;   TONSILLECTOMY     Family History:  Family History  Problem Relation Age of Onset   Cancer Father    Diabetes Other    Stroke Mother    Diabetes Mother    Family Psychiatric  History: unknown Social History:  Social History   Substance and Sexual Activity  Alcohol Use Yes   Alcohol/week: 0.0 standard drinks of alcohol   Comment: rarely     Social History   Substance and Sexual Activity  Drug Use Never    Social History   Socioeconomic History   Marital status: Single    Spouse name: Not on file   Number of children: 3   Years of education: Not on file   Highest education level: Not on file  Occupational History   Not on file  Tobacco Use   Smoking status: Former    Types: Cigarettes    Quit date: 09/19/2003    Years since quitting: 18.5   Smokeless tobacco: Never   Tobacco comments:    greater than 14 years ago  Vaping Use   Vaping Use: Never used  Substance and Sexual Activity   Alcohol use: Yes    Alcohol/week: 0.0 standard drinks of alcohol    Comment: rarely   Drug use: Never   Sexual activity: Not on file  Other Topics Concern   Not on file  Social History Narrative   Not on file   Social Determinants of Health   Financial Resource Strain: Not on file  Food Insecurity: Not on file  Transportation Needs: No Transportation Needs (01/14/2019)   PRAPARE - Transportation    Lack of Transportation (Medical):  No    Lack of Transportation (Non-Medical): No  Physical Activity: Not on file  Stress: Not on file  Social Connections: Not on file   Additional Social History:    Allergies:   Allergies  Allergen Reactions   Penicillins Other (See Comments)    Has patient had a PCN reaction causing immediate rash, facial/tongue/throat swelling, SOB or lightheadedness with hypotension: Unknown Has patient had a PCN reaction causing severe rash involving mucus membranes or skin necrosis: Unknown Has patient had a PCN reaction that required hospitalization: No Has patient had a PCN reaction occurring within the last 10 years: Unknown Patient states her  MD informed her that she was allergic. If all of the above answers are "NO", then may proceed with Cephalo    Labs:  Results for orders placed or performed during the hospital encounter of 03/19/22 (from the past 48 hour(s))  Ethanol     Status: None   Collection Time: 03/19/22  4:45 PM  Result Value Ref Range   Alcohol, Ethyl (B) <10 <10 mg/dL    Comment: (NOTE) Lowest detectable limit for serum alcohol is 10 mg/dL.  For medical purposes only. Performed at [facility] [place] [address] [place], [place] [address]   Salicylate level     Status: Abnormal   Collection Time: 03/19/22  4:45 PM  Result Value Ref Range   Salicylate Lvl <5.4 (L) 7.0 - 30.0 mg/dL    Comment: Performed at [facility], [place], [place] [address], [place], [place] [address]  Acetaminophen level     Status: Abnormal   Collection Time: 03/19/22  4:45 PM  Result Value Ref Range   Acetaminophen (Tylenol), Serum <10 (L) 10 - 30 ug/mL    Comment: (NOTE) Therapeutic concentrations vary significantly. A range of 10-30 ug/mL  may be an effective concentration for many patients. However, some  are best treated at concentrations outside of this range. Acetaminophen concentrations >150 ug/mL at 4 hours after ingestion  and >50 ug/mL at 12 hours after  ingestion are often associated with  toxic reactions.  Performed at [facility], [place] [address] [place], [place] [address]   Comprehensive metabolic panel     Status: Abnormal   Collection Time: 03/19/22  5:04 PM  Result Value Ref Range   Sodium 139 135 - 145 mmol/L   Potassium 3.8 3.5 - 5.1 mmol/L   Chloride 103 98 - 111 mmol/L   CO2 28 22 - 32 mmol/L   Glucose, Bld 109 (H) 70 - 99 mg/dL    Comment: Glucose reference range applies only to samples taken after fasting for at least 8 hours.   BUN 15 8 - 23 mg/dL   Creatinine, Ser 0.88 0.44 - 1.00 mg/dL   Calcium 9.1 8.9 - 10.3 mg/dL   Total Protein 7.3 6.5 - 8.1 g/dL   Albumin 3.4 (L) 3.5 - 5.0 g/dL   AST 19 15 - 41 U/L   ALT 15 0 - 44 U/L   Alkaline Phosphatase 48 38 - 126 U/L   Total Bilirubin 0.6 0.3 - 1.2 mg/dL   GFR, Estimated >60 >60 mL/min    Comment: (NOTE) Calculated using the CKD-EPI Creatinine Equation (2021)    Anion gap 8 5 - 15    Comment: Performed at [facility], [place] [address] [place], [place] [address]  CBC with Diff     Status: Abnormal   Collection Time: 03/19/22  5:04 PM  Result Value Ref Range   WBC 6.2 4.0 - 10.5 K/uL   RBC 3.71 (L) 3.87 - 5.11 MIL/uL   Hemoglobin 12.0 12.0 - 15.0 g/dL   HCT 36.9 36.0 - 46.0 %   MCV 99.5 80.0 - 100.0 fL   MCH 32.3 26.0 - 34.0 pg   MCHC 32.5 30.0 - 36.0 g/dL   RDW 12.8 11.5 - 15.5 %   Platelets 187 150 - 400 K/uL   nRBC 0.0 0.0 - 0.2 %   Neutrophils Relative % 80 %   Neutro Abs 4.9 1.7 - 7.7 K/uL   Lymphocytes Relative 10 %   Lymphs Abs 0.6 (L)  0.7 - 4.0 K/uL   Monocytes Relative 9 %   Monocytes Absolute 0.6 0.1 - 1.0 K/uL   Eosinophils Relative 1 %   Eosinophils Absolute 0.1 0.0 - 0.5 K/uL   Basophils Relative 0 %   Basophils Absolute 0.0 0.0 - 0.1 K/uL   Immature Granulocytes 0 %   Abs Immature Granulocytes 0.02 0.00 - 0.07 K/uL    Comment: Performed at [facility], [place] [address],  [place], [place] [address]  Resp Panel by RT-PCR (Flu A&B, Covid) Anterior Nasal Swab     Status: None   Collection Time: 03/19/22  7:20 PM   Specimen: Anterior Nasal Swab  Result Value Ref Range   SARS Coronavirus 2 by RT PCR NEGATIVE NEGATIVE    Comment: (NOTE) SARS-CoV-2 target nucleic acids are NOT DETECTED.  The SARS-CoV-2 RNA is generally detectable in upper respiratory specimens during the acute phase of infection. The lowest concentration of SARS-CoV-2 viral copies this assay can detect is 138 copies/mL. A negative result does not preclude SARS-Cov-2 infection and should not be used as the sole basis for treatment or other patient management decisions. A negative result may occur with  improper specimen collection/handling, submission of specimen other than nasopharyngeal swab, presence of viral mutation(s) within the areas targeted by this assay, and inadequate number of viral copies(<138 copies/mL). A negative result must be combined with clinical observations, patient history, and epidemiological information. The expected result is Negative.  Fact Sheet for Patients:  EntrepreneurPulse.com.au  Fact Sheet for Healthcare Providers:  IncredibleEmployment.be  This test is no t yet approved or cleared by the [place] FDA and  has been authorized for detection and/or diagnosis of SARS-CoV-2 by FDA under an Emergency Use Authorization (EUA). This EUA will remain  in effect (meaning this test can be used) for the duration of the COVID-19 declaration under Section 564(b)(1) of the Act, 21 U.S.C.section 360bbb-3(b)(1), unless the authorization is terminated  or revoked sooner.       Influenza A by PCR NEGATIVE NEGATIVE   Influenza B by PCR NEGATIVE NEGATIVE    Comment: (NOTE) The Xpert Xpress SARS-CoV-2/FLU/RSV plus assay is intended as an aid in the diagnosis of influenza from Nasopharyngeal swab specimens and should not be used as a sole  basis for treatment. Nasal washings and aspirates are unacceptable for Xpert Xpress SARS-CoV-2/FLU/RSV testing.  Fact Sheet for Patients: EntrepreneurPulse.com.au  Fact Sheet for Healthcare Providers: IncredibleEmployment.be  This test is not yet approved or cleared by the [place] FDA and has been authorized for detection and/or diagnosis of SARS-CoV-2 by FDA under an Emergency Use Authorization (EUA). This EUA will remain in effect (meaning this test can be used) for the duration of the COVID-19 declaration under Section 564(b)(1) of the Act, 21 U.S.C. section 360bbb-3(b)(1), unless the authorization is terminated or revoked.  Performed at [facility], [place] [address], [place], [place] [address]   Urine rapid drug screen (hosp performed)     Status: None   Collection Time: 03/19/22  7:20 PM  Result Value Ref Range   Opiates NONE DETECTED NONE DETECTED   Cocaine NONE DETECTED NONE DETECTED   Benzodiazepines NONE DETECTED NONE DETECTED   Amphetamines NONE DETECTED NONE DETECTED   Tetrahydrocannabinol NONE DETECTED NONE DETECTED   Barbiturates NONE DETECTED NONE DETECTED    Comment: (NOTE) DRUG SCREEN FOR MEDICAL PURPOSES ONLY.  IF CONFIRMATION IS NEEDED FOR ANY PURPOSE, NOTIFY LAB WITHIN 5 DAYS.  LOWEST DETECTABLE LIMITS FOR URINE DRUG  SCREEN Drug Class                     Cutoff (ng/mL) Amphetamine and metabolites    1000 Barbiturate and metabolites    200 Benzodiazepine                 546 Tricyclics and metabolites     300 Opiates and metabolites        300 Cocaine and metabolites        300 THC                            50 Performed at [place] [address], [place], [place] [address]   Urinalysis, Routine w reflex microscopic Anterior Nasal Swab     Status: None   Collection Time: 03/19/22  7:20 PM  Result Value Ref Range   Color, Urine YELLOW YELLOW   APPearance CLEAR CLEAR    Specific Gravity, Urine 1.011 1.005 - 1.030   pH 7.0 5.0 - 8.0   Glucose, UA NEGATIVE NEGATIVE mg/dL   Hgb urine dipstick NEGATIVE NEGATIVE   Bilirubin Urine NEGATIVE NEGATIVE   Ketones, ur NEGATIVE NEGATIVE mg/dL   Protein, ur NEGATIVE NEGATIVE mg/dL   Nitrite NEGATIVE NEGATIVE   Leukocytes,Ua NEGATIVE NEGATIVE    Comment: Performed at [facility], [place] [address] [place], [place] [address]    Current Facility-Administered Medications  Medication Dose Route Frequency Provider Last Rate Last Admin   acetaminophen (TYLENOL) tablet 650 mg  650 mg Oral Q4H PRN Loni Beckwith, PA-C       alum & mag hydroxide-simeth (MAALOX/MYLANTA) 200-200-20 MG/5ML suspension 30 mL  30 mL Oral Q6H PRN Loni Beckwith, PA-C       ARIPiprazole (ABILIFY) tablet 15 mg  15 mg Oral QHS Loni Beckwith, PA-C   15 mg at 03/19/22 2202   furosemide (LASIX) tablet 20 mg  20 mg Oral Daily Hayden Rasmussen, MD   20 mg at 03/20/22 1530   furosemide (LASIX) tablet 20 mg  20 mg Oral Daily PRN Hayden Rasmussen, MD       ondansetron [facility]) tablet 4 mg  4 mg Oral Q8H PRN Loni Beckwith, PA-C       sertraline (ZOLOFT) tablet 25 mg  25 mg Oral Daily Charmaine Downs C, NP   25 mg at 03/20/22 1725   Current Outpatient Medications  Medication Sig Dispense Refill   acetaminophen (TYLENOL) 500 MG tablet Take 500 mg by mouth every 6 (six) hours as needed for mild pain or headache.     ARIPiprazole (ABILIFY) 15 MG tablet Take 1 tablet (15 mg total) by mouth at bedtime. 14 tablet 0   divalproex (DEPAKOTE) 500 MG DR tablet Take 1 tablet (500 mg total) by mouth at bedtime. 14 tablet 0   furosemide (LASIX) 20 MG tablet Take 1 tablet (20 mg total) by mouth as directed. Take 1 tablet in the morning. Can take additional dose if needed for leg swelling and shortness of breath (Patient taking differently: Take 20 mg by mouth See admin instructions. Take 20 mg by mouth in the morning and an  additional 20 mg as needed for leg swelling and shortness of breath) 90 tablet 0   ibuprofen (ADVIL) 200 MG tablet Take 200-400 mg by mouth every 6 (six) hours as needed for mild pain or headache.     metFORMIN (GLUCOPHAGE) 500  MG tablet Take 500 mg by mouth daily with breakfast.     NON FORMULARY Place 1-2 drops into both eyes See admin instructions. Family Care redness relief eye drops- Place 1-2 drops into both eyes three to four times a day as needed for redness     potassium chloride SA (KLOR-CON) 20 MEQ tablet 35mq three times daily for 2 days then 483m po twice daily for 1 week and f/u with PCP for further management of hypokalemia (Patient taking differently: Take 20 mEq by mouth at bedtime.) 60 tablet 0    Musculoskeletal: Strength & Muscle Tone: within normal limits Gait & Station: normal Patient leans: Front   Psychiatric Specialty Exam: Presentation  General Appearance: Appropriate for Environment; Casual  Eye Contact:Poor  Speech:Clear and Coherent; Normal Rate  Speech Volume:Normal  Handedness:Right   Mood and Affect  Mood:Depressed; Anxious  Affect:Congruent   Thought Process  Thought Processes:Coherent; Goal Directed  Descriptions of Associations:Intact  Orientation:Full (Time, Place and Person)  Thought Content:Logical  History of Schizophrenia/Schizoaffective disorder:No  Duration of Psychotic Symptoms:Less than six months  Hallucinations:Hallucinations: Auditory; Command Description of Command Hallucinations: voices telling her not to eat or drink, voices telling not to sleep. Description of Auditory Hallucinations: see above  Ideas of Reference:None  Suicidal Thoughts:Suicidal Thoughts: No  Homicidal Thoughts:Homicidal Thoughts: No   Sensorium  Memory:Immediate Good; Recent Good; Remote Fair  Judgment:Fair  Insight:Good   Executive Functions  Concentration:Good  Attention Span:Good  ReRacineLanguage:Good   Psychomotor Activity  Psychomotor Activity:Psychomotor Activity: Normal   Assets  Assets:Communication Skills; Desire for Improvement; Housing    Sleep  Sleep:Sleep: Good   Physical Exam: Physical Exam Vitals and nursing note reviewed.  Constitutional:      Appearance: Normal appearance.  HENT:     Head: Normocephalic and atraumatic.     Nose: Nose normal.  Cardiovascular:     Rate and Rhythm: Normal rate and regular rhythm.  Pulmonary:     Effort: Pulmonary effort is normal.  Musculoskeletal:        General: Normal range of motion.     Cervical back: Normal range of motion.  Skin:    General: Skin is warm and dry.  Neurological:     Mental Status: She is alert and oriented to person, place, and time.    Review of Systems  Constitutional: Negative.   HENT: Negative.    Eyes: Negative.   Respiratory: Negative.    Cardiovascular: Negative.   Gastrointestinal: Negative.   Genitourinary: Negative.   Musculoskeletal: Negative.   Skin: Negative.   Neurological: Negative.   Endo/Heme/Allergies: Negative.   Psychiatric/Behavioral:  Positive for depression and hallucinations. The patient is nervous/anxious.    Blood pressure 127/84, pulse 66, temperature 97.9 F (36.6 C), temperature source Oral, resp. rate 18, height '5\' 4"'$  (1.626 m), weight 122.5 kg, SpO2 98 %. Body mass index is 46.35 kg/m.  Medical Decision Making: Patient need inpatient Psychiatry hospitalization for stabilization and medication management.  We will seek bed placement at any hospital that has bed.  Depakote is discontinued due to complaint of hypersomnia.  Abilify is increased to 20 mg at bed time for mood.  Sertraline 25 mg daily for Depression.  Problem 1: Bipolar affective disorder severe with Psychosis  Problem 2: Recurrent Major depressive disorder with Psychosis  Disposition:  admit, seek bed placement.  JoDelfin GantNP-PMHNP-BC 03/20/2022 5:45  PM

## 2022-03-20 NOTE — ED Notes (Signed)
Pt resting, respirations equal and unlabored.

## 2022-03-20 NOTE — ED Notes (Signed)
Pt polite with staff, no aggressive behavior. Currently denies any complaints at this time.

## 2022-03-20 NOTE — Progress Notes (Signed)
CSW requested that [facility], RN review pt for inpatient.  Pt meets criteria per Delfin Gant, NP. CSW will assist and follow with placement.    Benjaman Kindler, MSW, [facility] 03/20/2022 11:24 PM

## 2022-03-20 NOTE — Progress Notes (Signed)
Inpatient Behavioral Health Placement  Pt meets inpatient criteria per ,Delfin Gant, NP.  There are no available beds at [facility] per [facility] Wynonia Hazard, RN. Referral was sent to the following facilities;   Destination Service Provider Address Phone Fax  [place] [place] [address] [place]  [facility]  [address] [place] [address] [phone]  [place]  [address], [place] [address] [place]  [facility]  [address] [place] [address] [phone] [facility]  [place], [place] [address] [phone]  [place] [address] [place] [address] [phone] [facility]  [address], [place] [address] [phone] [place]  [address], [place] [address] [phone] [facility]  [address], [place] [address] [phone]  [facility]  [address] [place] [address] [phone] [facility]  [address], [place] [address] [phone] [facility]  [place], [place] [address] [phone] [facility]  [address] [place] [address] [phone]    Situation ongoing,  CSW will follow up.   Benjaman Kindler, MSW, [facility] 03/20/2022  @ 11:47 PM

## 2022-03-20 NOTE — ED Notes (Signed)
Pt was informed of [place] regarding dressing out into hospital attire, all belongings to be placed into belonging bags and labeled with pt ID. Pt acknowledged these instructions I provided. Pt had no further questions or concerns at this time.

## 2022-03-20 NOTE — ED Notes (Signed)
Pt's belongings, light brown handbag, floral bag, and brown pocketbook placed into 23-25 belongings cabinet.

## 2022-03-20 NOTE — Consult Note (Signed)
Patient is no where to be found in the ER after she was moved to [place] Nurse is aware.

## 2022-03-21 ENCOUNTER — Emergency Department
Admission: EM | Admit: 2022-03-21 | Discharge: 2022-03-21 | Disposition: A | Discharge status: Home / Self Care | Payer: Medicare HMO | Attending: Student in an Organized Health Care Education/Training Program | Admitting: Student in an Organized Health Care Education/Training Program

## 2022-03-21 ENCOUNTER — Other Ambulatory Visit: Payer: Self-pay

## 2022-03-21 DIAGNOSIS — F3131 Bipolar disorder, current episode depressed, mild: Secondary | ICD-10-CM | POA: Insufficient documentation

## 2022-03-21 DIAGNOSIS — F32A Depression, unspecified: Secondary | ICD-10-CM

## 2022-03-21 DIAGNOSIS — Z79899 Other long term (current) drug therapy: Secondary | ICD-10-CM | POA: Insufficient documentation

## 2022-03-21 LAB — CBC
HCT: 37.5 % (ref 36.0–46.0)
Hemoglobin: 12.3 g/dL (ref 12.0–15.0)
MCH: 32.4 pg (ref 26.0–34.0)
MCHC: 32.8 g/dL (ref 30.0–36.0)
MCV: 98.7 fL (ref 80.0–100.0)
Platelets: 180 10*3/uL (ref 150–400)
RBC: 3.8 MIL/uL — ABNORMAL LOW (ref 3.87–5.11)
RDW: 12.5 % (ref 11.5–15.5)
WBC: 6.6 10*3/uL (ref 4.0–10.5)
nRBC: 0 % (ref 0.0–0.2)

## 2022-03-21 LAB — COMPREHENSIVE METABOLIC PANEL
ALT: 14 U/L (ref 0–44)
AST: 19 U/L (ref 15–41)
Albumin: 3.3 g/dL — ABNORMAL LOW (ref 3.5–5.0)
Alkaline Phosphatase: 50 U/L (ref 38–126)
Anion gap: 9 (ref 5–15)
BUN: 20 mg/dL (ref 8–23)
CO2: 30 mmol/L (ref 22–32)
Calcium: 8.9 mg/dL (ref 8.9–10.3)
Chloride: 100 mmol/L (ref 98–111)
Creatinine, Ser: 0.96 mg/dL (ref 0.44–1.00)
GFR, Estimated: >60 mL/min (ref >60)
Glucose, Bld: 109 mg/dL — ABNORMAL HIGH (ref 70–99)
Potassium: 3.4 mmol/L — ABNORMAL LOW (ref 3.5–5.1)
Sodium: 139 mmol/L (ref 135–145)
Total Bilirubin: 0.4 mg/dL (ref 0.3–1.2)
Total Protein: 7.2 g/dL (ref 6.5–8.1)

## 2022-03-21 LAB — URINE DRUG SCREEN, QUALITATIVE (ARMC ONLY)
Amphetamines, Ur Screen: NOT DETECTED
Barbiturates, Ur Screen: NOT DETECTED
Benzodiazepine, Ur Scrn: NOT DETECTED
Cannabinoid 50 Ng, Ur ~~LOC~~: NOT DETECTED
Cocaine Metabolite,Ur ~~LOC~~: NOT DETECTED
MDMA (Ecstasy)Ur Screen: NOT DETECTED
Methadone Scn, Ur: NOT DETECTED
Opiate, Ur Screen: NOT DETECTED
Phencyclidine (PCP) Ur S: NOT DETECTED
Tricyclic, Ur Screen: NOT DETECTED

## 2022-03-21 LAB — ETHANOL: Alcohol, Ethyl (B): <10 mg/dL (ref <10)

## 2022-03-21 NOTE — ED Notes (Signed)
I provided reinforced discharge education based off of discharge instructions. Pt acknowledged and understood my education. Pt had no further questions/concerns for provider/myself.  °

## 2022-03-21 NOTE — ED Triage Notes (Signed)
Pt to ED with family, pt requested for family to bring her to ED for psych eval because she states she has depression and has been doing things in the house that don't make sense. Pt in triage accompanied by son. Sister of pt was in waiting room, tried to come into triage room despite being told by first nurse that only one family member can accompany in triage. Pt states she does not really want sister to come back because she makes her feel agitated.   Pt states she makes her own medical decisions and also has healthcare POA who is her daughter [name]).  Pt states has not been caring for herself like brushing hair. States is eating but less than should. Is hearing voices but not sure what they are saying.  Pt states used to take meds for bipolar depression, has not been taking them for a year at least. Pt asking to see psychiatrist to be restarted on meds and hopefully get back to being functional so she can discharge home and take care of herself. Pt is oriented times 4 and has insight into her depression.

## 2022-03-21 NOTE — ED Notes (Signed)
Attempted to call [facility] to provide report to pt, no answer. Left voicemail with callback number.    Report can be called to: [phone]

## 2022-03-21 NOTE — ED Notes (Signed)
Dr.Sheldon on phone with pt daughter [name] regarding pt's current status.

## 2022-03-21 NOTE — ED Provider Notes (Signed)
Emergency Medicine Observation Re-evaluation Note  Jodi [name] is a 61 y.o. female, seen on rounds today.  Pt initially presented to the ED for complaints of Schizophrenia and Medication Concerns Currently, the patient is quiet and cooperative.  Physical Exam  BP 108/66   Pulse (!) 50   Temp 97.9 F (36.6 C) (Oral)   Resp 16   Ht '5\' 4"'$  (1.626 m)   Wt 122.5 kg   SpO2 98%   BMI 46.35 kg/m  Physical Exam General: Awake and alert Lungs: Resp even and unlabored Psych: calm cooperative  ED Course / MDM  EKG:EKG Interpretation  Date/Time:  Sunday March 19 2022 16:40:45 EDT Ventricular Rate:  57 PR Interval:  189 QRS Duration: 77 QT Interval:  544 QTC Calculation: 530 R Axis:   46 Text Interpretation: Sinus rhythm Low voltage, precordial leads Borderline T abnormalities, diffuse leads Prolonged QT interval Since last tracing PR is shorter and QT is longer Otherwise no significant change Confirmed by Daleen Bo [phone]) on 03/19/2022 8:13:20 PM  I have reviewed the labs performed to date as well as medications administered while in observation.  Recent changes in the last 24 hours include none.  Plan  Current plan is for admission, accepted at [facility], who requested IVC. Patient's daughter was called with this plan by ED RN and is upset that she is going to a facility that far away. She would like to come get the patient and is comfortably managing her symptoms at home with outpatient follow up. She understands that placement into an inpatient psychiatric facility requires consideration of multiple factors including bed availability and that if she continues to have trouble, to bring her back to the ED for re-evaluation. Patient herself denies SI/HI or AVH. She has been calm, cooperative with staff and not a danger to others. Jodi [name] is not under involuntary commitment, IVC papers had been filled out but had not been filed with the magistrate yet.        Truddie Hidden, MD 03/21/22 [phone]

## 2022-03-21 NOTE — Progress Notes (Signed)
Pt was accepted to [facility] 03/21/22; Bed Assignment Lorrin Goodell unit [facility] is requesting that pt be IVC'd. First shift disposition counselor, Jalene Mullet to follow up.   Pt meets inpatient criteria per Delfin Gant, NP.  Attending Physician will be Dr.Rano Juliane Lack  Report can be called to: [phone]  Pt can arrive after 8:00am  Care Team notified:Bratu, Cathlean Cower, EMT-P  Nadara Mode, [place] 03/21/2022 @ 12:07 AM

## 2022-03-21 NOTE — ED Notes (Signed)
VOL/  PENDING  CONSULT 
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
## 2022-03-21 NOTE — ED Notes (Signed)
Patients daughter, [name], wanted an update on patient and patient gave permission for staff to. Patients daughter expressed concern about [place] Daughter said she did not know patient was accepted to a facility or updated on the plan of care. Dr. Karle Starch on the phone with daughter.

## 2022-03-21 NOTE — ED Notes (Signed)
Spoke with quad nurse. Pt will start in 24H. Pt states would want to have her sister with her for company. Told pt will get her settled in first. Pt and son have several questions, son stating that pt has long history of depression. Son states she is about halfway as bad as she has been at other times, looking at her house and how she was taking care of herself. Son stating he is concerned that if pt does not give full history, she may not get full evaluation/tx and discharge home and not be able to care for self.  Son asking for her to be monitored here until back at baseline to discharge safely home. Pt lives alone. Pt agrees with son's statements.

## 2022-03-21 NOTE — ED Notes (Signed)
Pt wishing to leave. Sister at bedside reports she feels safe with patient coming home as well as son. Pt denies active SI/HI, AH/VH. Pt reports she is going to follow up with outpatient psychiatrist. Theodoro Clock not working at this time. Pt verbalized understanding of D/C instructions, prescriptions and follow up care with no further questions at this time. Pt in NAD and ambulatory at time of D/C.

## 2022-03-21 NOTE — ED Provider Notes (Signed)
[facility] Provider Note    Event Date/Time   First MD Initiated Contact with Patient 03/21/22 1724     (approximate)   History   Psychiatric Evaluation   HPI  Jodi [name] is a 61 y.o. female presents with family for psychiatric evaluation.  Patient was just over [place] long and had been recommended for inpatient treatment but family did not want her to be taken so far away from home for inpatient therefore they took the patient home.  She was recently started on Zoloft and taken off her Depakote.  She does have these medications at home.  Family wanted her to be evaluated here as she has been admitted inpatient here before wanting to see if she could be admitted here instead of going so far away from home.     Physical Exam   Triage Vital Signs: ED Triage Vitals [03/21/22 1656]  Enc Vitals Group     BP 115/86     Pulse Rate 61     Resp 16     Temp 98.5 F (36.9 C)     Temp Source Oral     SpO2 94 %     Weight 260 lb (117.9 kg)     Height '5\' 4"'$  (1.626 m)     Head Circumference      Peak Flow      Pain Score 0     Pain Loc      Pain Edu?      Excl. in [place]?     Most recent vital signs: Vitals:   03/21/22 1656  BP: 115/86  Pulse: 61  Resp: 16  Temp: 98.5 F (36.9 C)  SpO2: 94%     Constitutional: Alert  Eyes: Conjunctivae are normal.  Head: Atraumatic. Nose: No congestion/rhinnorhea. Mouth/Throat: Mucous membranes are moist.   Neck: Painless ROM.  Cardiovascular:   Good peripheral circulation. Respiratory: Normal respiratory effort.  No retractions.  Gastrointestinal: Soft and nontender.  Musculoskeletal:  no deformity Neurologic:  MAE spontaneously. No gross focal neurologic deficits are appreciated.  Skin:  Skin is warm, dry and intact. No rash noted. Psychiatric: calm and cooperative    ED Results / Procedures / Treatments   Labs (all labs ordered are listed, but only abnormal results are displayed) Labs  Reviewed  COMPREHENSIVE METABOLIC PANEL - Abnormal; Notable for the following components:      Result Value   Potassium 3.4 (*)    Glucose, Bld 109 (*)    Albumin 3.3 (*)    All other components within normal limits  CBC - Abnormal; Notable for the following components:   RBC 3.80 (*)    All other components within normal limits  ETHANOL  URINE DRUG SCREEN, QUALITATIVE ([facility] ONLY)     EKG     RADIOLOGY    PROCEDURES:  Critical Care performed:   Procedures   MEDICATIONS ORDERED IN ED: Medications - No data to display   IMPRESSION / MDM / [place] / ED COURSE  I reviewed the triage vital signs and the nursing notes.                              Differential diagnosis includes, but is not limited to, Psychosis, delirium, medication effect, noncompliance, polysubstance abuse, Si, Hi, depression  Patient presenting for the above complaints.  On arrival here with family does not appear to meet criteria for IVC at  this point.  Will consult psychiatry as well as TTS to see if there are any other options for inpatient locally.  We will keep voluntary.  Clinical Course as of 03/21/22 2022  Tue Mar 21, 2022  1952 Patient and family stating they no longer want to wait.  They have outpatient follow-up with psychiatry.  Patient's family feels comfortable with her going home.  Do not see indication for IVC at this time. [PR]    Clinical Course User Index [PR] Merlyn Lot, MD     FINAL CLINICAL IMPRESSION(S) / ED DIAGNOSES   Final diagnoses:  Depression, unspecified depression type     Rx / DC Orders   ED Discharge Orders     None        Note:  This document was prepared using Dragon voice recognition software and may include unintentional dictation errors.    Merlyn Lot, MD 03/21/22 2023

## 2022-03-21 NOTE — ED Notes (Signed)
Spoke to pt and son at her bedside. Pt's son explained that they refused inpatient tx 2 days ago from [place] d/t distance to tx facility.  Staff explained to pt and family that inpatient beds are limited and there is no guarantee of open beds at any specific facility. Staff explained that psych provider will be in the ED after 2000 tonight and they will be seen then. Cont to monitor as ordered

## 2022-04-10 ENCOUNTER — Other Ambulatory Visit: Payer: Self-pay

## 2022-08-31 ENCOUNTER — Other Ambulatory Visit: Payer: Self-pay

## 2022-08-31 ENCOUNTER — Emergency Department (HOSPITAL_COMMUNITY)
Admission: EM | Admit: 2022-08-31 | Discharge: 2022-08-31 | Discharge status: Against Medical Advice | Payer: Medicare Other | Attending: Emergency Medicine | Admitting: Emergency Medicine

## 2022-08-31 ENCOUNTER — Encounter: Payer: Self-pay | Admitting: Hematology

## 2022-08-31 DIAGNOSIS — Z5321 Procedure and treatment not carried out due to patient leaving prior to being seen by health care provider: Secondary | ICD-10-CM | POA: Insufficient documentation

## 2022-08-31 DIAGNOSIS — M79604 Pain in right leg: Secondary | ICD-10-CM | POA: Diagnosis present

## 2022-08-31 DIAGNOSIS — M79651 Pain in right thigh: Secondary | ICD-10-CM | POA: Insufficient documentation

## 2022-08-31 DIAGNOSIS — M25561 Pain in right knee: Secondary | ICD-10-CM | POA: Diagnosis not present

## 2022-08-31 NOTE — ED Triage Notes (Addendum)
Pt.STATED, IM  HAVING LEG PAIN AND MOSTLY THE RIGHT , STARTED A WEEK AGO. Its mostly the knees and upper thigh

## 2022-08-31 NOTE — ED Notes (Signed)
Called for pt in [place], no answer x 1

## 2022-08-31 NOTE — ED Notes (Signed)
Patient called x3 no response

## 2022-09-06 ENCOUNTER — Other Ambulatory Visit: Payer: Self-pay

## 2022-09-08 ENCOUNTER — Encounter (HOSPITAL_COMMUNITY): Payer: Self-pay | Admitting: Emergency Medicine

## 2022-09-08 ENCOUNTER — Ambulatory Visit (HOSPITAL_COMMUNITY)
Admission: EM | Admit: 2022-09-08 | Discharge: 2022-09-08 | Disposition: A | Discharge status: Home / Self Care | Payer: Medicare Other | Attending: Family Medicine | Admitting: Family Medicine

## 2022-09-08 DIAGNOSIS — M79604 Pain in right leg: Secondary | ICD-10-CM | POA: Diagnosis not present

## 2022-09-08 MED ORDER — KETOROLAC TROMETHAMINE 30 MG/ML IJ SOLN
INTRAMUSCULAR | Status: AC
  Filled 2022-09-08: qty 1

## 2022-09-08 MED ORDER — KETOROLAC TROMETHAMINE 30 MG/ML IJ SOLN: 30.0000 mg | Freq: Once | INTRAMUSCULAR | Status: DC

## 2022-09-08 MED ORDER — DOXYCYCLINE HYCLATE 100 MG PO CAPS: 100.0000 mg | ORAL_CAPSULE | Freq: Two times a day (BID) | ORAL | 0 refills | Status: AC

## 2022-09-08 MED ORDER — TRAMADOL HCL 50 MG PO TABS: 50.0000 mg | ORAL_TABLET | Freq: Four times a day (QID) | ORAL | 0 refills | Status: DC | PRN

## 2022-09-08 NOTE — Discharge Instructions (Addendum)
You have been given a shot of Toradol 30 mg today.  Take tramadol 50 mg-- 1 tablet every 6 hours as needed for pain.  This medication can make you sleepy or dizzy  Take doxycycline 100 mg --1 capsule 2 times daily for 7 days; this is an antibiotic in case there is infection in the skin of your right leg.

## 2022-09-08 NOTE — ED Triage Notes (Signed)
Pt c/o right leg pain, swelling at ankle and thig areas for couple weeks. Denies any falls or injury.

## 2022-09-08 NOTE — ED Provider Notes (Addendum)
[place]    CSN: [id number] Arrival date & time: 09/08/22  1438      History   Chief Complaint Chief Complaint  Patient presents with   Leg Pain    HPI Jodi [name] is a 61 y.o. female.    Leg Pain  Here for right thigh and medial right leg pain. No trauma noted.  The pain has been going on for about 2 weeks.  Is hurt worse when she bears weight on it.  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  She has a history of diabetes and heart failure.  She did go to the emergency room on December 14 and left without being seen after several hours of waiting.  No chest pain and no shortness of breath.  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   Moderate to severe pulmonary hypertension ([place])    Morbid obesity (HCC)    Obesity hypoventilation syndrome (HCC)    On home oxygen therapy    PRN   Pickwickian syndrome ([place])    Thoracic aortic aneurysm [facility])     Patient Active Problem List   Diagnosis Date Noted   Severe bipolar affective disorder with psychosis ([place]) 03/20/2022   Schizophrenia ([place]) 03/20/2022   Major depressive disorder, recurrent, 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   Counseling regarding advance care planning and goals of care 06/05/2019   Goals of care, counseling/discussion 01/17/2019   Malignant neoplasm of maxillary sinus ([place]) 02/13/2017   Bipolar disorder, in partial remission, most recent episode manic ([place])    Pulmonary hypertension ([place])    Acute respiratory failure  with hypoxia and hypercarbia ([place])    Morbid obesity due to excess calories (HCC)    Nocturnal hypoxemia due to obesity    Acute on chronic diastolic (congestive) heart failure ([place]) 12/03/2015   CHF (congestive heart failure) ([place]) 11/18/2015   Non compliance with medical treatment 09/16/2015   Bipolar disorder ([place]) 09/15/2015   Type 2 diabetes mellitus ([place]) 09/15/2015   Morbid obesity ([place]) 09/15/2015    Past Surgical History:  Procedure Laterality Date   RIGHT OOPHORECTOMY     SINUS ENDO WITH FUSION N/A 02/13/2017   Procedure: ENDOSCOPIC SINUS ENDO WITH FUSION;  Surgeon: Rozetta Nunnery, MD;  Location: [place];  Service: ENT;  Laterality: N/A;   SINUSOTOMY Right 02/13/2017   Procedure: SINUSOTOMY VIA CALDWELL [place];  Surgeon: Rozetta Nunnery, MD;  Location[facility] OR;  Service: ENT;  Laterality: Right;   TONSILLECTOMY      OB History   No obstetric history on file.      Home Medications    Prior to Admission medications   Medication Sig Start Date End Date Taking? Authorizing Provider  doxycycline (VIBRAMYCIN) 100 MG capsule Take 1 capsule (100 mg total) by mouth 2 (two) times daily for 7 days. 09/08/22 09/15/22 Yes Juliane Poot  K, MD  traMADol (ULTRAM) 50 MG tablet Take 1 tablet (50 mg total) by mouth every 6 (six) hours as needed (pain). 09/08/22  Yes Barrett Henle, MD  acetaminophen (TYLENOL) 500 MG tablet Take 500 mg by mouth every 6 (six) hours as needed for mild pain or headache.    [provider]  ARIPiprazole (ABILIFY) 15 MG tablet Take 1 tablet (15 mg total) by mouth at bedtime. 02/26/22   Blue, Soijett A, PA-C  divalproex (DEPAKOTE) 500 MG DR tablet Take 1 tablet (500 mg total) by mouth at bedtime. 02/26/22   Blue, Soijett A, PA-C  furosemide (LASIX) 20 MG tablet Take 1 tablet (20 mg total) by mouth as directed. Take 1 tablet in the morning. Can take additional dose if needed for leg swelling and shortness of breath Patient taking differently:  Take 20 mg by mouth See admin instructions. Take 20 mg by mouth in the morning and an additional 20 mg as needed for leg swelling and shortness of breath 12/23/20 03/19/22  Adrian Prows, MD  ibuprofen (ADVIL) 200 MG tablet Take 200-400 mg by mouth every 6 (six) hours as needed for mild pain or headache.    [provider]  metFORMIN (GLUCOPHAGE) 500 MG tablet Take 500 mg by mouth daily with breakfast.    [provider]  NON FORMULARY Place 1-2 drops into both eyes See admin instructions. [facility] redness relief eye drops- Place 1-2 drops into both eyes three to four times a day as needed for redness    [provider]  potassium chloride SA (KLOR-CON) 20 MEQ tablet 88mq three times daily for 2 days then 485m po twice daily for 1 week and f/u with PCP for further management of hypokalemia Patient taking differently: Take 20 mEq by mouth at bedtime. 06/24/19   KaBrunetta GeneraMD    Family History Family History  Problem Relation Age of Onset   Cancer Father    Diabetes Other    Stroke Mother    Diabetes Mother     Social History Social History   Tobacco Use   Smoking status: Former    Types: Cigarettes    Quit date: 09/19/2003    Years since quitting: 18.9   Smokeless tobacco: Never   Tobacco comments:    greater than 14 years ago  Vaping Use   Vaping Use: Never used  Substance Use Topics   Alcohol use: Yes    Alcohol/week: 0.0 standard drinks of alcohol    Comment: rarely   Drug use: Never     Allergies   Penicillins   Review of Systems Review of Systems   Physical Exam Triage Vital Signs ED Triage Vitals  Enc Vitals Group     BP 09/08/22 1547 (!) 163/77     Pulse Rate 09/08/22 1547 (!) 55     Resp 09/08/22 1547 19     Temp 09/08/22 1547 97.9 F (36.6 C)     Temp Source 09/08/22 1547 Oral     SpO2 09/08/22 1547 95 %     Weight --      Height --      Head Circumference --      Peak Flow --      Pain Score 09/08/22 1546 8      Pain Loc --      Pain Edu? --      Excl. in [place]--    No data found.  Updated Vital Signs BP (![name]  163/77 (BP Location: Right Arm)   Pulse (!) 55   Temp 97.9 F (36.6 C) (Oral)   Resp 19   SpO2 95%   Visual Acuity Right Eye Distance:   Left Eye Distance:   Bilateral Distance:    Right Eye Near:   Left Eye Near:    Bilateral Near:     Physical Exam Vitals reviewed.  Constitutional:      General: She is not in acute distress.    Appearance: She is not ill-appearing, toxic-appearing or diaphoretic.  HENT:     Mouth/Throat:     Mouth: Mucous membranes are moist.  Cardiovascular:     Rate and Rhythm: Normal rate and regular rhythm.     Heart sounds: No murmur heard. Pulmonary:     Effort: Pulmonary effort is normal. No respiratory distress.     Breath sounds: No stridor. No wheezing.  Musculoskeletal:     Cervical back: Neck supple.     Comments: There are some tenderness of the medial right thigh possibly some induration and erythema.  She is not so tender over the knee.  Lymphadenopathy:     Cervical: No cervical adenopathy.  Skin:    Capillary Refill: Capillary refill takes less than 2 seconds.     Coloration: Skin is not jaundiced or pale.  Neurological:     General: No focal deficit present.     Mental Status: She is alert and oriented to person, place, and time.  Psychiatric:        Behavior: Behavior normal.      UC Treatments / Results  Labs (all labs ordered are listed, but only abnormal results are displayed) Labs Reviewed - No data to display  EKG   Radiology No results found.  Procedures Procedures (including critical care time)  Medications Ordered in UC Medications - No data to display   Initial Impression / Assessment and Plan / UC Course  I have reviewed the triage vital signs and the nursing notes.  Pertinent labs & imaging results that were available during my care of the patient were reviewed by me and considered in my medical  decision making (see chart for details).        Her last EGFR was greater than 60.  She is given 1 shot of Toradol here in the clinic, and I have sent tramadol in for pain.  Venous Dopplers ordered to be done tomorrow at [facility].  This is Saturday, but staff is calling to make sure that is going to work.  Antibiotics are sent in case this is a cellulitis.   When staff went to give her the Toradol, she stated she did not want it.  Then she said she could possibly go for the Doppler tomorrow that I had already ordered.  She stated she could go on Monday; of note that is Christmas Day.  I have canceled the Doppler order and staff is instructing her to please present to the emergency room when she thinks she is ready to get anything done.  When staff went back in to finish discharging the patient, the patient decided she could not go tomorrow, Saturday, to have her Doppler done.  Doppler is reordered Final Clinical Impressions(s) / UC Diagnoses   Final diagnoses:  Right leg pain     Discharge Instructions      You have been given a shot of Toradol 30 mg today.  Take tramadol 50 mg-- 1 tablet every 6 hours as needed  for pain.  This medication can make you sleepy or dizzy  Take doxycycline 100 mg --1 capsule 2 times daily for 7 days; this is an antibiotic in case there is infection in the skin of your right leg.      ED Prescriptions     Medication Sig Dispense Auth. Provider   doxycycline (VIBRAMYCIN) 100 MG capsule Take 1 capsule (100 mg total) by mouth 2 (two) times daily for 7 days. 14 capsule Lyndel Dancel, Gwenlyn Perking, MD   traMADol (ULTRAM) 50 MG tablet Take 1 tablet (50 mg total) by mouth every 6 (six) hours as needed (pain). 12 tablet Nicholes Hibler, Gwenlyn Perking, MD      I have reviewed the PDMP during this encounter.   Barrett Henle, MD 09/08/22 1622    Barrett Henle, MD 09/08/22 1627    Barrett Henle, MD 09/08/22 1630

## 2022-09-09 ENCOUNTER — Ambulatory Visit (HOSPITAL_COMMUNITY): Payer: Medicare Other | Attending: Family Medicine

## 2022-09-11 ENCOUNTER — Other Ambulatory Visit: Payer: Self-pay

## 2022-10-19 ENCOUNTER — Emergency Department (HOSPITAL_COMMUNITY)
Admission: EM | Admit: 2022-10-19 | Discharge: 2022-10-19 | Disposition: A | Discharge status: Home / Self Care | Payer: Medicare HMO | Attending: Emergency Medicine | Admitting: Emergency Medicine

## 2022-10-19 ENCOUNTER — Encounter (HOSPITAL_COMMUNITY): Payer: Self-pay | Admitting: Emergency Medicine

## 2022-10-19 ENCOUNTER — Emergency Department (HOSPITAL_COMMUNITY): Payer: Medicare HMO

## 2022-10-19 ENCOUNTER — Encounter: Payer: Self-pay | Admitting: Hematology

## 2022-10-19 DIAGNOSIS — Z8522 Personal history of malignant neoplasm of nasal cavities, middle ear, and accessory sinuses: Secondary | ICD-10-CM

## 2022-10-19 DIAGNOSIS — Z7984 Long term (current) use of oral hypoglycemic drugs: Secondary | ICD-10-CM | POA: Diagnosis not present

## 2022-10-19 DIAGNOSIS — R930 Abnormal findings on diagnostic imaging of skull and head, not elsewhere classified: Secondary | ICD-10-CM | POA: Insufficient documentation

## 2022-10-19 DIAGNOSIS — R040 Epistaxis: Secondary | ICD-10-CM

## 2022-10-19 LAB — CBC
HCT: 34.1 % — ABNORMAL LOW (ref 36.0–46.0)
Hemoglobin: 10.8 g/dL — ABNORMAL LOW (ref 12.0–15.0)
MCH: 31.6 pg (ref 26.0–34.0)
MCHC: 31.7 g/dL (ref 30.0–36.0)
MCV: 99.7 fL (ref 80.0–100.0)
Platelets: 193 10*3/uL (ref 150–400)
RBC: 3.42 MIL/uL — ABNORMAL LOW (ref 3.87–5.11)
RDW: 12.8 % (ref 11.5–15.5)
WBC: 6.4 10*3/uL (ref 4.0–10.5)
nRBC: 0 % (ref 0.0–0.2)

## 2022-10-19 LAB — SAMPLE TO BLOOD BANK

## 2022-10-19 LAB — BASIC METABOLIC PANEL
Anion gap: 9 (ref 5–15)
BUN: 31 mg/dL — ABNORMAL HIGH (ref 8–23)
CO2: 30 mmol/L (ref 22–32)
Calcium: 8.7 mg/dL — ABNORMAL LOW (ref 8.9–10.3)
Chloride: 101 mmol/L (ref 98–111)
Creatinine, Ser: 0.84 mg/dL (ref 0.44–1.00)
GFR, Estimated: >60 mL/min (ref >60)
Glucose, Bld: 101 mg/dL — ABNORMAL HIGH (ref 70–99)
Potassium: 3.2 mmol/L — ABNORMAL LOW (ref 3.5–5.1)
Sodium: 140 mmol/L (ref 135–145)

## 2022-10-19 LAB — HEMOGLOBIN AND HEMATOCRIT, BLOOD
HCT: 31.6 % — ABNORMAL LOW (ref 36.0–46.0)
Hemoglobin: 9.9 g/dL — ABNORMAL LOW (ref 12.0–15.0)

## 2022-10-19 MED ORDER — LIDOCAINE-EPINEPHRINE 1 %-1:100000 IJ SOLN
20.0000 mL | Freq: Once | INTRAMUSCULAR | Status: AC
  Administered 2022-10-19: 20 mL
  Filled 2022-10-19: qty 20

## 2022-10-19 MED ORDER — OXYMETAZOLINE HCL 0.05 % NA SOLN
1.0000 | Freq: Once | NASAL | Status: AC
  Administered 2022-10-19: 1 via NASAL
  Filled 2022-10-19: qty 30

## 2022-10-19 MED ORDER — IOHEXOL 350 MG/ML SOLN
75.0000 mL | Freq: Once | INTRAVENOUS | Status: AC | PRN
  Administered 2022-10-19: 75 mL via INTRAVENOUS

## 2022-10-19 MED ORDER — TRANEXAMIC ACID FOR EPISTAXIS
500.0000 mg | Freq: Once | TOPICAL | Status: AC
  Administered 2022-10-19: 500 mg via TOPICAL
  Filled 2022-10-19: qty 10

## 2022-10-19 NOTE — ED Triage Notes (Signed)
Pt here from home with c/o nose bleed left side , no blood thinners , hx of same

## 2022-10-19 NOTE — ED Provider Notes (Signed)
[place] EMERGENCY DEPARTMENT AT [facility] Provider Note   CSN: [id number] Arrival date & time: 10/19/22  1051     History  No chief complaint on file.   Lateasha Wynonna Fitzhenry is a 62 y.o. female.  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  Says that for the past week she has had intermittent nosebleeding but that it worsened today at 8 AM.  Says it has been persistent since then.  Has been predominantly from her left nose.  Also with mild shortness of breath at this time but denies any dizziness, fainting, or chest discomfort.  Not on blood thinners or aspirin.  Did try taking ibuprofen for the pain but did not help.  Previously was on oxygen for pulmonary hypertension but states that she now does not wear oxygen.  Follows with Dr. Barton Dubois from ENT at [facility].       Home Medications Prior to Admission medications   Medication Sig Start Date End Date Taking? Authorizing Provider  acetaminophen (TYLENOL) 500 MG tablet Take 500 mg by mouth every 6 (six) hours as needed for mild pain or headache.    [provider]  ARIPiprazole (ABILIFY) 15 MG tablet Take 1 tablet (15 mg total) by mouth at bedtime. 02/26/22   Blue, Soijett A, PA-C  divalproex (DEPAKOTE) 500 MG DR tablet Take 1 tablet (500 mg total) by mouth at bedtime. 02/26/22   Blue, Soijett A, PA-C  furosemide (LASIX) 20 MG tablet Take 1 tablet (20 mg total) by mouth as directed. Take 1 tablet in the morning. Can take additional dose if needed for leg swelling and shortness of breath Patient taking differently: Take 20 mg by mouth See admin instructions. Take 20 mg by mouth in the morning and an additional 20 mg as needed for leg swelling and shortness of breath 12/23/20 03/19/22  Adrian Prows, MD  ibuprofen (ADVIL) 200 MG tablet Take 200-400 mg by mouth every 6 (six) hours as needed for mild pain or headache.    [provider]  metFORMIN (GLUCOPHAGE) 500 MG tablet Take 500 mg by mouth daily with breakfast.    [provider]  NON FORMULARY Place 1-2 drops into both eyes See admin instructions. [facility] redness relief eye drops- Place 1-2 drops into both eyes three to four times a day as needed for redness    [provider]  potassium chloride SA (KLOR-CON) 20 MEQ tablet 90mq three times daily for 2 days then 450m po twice daily for 1 week and f/u with PCP for further management of hypokalemia Patient taking differently: Take 20 mEq by mouth at bedtime. 06/24/19   KaBrunetta GeneraMD  traMADol (ULTRAM) 50 MG tablet Take 1 tablet (50 mg total) by mouth every 6 (six) hours as needed (pain). 09/08/22   BaBarrett HenleMD      Allergies    Penicillins    Review of Systems   Review of Systems  Physical Exam Updated Vital Signs BP 124/68 (BP Location: Left Arm)   Pulse (!) 51   Temp 98 F (36.7 C) (Oral)   Resp 19   SpO2 98%  Physical Exam Vitals and nursing note reviewed.  Constitutional:      General: She is not in acute distress.    Appearance: She is well-developed.  HENT:     Head: Atraumatic.     Comments: Sunken maxillary region bilaterally.  Nose:     Comments: Active bleeding from bilateral naris predominantly from left nare. Eyes:     Conjunctiva/sclera: Conjunctivae normal.  Cardiovascular:     Rate and Rhythm: Normal rate and regular rhythm.  Pulmonary:     Effort: Pulmonary effort is normal. No respiratory distress.  Neurological:     General: No focal deficit present.     Mental Status: She is alert and oriented to person, place, and time. Mental status is at baseline.  Psychiatric:        Mood and Affect: Mood normal.     ED Results / Procedures / Treatments   Labs (all labs ordered are listed, but only abnormal results are displayed) Labs Reviewed  CBC - Abnormal; Notable for the following components:      Result Value   RBC 3.42 (*)     Hemoglobin 10.8 (*)    HCT 34.1 (*)    All other components within normal limits  BASIC METABOLIC PANEL - Abnormal; Notable for the following components:   Potassium 3.2 (*)    Glucose, Bld 101 (*)    BUN 31 (*)    Calcium 8.7 (*)    All other components within normal limits  HEMOGLOBIN AND HEMATOCRIT, BLOOD - Abnormal; Notable for the following components:   Hemoglobin 9.9 (*)    HCT 31.6 (*)    All other components within normal limits  SAMPLE TO BLOOD BANK    EKG None  Radiology CT Maxillofacial W Contrast  Result Date: 10/19/2022 CLINICAL DATA:  Epistaxis EXAM: CT MAXILLOFACIAL WITH CONTRAST; CT ANGIOGRAPHY HEAD TECHNIQUE: Multidetector CT imaging of the maxillofacial structures was performed with intravenous contrast. Multiplanar CT image reconstructions were also generated. Multidetector CT imaging of the head was performed using the standard protocol during bolus administration of intravenous contrast. Multiplanar CT image reconstructions and MIPs were obtained to evaluate the vascular anatomy. RADIATION DOSE REDUCTION: This exam was performed according to the departmental dose-optimization program which includes automated exposure control, adjustment of the mA and/or kV according to patient size and/or use of iterative reconstruction technique. CONTRAST:  68m OMNIPAQUE IOHEXOL 350 MG/ML SOLN COMPARISON:  CT Head 01/09/17 FINDINGS: CT HEAD Brain: No evidence of acute infarction, hemorrhage, hydrocephalus, extra-axial collection or mass lesion/mass effect. Vascular: No hyperdense vessel or unexpected calcification. Skull: Normal. Negative for fracture or focal lesion. Sinuses: No mastoid or middle ear effusion. The right maxillary sinus is essentially absent. A small portion of the alveolar ridge remains. The right frontal sinus is completely opacified and has a more expansile appearance compared to the preoperative CT dated 01/09/2017. There has also been interval bilateral inferior  ethmoidectomies. There osseous findings suggestive of chronic right ethmoid and sphenoid sinusitis. There are small amount of hyperdense secretions along the anterior aspect of the ethmoid air cells and in the left sphenoid sinus. CT FACE Osseous: Chronic right lamina papyracea fracture. Redemonstrated complex fixation and reconstruction surrounding the right maxillary sinus with screw and plate fixation of the right zygoma and the orbital floor. Orbits: Post surgical changes from screw and plate fixation of prior orbital floor defect. The inferior rectus muscle body on the right is slightly inferiorly displaced. Soft tissues: There are bubbly secretions within the nasal cavity, within the nasopharynx, in the oropharynx. There is rightward deviation of the nasal septum. CTA HEAD There is no evidence of active extravasation within the nasal cavity to explain patient's epistaxis. Anterior circulation: No significant stenosis, proximal occlusion, aneurysm, or vascular malformation. Posterior circulation: No  significant stenosis, proximal occlusion, aneurysm, or vascular malformation. Venous sinuses: As permitted by contrast timing, patent. Anatomic variants: None Review of the MIP images confirms the above findings. IMPRESSION: 1. No acute intracranial abnormality. 2. No evidence of active extravasation within the nasal cavity to explain patient's epistaxis. Small amount of hyperdense secretions in the anterior ethmoid air cells and left sphenoid sinus, may represent blood products. 3. No significant stenosis, proximal occlusion, aneurysm, or vascular malformation in the intracranial circulation. 4. Extensive postsurgical changes involving the paranasal sinuses, including the maxillary floor, and the right orbit with screw and plate fixation across the right orbital floor defect. Compared to the preoperative CT dated 01/09/17, the right frontal sinus appears more expansile. Although nonspecific, recurrent disease is not  excluded. Recommend tissue sampling, if possible. 5. Bubbly secretions within the nasal cavity, within the nasopharynx, in the oropharynx. Electronically Signed   By: Marin Roberts M.D.   On: 10/19/2022 15:12   CT Angio Head W or Wo Contrast  Result Date: 10/19/2022 CLINICAL DATA:  Epistaxis EXAM: CT MAXILLOFACIAL WITH CONTRAST; CT ANGIOGRAPHY HEAD TECHNIQUE: Multidetector CT imaging of the maxillofacial structures was performed with intravenous contrast. Multiplanar CT image reconstructions were also generated. Multidetector CT imaging of the head was performed using the standard protocol during bolus administration of intravenous contrast. Multiplanar CT image reconstructions and MIPs were obtained to evaluate the vascular anatomy. RADIATION DOSE REDUCTION: This exam was performed according to the departmental dose-optimization program which includes automated exposure control, adjustment of the mA and/or kV according to patient size and/or use of iterative reconstruction technique. CONTRAST:  72m OMNIPAQUE IOHEXOL 350 MG/ML SOLN COMPARISON:  CT Head 01/09/17 FINDINGS: CT HEAD Brain: No evidence of acute infarction, hemorrhage, hydrocephalus, extra-axial collection or mass lesion/mass effect. Vascular: No hyperdense vessel or unexpected calcification. Skull: Normal. Negative for fracture or focal lesion. Sinuses: No mastoid or middle ear effusion. The right maxillary sinus is essentially absent. A small portion of the alveolar ridge remains. The right frontal sinus is completely opacified and has a more expansile appearance compared to the preoperative CT dated 01/09/2017. There has also been interval bilateral inferior ethmoidectomies. There osseous findings suggestive of chronic right ethmoid and sphenoid sinusitis. There are small amount of hyperdense secretions along the anterior aspect of the ethmoid air cells and in the left sphenoid sinus. CT FACE Osseous: Chronic right lamina papyracea fracture.  Redemonstrated complex fixation and reconstruction surrounding the right maxillary sinus with screw and plate fixation of the right zygoma and the orbital floor. Orbits: Post surgical changes from screw and plate fixation of prior orbital floor defect. The inferior rectus muscle body on the right is slightly inferiorly displaced. Soft tissues: There are bubbly secretions within the nasal cavity, within the nasopharynx, in the oropharynx. There is rightward deviation of the nasal septum. CTA HEAD There is no evidence of active extravasation within the nasal cavity to explain patient's epistaxis. Anterior circulation: No significant stenosis, proximal occlusion, aneurysm, or vascular malformation. Posterior circulation: No significant stenosis, proximal occlusion, aneurysm, or vascular malformation. Venous sinuses: As permitted by contrast timing, patent. Anatomic variants: None Review of the MIP images confirms the above findings. IMPRESSION: 1. No acute intracranial abnormality. 2. No evidence of active extravasation within the nasal cavity to explain patient's epistaxis. Small amount of hyperdense secretions in the anterior ethmoid air cells and left sphenoid sinus, may represent blood products. 3. No significant stenosis, proximal occlusion, aneurysm, or vascular malformation in the intracranial circulation. 4. Extensive postsurgical changes involving the  paranasal sinuses, including the maxillary floor, and the right orbit with screw and plate fixation across the right orbital floor defect. Compared to the preoperative CT dated 01/09/17, the right frontal sinus appears more expansile. Although nonspecific, recurrent disease is not excluded. Recommend tissue sampling, if possible. 5. Bubbly secretions within the nasal cavity, within the nasopharynx, in the oropharynx. Electronically Signed   By: Marin Roberts M.D.   On: 10/19/2022 15:12    Procedures Procedures   Medications Ordered in ED Medications   oxymetazoline (AFRIN) 0.05 % nasal spray 1 spray (1 spray Each Nare Given 10/19/22 1145)  tranexamic acid (CYKLOKAPRON) 1000 MG/10ML topical solution 500 mg (500 mg Topical Given 10/19/22 1151)  lidocaine-EPINEPHrine (XYLOCAINE W/EPI) 1 %-1:100000 (with pres) injection 20 mL (20 mLs Other Given 10/19/22 1151)  iohexol (OMNIPAQUE) 350 MG/ML injection 75 mL (75 mLs Intravenous Contrast Given 10/19/22 1425)    ED Course/ Medical Decision Making/ A&P Clinical Course as of 10/19/22 1631  Thu Oct 19, 2022  1138 Verified full code with patient.  [RP]  1233 Bleeding controlled.  [RP]  4174 Dr Janace Hoard from ENT consulted.  Feels that anterior packing would be safe for the patient in either nare but may not be as effective since her nasal septum has been disrupted by surgery. [RP]    Clinical Course User Index [RP] Fransico Meadow, MD                            Medical Decision Making Amount and/or Complexity of Data Reviewed Labs: ordered. Radiology: ordered.  Risk OTC drugs. Prescription drug management.   Verdine Kamaiya Antilla is a 62 y.o. female with comorbidities that complicate the patient evaluation including R maxillary sinus cancer who presents emergency department with epistaxis  Initial Ddx:  Epistaxis, symptomatic anemia, malignancy recurrence  MDM:  Patient presents with persistent epistaxis.  Will attempt to gain control of the bleeding with Afrin and digital compression.  Will check labs since patient has had bleeding for a week to see if she was anemic to the point that she is requiring transfusion though does not appear to have significant symptoms at this time.  Also aware that the patient could have recurrence of her malignancy with her bleeding.  Plan:  Labs Afrin and digital compression CT when bleeding is controlled  ED Summary/Re-evaluation:  Bleeding was controlled with Afrin and digital compression.  Patient went for CTA as well as CT scan to evaluate for source of  bleeding or recurrence of her malignancy.  While she was in [place] did experience mild bleeding which was controlled again with digital compression and Afrin.  The scan did not show extravasation concerning for bleeding but did show maxillary sinus fullness that could potentially be recurrence of disease.  Discussed this with the patient and her daughter who will follow-up with her ENT doctor about this.  Discussed with the on-call ENT specialist who stated that if packing was required that anterior packing would be safe for the patient in either nare.  Since patient did not have recurrent bleeding after this did not require packing.  Hemoglobin did drop slightly while in the emergency department but not significantly enough to require transfusion.  Patient not having symptoms of anemia at this time and feels comfortable going home so we will discharge her with Afrin.  Did go over instructions on what to do if she experiences bleeding again and when to return to the  emergency department.  This patient presents to the ED for concern of complaints listed in HPI, this involves an extensive number of treatment options, and is a complaint that carries with it a high risk of complications and morbidity. Disposition including potential need for admission considered.   Dispo: DC Home. Return precautions discussed including, but not limited to, those listed in the AVS. Allowed pt time to ask questions which were answered fully prior to dc.  Additional history obtained from family Records reviewed Outpatient Clinic Notes The following labs were independently interpreted: CBC and show acute anemia I personally reviewed and interpreted cardiac monitoring: normal sinus rhythm  I personally reviewed and interpreted the pt's EKG: see above for interpretation  I have reviewed the patients home medications and made adjustments as needed Consults:  ENT  Final Clinical Impression(s) / ED Diagnoses Final diagnoses:   Epistaxis  History of cancer of maxillary sinus    Rx / DC Orders ED Discharge Orders     None         Fransico Meadow, MD 10/19/22 [phone]

## 2022-10-19 NOTE — Discharge Instructions (Signed)
You were seen for your nosebleeding in the emergency department.   At home, please use Afrin and hold pressure on your nose for 15 minutes continuously if you start experiencing nosebleeding again.    Follow-up with your ENT doctor (Dr. Barton Dubois) soon as possible regarding her symptoms and CT scan.  Follow-up with your primary doctor in 2-3 days regarding your visit.    Return immediately to the emergency department if you experience any of the following: Bleeding that persists past 15 minutes, difficulty breathing, or any other concerning symptoms.    Thank you for visiting our Emergency Department. It was a pleasure taking care of you today.

## 2022-10-23 ENCOUNTER — Other Ambulatory Visit: Payer: Self-pay

## 2022-10-30 ENCOUNTER — Other Ambulatory Visit: Payer: Self-pay | Admitting: Otolaryngology

## 2022-10-30 DIAGNOSIS — C760 Malignant neoplasm of head, face and neck: Secondary | ICD-10-CM

## 2022-11-01 ENCOUNTER — Other Ambulatory Visit: Payer: Self-pay

## 2022-11-02 ENCOUNTER — Other Ambulatory Visit: Payer: Self-pay | Admitting: Otolaryngology

## 2022-11-02 DIAGNOSIS — C760 Malignant neoplasm of head, face and neck: Secondary | ICD-10-CM

## 2022-11-03 ENCOUNTER — Other Ambulatory Visit: Payer: Self-pay

## 2022-11-06 ENCOUNTER — Ambulatory Visit
Admission: RE | Admit: 2022-11-06 | Discharge: 2022-11-06 | Disposition: A | Discharge status: Home / Self Care | Payer: Medicare HMO | Source: Ambulatory Visit | Attending: Otolaryngology | Admitting: Otolaryngology

## 2022-11-06 DIAGNOSIS — C760 Malignant neoplasm of head, face and neck: Secondary | ICD-10-CM

## 2022-11-06 MED ORDER — IOPAMIDOL (ISOVUE-370) INJECTION 76%
75.0000 mL | Freq: Once | INTRAVENOUS | Status: AC | PRN
  Administered 2022-11-06: 75 mL via INTRAVENOUS

## 2023-06-13 ENCOUNTER — Encounter: Payer: Self-pay | Admitting: Hematology

## 2023-09-07 ENCOUNTER — Other Ambulatory Visit: Payer: Self-pay

## 2023-09-07 ENCOUNTER — Emergency Department (HOSPITAL_COMMUNITY): Payer: Medicare HMO

## 2023-09-07 ENCOUNTER — Inpatient Hospital Stay (HOSPITAL_COMMUNITY)
Admission: EM | Admit: 2023-09-07 | Discharge: 2023-09-10 | DRG: 291 | Disposition: A | Discharge status: Home / Self Care | Payer: Medicare HMO | Attending: Internal Medicine | Admitting: Internal Medicine

## 2023-09-07 DIAGNOSIS — Z823 Family history of stroke: Secondary | ICD-10-CM

## 2023-09-07 DIAGNOSIS — T501X6A Underdosing of loop [high-ceiling] diuretics, initial encounter: Secondary | ICD-10-CM | POA: Diagnosis present

## 2023-09-07 DIAGNOSIS — D509 Iron deficiency anemia, unspecified: Secondary | ICD-10-CM | POA: Diagnosis present

## 2023-09-07 DIAGNOSIS — I509 Heart failure, unspecified: Secondary | ICD-10-CM | POA: Diagnosis not present

## 2023-09-07 DIAGNOSIS — D696 Thrombocytopenia, unspecified: Secondary | ICD-10-CM | POA: Diagnosis present

## 2023-09-07 DIAGNOSIS — D649 Anemia, unspecified: Secondary | ICD-10-CM

## 2023-09-07 DIAGNOSIS — Z90721 Acquired absence of ovaries, unilateral: Secondary | ICD-10-CM

## 2023-09-07 DIAGNOSIS — F319 Bipolar disorder, unspecified: Secondary | ICD-10-CM | POA: Diagnosis present

## 2023-09-07 DIAGNOSIS — Z880 Allergy status to penicillin: Secondary | ICD-10-CM

## 2023-09-07 DIAGNOSIS — Z833 Family history of diabetes mellitus: Secondary | ICD-10-CM

## 2023-09-07 DIAGNOSIS — R001 Bradycardia, unspecified: Secondary | ICD-10-CM | POA: Diagnosis present

## 2023-09-07 DIAGNOSIS — I5033 Acute on chronic diastolic (congestive) heart failure: Secondary | ICD-10-CM | POA: Diagnosis not present

## 2023-09-07 DIAGNOSIS — Z7984 Long term (current) use of oral hypoglycemic drugs: Secondary | ICD-10-CM

## 2023-09-07 DIAGNOSIS — F3189 Other bipolar disorder: Secondary | ICD-10-CM | POA: Diagnosis present

## 2023-09-07 DIAGNOSIS — J9621 Acute and chronic respiratory failure with hypoxia: Secondary | ICD-10-CM | POA: Diagnosis present

## 2023-09-07 DIAGNOSIS — C310 Malignant neoplasm of maxillary sinus: Secondary | ICD-10-CM | POA: Diagnosis present

## 2023-09-07 DIAGNOSIS — Z91199 Patient's noncompliance with other medical treatment and regimen due to unspecified reason: Secondary | ICD-10-CM

## 2023-09-07 DIAGNOSIS — Z87891 Personal history of nicotine dependence: Secondary | ICD-10-CM

## 2023-09-07 DIAGNOSIS — R946 Abnormal results of thyroid function studies: Secondary | ICD-10-CM | POA: Diagnosis present

## 2023-09-07 DIAGNOSIS — E785 Hyperlipidemia, unspecified: Secondary | ICD-10-CM | POA: Diagnosis present

## 2023-09-07 DIAGNOSIS — Z6841 Body Mass Index (BMI) 40.0 and over, adult: Secondary | ICD-10-CM

## 2023-09-07 DIAGNOSIS — Z9981 Dependence on supplemental oxygen: Secondary | ICD-10-CM

## 2023-09-07 DIAGNOSIS — E119 Type 2 diabetes mellitus without complications: Secondary | ICD-10-CM | POA: Diagnosis present

## 2023-09-07 DIAGNOSIS — Z79899 Other long term (current) drug therapy: Secondary | ICD-10-CM

## 2023-09-07 DIAGNOSIS — Z888 Allergy status to other drugs, medicaments and biological substances status: Secondary | ICD-10-CM

## 2023-09-07 DIAGNOSIS — I2729 Other secondary pulmonary hypertension: Secondary | ICD-10-CM | POA: Diagnosis present

## 2023-09-07 LAB — TROPONIN I (HIGH SENSITIVITY)
Troponin I (High Sensitivity): 7 ng/L (ref <18)
Troponin I (High Sensitivity): 8 ng/L (ref <18)

## 2023-09-07 LAB — CBC
HCT: 31.8 % — ABNORMAL LOW (ref 36.0–46.0)
Hemoglobin: 8.3 g/dL — ABNORMAL LOW (ref 12.0–15.0)
MCH: 22.3 pg — ABNORMAL LOW (ref 26.0–34.0)
MCHC: 26.1 g/dL — ABNORMAL LOW (ref 30.0–36.0)
MCV: 85.3 fL (ref 80.0–100.0)
Platelets: 152 10*3/uL (ref 150–400)
RBC: 3.73 MIL/uL — ABNORMAL LOW (ref 3.87–5.11)
RDW: 19.2 % — ABNORMAL HIGH (ref 11.5–15.5)
WBC: 5.9 10*3/uL (ref 4.0–10.5)
nRBC: 0.3 % — ABNORMAL HIGH (ref 0.0–0.2)

## 2023-09-07 LAB — BASIC METABOLIC PANEL
Anion gap: 7 (ref 5–15)
BUN: 23 mg/dL (ref 8–23)
CO2: 34 mmol/L — ABNORMAL HIGH (ref 22–32)
Calcium: 8.9 mg/dL (ref 8.9–10.3)
Chloride: 98 mmol/L (ref 98–111)
Creatinine, Ser: 0.84 mg/dL (ref 0.44–1.00)
GFR, Estimated: >60 mL/min (ref >60)
Glucose, Bld: 98 mg/dL (ref 70–99)
Potassium: 4.3 mmol/L (ref 3.5–5.1)
Sodium: 139 mmol/L (ref 135–145)

## 2023-09-07 LAB — BRAIN NATRIURETIC PEPTIDE: B Natriuretic Peptide: 220.8 pg/mL — ABNORMAL HIGH (ref 0.0–100.0)

## 2023-09-07 MED ORDER — ACETAMINOPHEN 325 MG PO TABS
650.0000 mg | ORAL_TABLET | Freq: Four times a day (QID) | ORAL | Status: DC | PRN
  Administered 2023-09-08 – 2023-09-10 (×5): 650 mg via ORAL
  Filled 2023-09-07 (×5): qty 2

## 2023-09-07 MED ORDER — POLYETHYLENE GLYCOL 3350 17 G PO PACK: 17.0000 g | PACK | Freq: Every day | ORAL | Status: DC | PRN

## 2023-09-07 MED ORDER — ENOXAPARIN SODIUM 80 MG/0.8ML IJ SOSY
70.0000 mg | PREFILLED_SYRINGE | INTRAMUSCULAR | Status: DC
  Filled 2023-09-07: qty 0.7

## 2023-09-07 MED ORDER — FUROSEMIDE 10 MG/ML IJ SOLN
40.0000 mg | Freq: Every day | INTRAMUSCULAR | Status: DC
Start: 2023-09-08 — End: 2023-09-08

## 2023-09-07 MED ORDER — NAPHAZOLINE-GLYCERIN 0.012-0.25 % OP SOLN
2.0000 [drp] | Freq: Four times a day (QID) | OPHTHALMIC | Status: DC
  Administered 2023-09-08 – 2023-09-09 (×5): 2 [drp] via OPHTHALMIC
  Filled 2023-09-07 (×2): qty 15

## 2023-09-07 MED ORDER — METFORMIN HCL 500 MG PO TABS
500.0000 mg | ORAL_TABLET | Freq: Every day | ORAL | Status: DC
Start: 2023-09-08 — End: 2023-09-08

## 2023-09-07 MED ORDER — ARIPIPRAZOLE 5 MG PO TABS
15.0000 mg | ORAL_TABLET | Freq: Every day | ORAL | Status: DC
  Administered 2023-09-08 – 2023-09-09 (×3): 15 mg via ORAL
  Filled 2023-09-07 (×3): qty 1

## 2023-09-07 MED ORDER — FUROSEMIDE 10 MG/ML IJ SOLN
40.0000 mg | Freq: Once | INTRAMUSCULAR | Status: AC
  Administered 2023-09-07: 40 mg via INTRAVENOUS
  Filled 2023-09-07: qty 4

## 2023-09-07 MED ORDER — SODIUM CHLORIDE 0.9% FLUSH
3.0000 mL | Freq: Two times a day (BID) | INTRAVENOUS | Status: DC
  Administered 2023-09-08: 3 mL via INTRAVENOUS
  Administered 2023-09-08: 10 mL via INTRAVENOUS
  Administered 2023-09-08 – 2023-09-09 (×2): 3 mL via INTRAVENOUS
  Administered 2023-09-09: 10 mL via INTRAVENOUS
  Administered 2023-09-10: 3 mL via INTRAVENOUS

## 2023-09-07 MED ORDER — ACETAMINOPHEN 650 MG RE SUPP: 650.0000 mg | Freq: Four times a day (QID) | RECTAL | Status: DC | PRN

## 2023-09-07 MED ORDER — FUROSEMIDE 10 MG/ML IJ SOLN: 40.0000 mg | Freq: Every day | INTRAMUSCULAR | Status: DC

## 2023-09-07 NOTE — Assessment & Plan Note (Signed)
C.w metformin

## 2023-09-07 NOTE — ED Triage Notes (Signed)
Per EMS, pt is coming from home. Reports SOB and dizziness for the past 2 days. Pt given 5mg  albuterol. Denies any chest pain, or N/V. Pt is non-compliant with meds, hx of heart failure.

## 2023-09-07 NOTE — Assessment & Plan Note (Signed)
This seems to be chronic since at least 03/22/2022. However, slightly worse tonight. Will check TSH

## 2023-09-07 NOTE — ED Provider Notes (Signed)
[place] [facility]ERGENCY DEPARTMENT AT [facility] Provider Note   CSN: [id number] Arrival date & time: 09/07/23  1322     History  Chief Complaint  Patient presents with   Shortness of Breath   Leg Swelling    Jodi [name] is a 62 y.o. female.  62 year old female presenting emergency department with shortness of breath.  Reports history of CHF.  Worsening shortness of breath over the past 4 days.  Also notes increased lower extremity edema, dyspnea on exertion.  States she can only walk several feet before she gets quite winded.  No chest pain.  She has not taken her Lasix since ambulation.   Shortness of Breath      Home Medications Prior to Admission medications   Medication Sig Start Date End Date Taking? Authorizing Provider  ARIPiprazole (ABILIFY) 15 MG tablet Take 1 tablet (15 mg total) by mouth at bedtime. 02/26/22  Yes Blue, Soijett A, PA-C  furosemide (LASIX) 20 MG tablet Take 1 tablet (20 mg total) by mouth as directed. Take 1 tablet in the morning. Can take additional dose if needed for leg swelling and shortness of breath Patient taking differently: Take 20 mg by mouth in the morning. 12/23/20 09/07/23 Yes Yates Decamp, MD  ibuprofen (ADVIL) 200 MG tablet Take 200-400 mg by mouth every 6 (six) hours as needed for mild pain or headache.   Yes [provider]  metFORMIN (GLUCOPHAGE) 500 MG tablet Take 500 mg by mouth daily with breakfast.   Yes [provider]  NON FORMULARY Place 1-2 drops into both eyes See admin instructions. Family Care redness relief eye drops- Place 1-2 drops into both eyes once a day and an additional three to four times a day as needed for redness   Yes [provider]  potassium chloride SA (KLOR-CON) 20 MEQ tablet three times daily for 2 days then po twice daily for 1 week and f/u with PCP for further management of hypokalemia Patient taking differently: Take 20 mEq by mouth daily. 06/24/19  Yes  Johney Maine, MD  divalproex (DEPAKOTE) 500 MG DR tablet Take 1 tablet (500 mg total) by mouth at bedtime. Patient not taking: Reported on 09/07/2023 02/26/22   Blue, Soijett A, PA-C  traMADol (ULTRAM) 50 MG tablet Take 1 tablet (50 mg total) by mouth every 6 (six) hours as needed (pain). Patient not taking: Reported on 09/07/2023 09/08/22   Zenia Resides, MD      Allergies    Jardiance [empagliflozin] and Penicillins    Review of Systems   Review of Systems  Respiratory:  Positive for shortness of breath.     Physical Exam Updated Vital Signs BP 112/75   Pulse (!) 51   Temp 98 F (36.7 C) (Oral)   Resp 15   Ht 5\' 4"  (1.626 m)   Wt 136.1 kg   SpO2 100%   BMI 51.49 kg/m  Physical Exam Vitals and nursing note reviewed.  Constitutional:      General: She is not in acute distress.    Appearance: She is not toxic-appearing.  HENT:     Head: Normocephalic.  Cardiovascular:     Rate and Rhythm: Normal rate and regular rhythm.  Pulmonary:     Effort: Pulmonary effort is normal.     Breath sounds: Normal breath sounds.  Musculoskeletal:     Right lower leg: Edema present.     Left lower leg: Edema present.  Skin:  General: Skin is warm and dry.     Capillary Refill: Capillary refill takes less than 2 seconds.  Neurological:     Mental Status: She is alert and oriented to person, place, and time.  Psychiatric:        Mood and Affect: Mood normal.        Behavior: Behavior normal.     ED Results / Procedures / Treatments   Labs (all labs ordered are listed, but only abnormal results are displayed) Labs Reviewed  BASIC METABOLIC PANEL - Abnormal; Notable for the following components:      Result Value   CO2 34 (*)    All other components within normal limits  CBC - Abnormal; Notable for the following components:   RBC 3.73 (*)    Hemoglobin 8.3 (*)    HCT 31.8 (*)    MCH 22.3 (*)    MCHC 26.1 (*)    RDW 19.2 (*)    nRBC 0.3 (*)    All other  components within normal limits  BRAIN NATRIURETIC PEPTIDE - Abnormal; Notable for the following components:   B Natriuretic Peptide 220.8 (*)    All other components within normal limits  HEPATIC FUNCTION PANEL  VITAMIN B12  FOLATE  IRON AND TIBC  FERRITIN  RETICULOCYTES  HAPTOGLOBIN  TSH  T4, FREE  MAGNESIUM  CBC  CREATININE, SERUM  HIV ANTIBODY (ROUTINE TESTING W REFLEX)  APTT  PROTIME-INR  BASIC METABOLIC PANEL  CBC  TROPONIN I (HIGH SENSITIVITY)  TROPONIN I (HIGH SENSITIVITY)    EKG EKG Interpretation Date/Time:  Friday September 07 2023 20:27:56 EST Ventricular Rate:  49 PR Interval:  203 QRS Duration:  101 QT Interval:  520 QTC Calculation: 470 R Axis:   68  Text Interpretation: Sinus bradycardia Ventricular premature complex Low voltage, precordial leads Confirmed by Estanislado Pandy [phone]) on 09/07/2023 8:42:20 PM  Radiology DG Chest 2 View Result Date: 09/07/2023 CLINICAL DATA:  Shortness of breath and dizziness for the past 2 days. EXAM: CHEST - 2 VIEW COMPARISON:  CT chest dated November 06, 2022. Chest x-ray dated November 08, 2018. FINDINGS: Chronic cardiomegaly. Pulmonary vascular congestion. No focal consolidation, pleural effusion, or pneumothorax. No acute osseous abnormality. IMPRESSION: 1. Cardiomegaly with pulmonary vascular congestion. Electronically Signed   By: Obie Dredge M.D.   On: 09/07/2023 16:11    Procedures Procedures    Medications Ordered in ED Medications  furosemide (LASIX) injection 40 mg (has no administration in time range)  ARIPiprazole (ABILIFY) tablet 15 mg (has no administration in time range)  naphazoline-glycerin (CLEAR EYES REDNESS) ophth solution 2 drop (has no administration in time range)  metFORMIN (GLUCOPHAGE) tablet 500 mg (has no administration in time range)  enoxaparin (LOVENOX) injection 40 mg (has no administration in time range)  acetaminophen (TYLENOL) tablet 650 mg (has no administration in time range)     Or  acetaminophen (TYLENOL) suppository 650 mg (has no administration in time range)  polyethylene glycol (MIRALAX / GLYCOLAX) packet 17 g (has no administration in time range)  sodium chloride flush (NS) 0.9 % injection 3 mL (has no administration in time range)  furosemide (LASIX) injection 40 mg (40 mg Intravenous Given 09/07/23 2253)    ED Course/ Medical Decision Making/ A&P Clinical Course as of 09/07/23 2340  Fri Sep 07, 2023  2106 B Natriuretic Peptide(!): 220.8 [TY]    Clinical Course User Index [TY] Coral Spikes, DO  Medical Decision Making Presenting emergency department for shortness of breath.  Lower extremity edema, dyspnea on exertion in setting of CHF.  Appears fluid overloaded.  Given Lasix.  Chest x-ray with vascular congestion, but over pulmonary edema.  Placed on oxygen for comfort with improvement of her subjective shortness of breath.  Initial troponin negative.  EKG without ST segment changes to indicate ischemia.  ACS unlikely.  BNP mildly elevated to 20.  Stable anemia.  Given patient's age and comorbid medical conditions with worsening CHF and social determinants of health with medication noncompliance.  Will admit for IV diuresis.  Amount and/or Complexity of Data Reviewed Independent Historian:     Details: sister notes patient can only walk several feet. Labs: ordered. Decision-making details documented in ED Course. Radiology: ordered.  Risk Decision regarding hospitalization.           Final Clinical Impression(s) / ED Diagnoses Final diagnoses:  Acute on chronic congestive heart failure, unspecified heart failure type [facility])    Rx / DC Orders ED Discharge Orders     None         Coral Spikes, DO 09/07/23 2340

## 2023-09-07 NOTE — H&P (Addendum)
History and Physical    Patient: Jodi [name] WNI:[id number] DOB: Jul 27, 1961 DOA: 09/07/2023 DOS: the patient was seen and examined on 09/07/2023 PCP: Patient, No Pcp Per  Patient coming from: Home  Chief Complaint:  Chief Complaint  Patient presents with   Shortness of Breath   Leg Swelling   HPI: Jodi [name] is a 62 y.o. female with medical history significant of "enlarged heart". And tendency to "retain fluid". Patient is supposed to be on daily lasix but tends to forget a lot of the doses. Patient has been notcing increasing leg sweling bilateral over the last 5 days. No particular aggravating, releivign facotr.  No cramping. Paitnet also reports insiduous progressive sob with exertion. Now cannot walk across the room because of sob and heave legs. Unable to function at home.  No cough/fever/chest pain/syncope.  Patient came to the ER because of the severity of her symptoms as above.  In the ER patient is not felt to be hypoxic however required oxygen for comfort.  Other vitals are nonactionable.  Medical evaluation is sought Review of Systems: As mentioned in the history of present illness. All other systems reviewed and are negative. 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   Moderate to severe pulmonary hypertension (HCC)    Morbid obesity (HCC)    Obesity hypoventilation syndrome (HCC)    On home oxygen therapy    PRN   Pickwickian syndrome (HCC)    Thoracic aortic aneurysm [facility])    Past Surgical History:  Procedure Laterality Date   RIGHT  OOPHORECTOMY     SINUS ENDO WITH FUSION N/A 02/13/2017   Procedure: ENDOSCOPIC SINUS ENDO WITH FUSION;  Surgeon: Drema Halon, MD;  Location: [facility] OR;  Service: ENT;  Laterality: N/A;   SINUSOTOMY Right 02/13/2017   Procedure: SINUSOTOMY VIA CALDWELL LUC;  Surgeon: Drema Halon, MD;  Location: [facility] OR;  Service: ENT;  Laterality: Right;   TONSILLECTOMY     Social History:  reports that she quit smoking about 19 years ago. She has never used smokeless tobacco. She reports current alcohol use. She reports that she does not use drugs.  Allergies  Allergen Reactions   Jardiance [Empagliflozin] Shortness Of Breath and Rash   Penicillins Other (See Comments)    Has patient had a PCN reaction causing immediate rash, facial/tongue/throat swelling, SOB or lightheadedness with hypotension: Unknown Has patient had a PCN reaction causing severe rash involving mucus membranes or skin necrosis: Unknown Has patient had a PCN reaction that required hospitalization: No Has patient had a PCN reaction occurring within the last 10 years: Unknown Patient states her MD informed her that she was allergic. If all of the above answers are "NO", then may proceed with Cephalo    Family History  Problem Relation Age of Onset   Cancer Father    Diabetes Other    Stroke Mother    Diabetes Mother  Prior to Admission medications   Medication Sig Start Date End Date Taking? Authorizing Provider  ARIPiprazole (ABILIFY) 15 MG tablet Take 1 tablet (15 mg total) by mouth at bedtime. 02/26/22  Yes Blue, Soijett A, PA-C  furosemide (LASIX) 20 MG tablet Take 1 tablet (20 mg total) by mouth as directed. Take 1 tablet in the morning. Can take additional dose if needed for leg swelling and shortness of breath Patient taking differently: Take 20 mg by mouth in the morning. 12/23/20 09/07/23 Yes Yates Decamp, MD  ibuprofen (ADVIL) 200 MG tablet Take 200-400 mg by mouth every 6 (six) hours as needed for mild pain or  headache.   Yes [provider]  metFORMIN (GLUCOPHAGE) 500 MG tablet Take 500 mg by mouth daily with breakfast.   Yes [provider]  NON FORMULARY Place 1-2 drops into both eyes See admin instructions. Family Care redness relief eye drops- Place 1-2 drops into both eyes once a day and an additional three to four times a day as needed for redness   Yes [provider]  potassium chloride SA (KLOR-CON) 20 MEQ tablet three times daily for 2 days then po twice daily for 1 week and f/u with PCP for further management of hypokalemia Patient taking differently: Take 20 mEq by mouth daily. 06/24/19  Yes Johney Maine, MD  divalproex (DEPAKOTE) 500 MG DR tablet Take 1 tablet (500 mg total) by mouth at bedtime. Patient not taking: Reported on 09/07/2023 02/26/22   Blue, Soijett A, PA-C  traMADol (ULTRAM) 50 MG tablet Take 1 tablet (50 mg total) by mouth every 6 (six) hours as needed (pain). Patient not taking: Reported on 09/07/2023 09/08/22   Zenia Resides, MD    Physical Exam: Vitals:   09/07/23 1400 09/07/23 1410 09/07/23 1915  BP:  (!) 119/59 112/75  Pulse:  67 (!) 51  Resp:  18 15  Temp:  98.4 F (36.9 C) 98.1 F (36.7 C)  TempSrc:  Oral Oral  SpO2:  100% 100%  Weight: 136.1 kg    Height: 5\' 4"  (1.626 m)     General: Patient is morbidly obese appears to be in no distress On 2 L/min of supplementary oxygen.  Alert awake gives a coherent account of her symptoms Respiratory exam: Bilateral intravesicular Abdomen soft nontender Cardiovascular exam S1-S2 norma Extremities: No focal pasly, 1-2+ edema up to bilateral knee of the bilateral legs.\ Sister Jodi [name] at bedside.  Data Reviewed:  Labs on Admission:  Results for orders placed or performed during the hospital encounter of 09/07/23 (from the past 24 hours)  Basic metabolic panel     Status: Abnormal   Collection Time: 09/07/23  7:45 PM  Result Value Ref Range   Sodium 139 135 - 145  mmol/L   Potassium 4.3 3.5 - 5.1 mmol/L   Chloride 98 98 - 111 mmol/L   CO2 34 (H) 22 - 32 mmol/L   Glucose, Bld 98 70 - 99 mg/dL   BUN 23 8 - 23 mg/dL   Creatinine, Ser 2.95 0.44 - 1.00 mg/dL   Calcium 8.9 8.9 - 62.1 mg/dL   GFR, Estimated >30 >86 mL/min   Anion gap 7 5 - 15  CBC     Status: Abnormal   Collection Time: 09/07/23  7:45 PM  Result Value Ref Range   WBC 5.9 4.0 - 10.5 K/uL   RBC 3.73 (L) 3.87 - 5.11 MIL/uL   Hemoglobin 8.3 (L) 12.0 - 15.0 g/dL  HCT 31.8 (L) 36.0 - 46.0 %   MCV 85.3 80.0 - 100.0 fL   MCH 22.3 (L) 26.0 - 34.0 pg   MCHC 26.1 (L) 30.0 - 36.0 g/dL   RDW 16.1 (H) 09.6 - 04.5 %   Platelets 152 150 - 400 K/uL   nRBC 0.3 (H) 0.0 - 0.2 %  Brain natriuretic peptide     Status: Abnormal   Collection Time: 09/07/23  7:45 PM  Result Value Ref Range   B Natriuretic Peptide 220.8 (H) 0.0 - 100.0 pg/mL  Troponin I (High Sensitivity)     Status: None   Collection Time: 09/07/23  7:45 PM  Result Value Ref Range   Troponin I (High Sensitivity) 7 <18 ng/L   Basic Metabolic Panel: Recent Labs  Lab 09/07/23 1945  NA 139  K 4.3  CL 98  CO2 34*  GLUCOSE 98  BUN 23  CREATININE 0.84  CALCIUM 8.9   Liver Function Tests: No results for input(s): "AST", "ALT", "ALKPHOS", "BILITOT", "PROT", "ALBUMIN" in the last 168 hours. No results for input(s): "LIPASE", "AMYLASE" in the last 168 hours. No results for input(s): "AMMONIA" in the last 168 hours. CBC: Recent Labs  Lab 09/07/23 1945  WBC 5.9  HGB 8.3*  HCT 31.8*  MCV 85.3  PLT 152   Cardiac Enzymes: Recent Labs  Lab 09/07/23 1945  TROPONINIHS 7    BNP (last 3 results) No results for input(s): "PROBNP" in the last 8760 hours. CBG: No results for input(s): "GLUCAP" in the last 168 hours.  Radiological Exams on Admission:  DG Chest 2 View Result Date: 09/07/2023 CLINICAL DATA:  Shortness of breath and dizziness for the past 2 days. EXAM: CHEST - 2 VIEW COMPARISON:  CT chest dated November 06, 2022. Chest x-ray dated November 08, 2018. FINDINGS: Chronic cardiomegaly. Pulmonary vascular congestion. No focal consolidation, pleural effusion, or pneumothorax. No acute osseous abnormality. IMPRESSION: 1. Cardiomegaly with pulmonary vascular congestion. Electronically Signed   By: Obie Dredge M.D.   On: 09/07/2023 16:11   Images independently reviewed by me I am especially concerned about a left pleural effusion.  Given that the left CP angle is not really noted EKG: Independently reviewed. Sinus brady   Assessment and Plan: * CHF (congestive heart failure) (HCC) This is a chronic documented diagnosis for the patient in the chart.  Patient unfortunately has missed a lot of doses of Lasix per patient due to forgetfulness.  Currently appears to be fluid overloaded.  We will start the patient on Lasix 40 mg IV once as ordered by the ER attending.  More doses will be given starting tomorrow.  We will check the patient's basic metabolic panel to maintain normokalemia and magnesium level.  Given that the patient's fluid overload is felt to be due to nonadherence to prescribed regimen of Lasix I think patient could go back on her home dose.  Patient's troponin is within normal limits.  Last echo in the system is from February 2021 with normal ejection fraction.  At this time I will not initiate extensive workup as to the etiology of fluid overload or CHF.  Patient is noted to be in sinus bradycardia, see below Patient is noted to have slight metabolic alkalosis.defer abg until patinet more euvolemic. Patient does not have any hypoxemia, started on oxygen for comfort. Avoid NSAIDs  Sinus bradycardia This seems to be chronic since at least 03/22/2022. However, slightly worse tonight. Will check TSH  Anemia Incidetnal. I will send wokr  up in AM  Severe bipolar affective disorder with psychosis (HCC) Phonically documented diagnosis in the chart.  Patient apparently prescribed Depakote 500 mg at  bedtime daily.  However has not taken it at least in the last 30 days.  Consider routine psych evaluation if further workup of this is indicated.  Continue with Abilify  Malignant neoplasm of maxillary sinus [facility]) Oncologic Hx:   01/2017: Initial diagnosis of SCC. 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  Path showed invasive squamous cell carcinoma, 6.1 cm, moderately differentiated, non-keratinizing, with papillary and inverted growth pattern with negative margins.   08/15/2017: elected to forgo radiotherapy despite consensus recommendation    11/21/2018: s/p for left maxillary antrostomy and anterior ethmoidectomy with biopsy. Path: Frozen specimen revealed SCCA with an inverted type appearance.    See last note from Dr. Candise Che on 06/02/2019 in system  Patient has traction of the lower eyelid on the right which patient is unable to close fully.  That she attributes to her scarring from prior treatment.  Further patient does seem to have some emmetropia of the left eye  Type 2 diabetes mellitus (HCC) C.w metformin       Advance Care Planning:   Code Status: Prior full code  Consults:  none   Family Communication: sister at bedside. All questions answred. Care plan discussed. Aptinet is eager to be discharge before her birthday and also christmas eve. I will order ambulator pulse ox for AM  Severity of Illness: The appropriate patient status for this patient is OBSERVATION. Observation status is judged to be reasonable and necessary in order to provide the required intensity of service to ensure the patient's safety. The patient's presenting symptoms, physical exam findings, and initial radiographic and laboratory data in the context of their medical condition is felt to place them at decreased risk for further clinical deterioration. Furthermore, it is  anticipated that the patient will be medically stable for discharge from the hospital within 2 midnights of admission.   Author: Nolberto Hanlon, MD 09/07/2023 10:18 PM  For on call review [email or web address].

## 2023-09-07 NOTE — Assessment & Plan Note (Addendum)
This is a chronic documented diagnosis for the patient in the chart.  Patient unfortunately has missed a lot of doses of Lasix per patient due to forgetfulness.  Currently appears to be fluid overloaded.  We will start the patient on Lasix 40 mg IV once as ordered by the ER attending.  More doses will be given starting tomorrow.  We will check the patient's basic metabolic panel to maintain normokalemia and magnesium level.  Given that the patient's fluid overload is felt to be due to nonadherence to prescribed regimen of Lasix I think patient could go back on her home dose.  Patient's troponin is within normal limits.  Last echo in the system is from February 2021 with normal ejection fraction.  At this time I will not initiate extensive workup as to the etiology of fluid overload or CHF.  Patient is noted to be in sinus bradycardia, see below Patient is noted to have slight metabolic alkalosis.defer abg until patinet more euvolemic. Patient does not have any hypoxemia, started on oxygen for comfort. Avoid NSAIDs

## 2023-09-07 NOTE — Assessment & Plan Note (Addendum)
Phonically documented diagnosis in the chart.  Patient apparently prescribed Depakote 500 mg at bedtime daily.  However has not taken it at least in the last 30 days.  Consider routine psych evaluation if further workup of this is indicated.  Continue with Abilify

## 2023-09-07 NOTE — Assessment & Plan Note (Signed)
Incidetnal. I will send wokr up in AM

## 2023-09-07 NOTE — Assessment & Plan Note (Signed)
Oncologic Hx:   01/2017: Initial diagnosis of SCC. 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  Path showed invasive squamous cell carcinoma, 6.1 cm, moderately differentiated, non-keratinizing, with papillary and inverted growth pattern with negative margins.   08/15/2017: elected to forgo radiotherapy despite consensus recommendation    11/21/2018: s/p for left maxillary antrostomy and anterior ethmoidectomy with biopsy. Path: Frozen specimen revealed SCCA with an inverted type appearance.    See last note from Dr. Candise Che on 06/02/2019 in system  Patient has traction of the lower eyelid on the right which patient is unable to close fully.  That she attributes to her scarring from prior treatment.  Further patient does seem to have some emmetropia of the left eye

## 2023-09-08 ENCOUNTER — Encounter (HOSPITAL_COMMUNITY): Payer: Self-pay | Admitting: Internal Medicine

## 2023-09-08 DIAGNOSIS — D509 Iron deficiency anemia, unspecified: Secondary | ICD-10-CM | POA: Diagnosis present

## 2023-09-08 DIAGNOSIS — E119 Type 2 diabetes mellitus without complications: Secondary | ICD-10-CM | POA: Diagnosis present

## 2023-09-08 DIAGNOSIS — I2729 Other secondary pulmonary hypertension: Secondary | ICD-10-CM | POA: Diagnosis present

## 2023-09-08 DIAGNOSIS — T501X6A Underdosing of loop [high-ceiling] diuretics, initial encounter: Secondary | ICD-10-CM | POA: Diagnosis present

## 2023-09-08 DIAGNOSIS — D696 Thrombocytopenia, unspecified: Secondary | ICD-10-CM | POA: Diagnosis present

## 2023-09-08 DIAGNOSIS — Z888 Allergy status to other drugs, medicaments and biological substances status: Secondary | ICD-10-CM | POA: Diagnosis not present

## 2023-09-08 DIAGNOSIS — Z90721 Acquired absence of ovaries, unilateral: Secondary | ICD-10-CM | POA: Diagnosis not present

## 2023-09-08 DIAGNOSIS — R946 Abnormal results of thyroid function studies: Secondary | ICD-10-CM | POA: Diagnosis present

## 2023-09-08 DIAGNOSIS — J9621 Acute and chronic respiratory failure with hypoxia: Secondary | ICD-10-CM | POA: Diagnosis present

## 2023-09-08 DIAGNOSIS — Z823 Family history of stroke: Secondary | ICD-10-CM | POA: Diagnosis not present

## 2023-09-08 DIAGNOSIS — Z79899 Other long term (current) drug therapy: Secondary | ICD-10-CM | POA: Diagnosis not present

## 2023-09-08 DIAGNOSIS — C310 Malignant neoplasm of maxillary sinus: Secondary | ICD-10-CM | POA: Diagnosis present

## 2023-09-08 DIAGNOSIS — Z880 Allergy status to penicillin: Secondary | ICD-10-CM | POA: Diagnosis not present

## 2023-09-08 DIAGNOSIS — Z833 Family history of diabetes mellitus: Secondary | ICD-10-CM | POA: Diagnosis not present

## 2023-09-08 DIAGNOSIS — E785 Hyperlipidemia, unspecified: Secondary | ICD-10-CM | POA: Diagnosis present

## 2023-09-08 DIAGNOSIS — Z7984 Long term (current) use of oral hypoglycemic drugs: Secondary | ICD-10-CM | POA: Diagnosis not present

## 2023-09-08 DIAGNOSIS — Z91199 Patient's noncompliance with other medical treatment and regimen due to unspecified reason: Secondary | ICD-10-CM | POA: Diagnosis not present

## 2023-09-08 DIAGNOSIS — R001 Bradycardia, unspecified: Secondary | ICD-10-CM | POA: Diagnosis present

## 2023-09-08 DIAGNOSIS — Z6841 Body Mass Index (BMI) 40.0 and over, adult: Secondary | ICD-10-CM | POA: Diagnosis not present

## 2023-09-08 DIAGNOSIS — F319 Bipolar disorder, unspecified: Secondary | ICD-10-CM | POA: Diagnosis present

## 2023-09-08 DIAGNOSIS — I509 Heart failure, unspecified: Secondary | ICD-10-CM | POA: Diagnosis present

## 2023-09-08 DIAGNOSIS — I5033 Acute on chronic diastolic (congestive) heart failure: Secondary | ICD-10-CM | POA: Diagnosis present

## 2023-09-08 DIAGNOSIS — Z87891 Personal history of nicotine dependence: Secondary | ICD-10-CM | POA: Diagnosis not present

## 2023-09-08 LAB — PROTIME-INR
INR: 1.1 (ref 0.8–1.2)
Prothrombin Time: 14.1 s (ref 11.4–15.2)

## 2023-09-08 LAB — BASIC METABOLIC PANEL
Anion gap: 7 (ref 5–15)
BUN: 22 mg/dL (ref 8–23)
CO2: 38 mmol/L — ABNORMAL HIGH (ref 22–32)
Calcium: 8.7 mg/dL — ABNORMAL LOW (ref 8.9–10.3)
Chloride: 97 mmol/L — ABNORMAL LOW (ref 98–111)
Creatinine, Ser: 0.79 mg/dL (ref 0.44–1.00)
GFR, Estimated: >60 mL/min (ref >60)
Glucose, Bld: 98 mg/dL (ref 70–99)
Potassium: 3.5 mmol/L (ref 3.5–5.1)
Sodium: 142 mmol/L (ref 135–145)

## 2023-09-08 LAB — CBC
HCT: 31.1 % — ABNORMAL LOW (ref 36.0–46.0)
HCT: 30.2 % — ABNORMAL LOW (ref 36.0–46.0)
Hemoglobin: 8.2 g/dL — ABNORMAL LOW (ref 12.0–15.0)
Hemoglobin: 7.9 g/dL — ABNORMAL LOW (ref 12.0–15.0)
MCH: 22.1 pg — ABNORMAL LOW (ref 26.0–34.0)
MCH: 22.3 pg — ABNORMAL LOW (ref 26.0–34.0)
MCHC: 26.4 g/dL — ABNORMAL LOW (ref 30.0–36.0)
MCHC: 26.2 g/dL — ABNORMAL LOW (ref 30.0–36.0)
MCV: 83.8 fL (ref 80.0–100.0)
MCV: 85.3 fL (ref 80.0–100.0)
Platelets: 147 10*3/uL — ABNORMAL LOW (ref 150–400)
Platelets: 136 10*3/uL — ABNORMAL LOW (ref 150–400)
RBC: 3.71 MIL/uL — ABNORMAL LOW (ref 3.87–5.11)
RBC: 3.54 MIL/uL — ABNORMAL LOW (ref 3.87–5.11)
RDW: 19 % — ABNORMAL HIGH (ref 11.5–15.5)
RDW: 19 % — ABNORMAL HIGH (ref 11.5–15.5)
WBC: 5.2 10*3/uL (ref 4.0–10.5)
WBC: 4.9 10*3/uL (ref 4.0–10.5)
nRBC: 0.6 % — ABNORMAL HIGH (ref 0.0–0.2)
nRBC: 0 % (ref 0.0–0.2)

## 2023-09-08 LAB — RETICULOCYTES
Immature Retic Fract: 25.8 % — ABNORMAL HIGH (ref 2.3–15.9)
RBC.: 3.46 MIL/uL — ABNORMAL LOW (ref 3.87–5.11)
Retic Count, Absolute: 71.3 10*3/uL (ref 19.0–186.0)
Retic Ct Pct: 2.1 % (ref 0.4–3.1)

## 2023-09-08 LAB — IRON AND TIBC
Iron: 29 ug/dL (ref 28–170)
Saturation Ratios: 7 % — ABNORMAL LOW (ref 10.4–31.8)
TIBC: 447 ug/dL (ref 250–450)
UIBC: 418 ug/dL

## 2023-09-08 LAB — HEMOGLOBIN A1C
Hgb A1c MFr Bld: 6.1 % — ABNORMAL HIGH (ref 4.8–5.6)
Mean Plasma Glucose: 128.37 mg/dL

## 2023-09-08 LAB — GLUCOSE, CAPILLARY
Glucose-Capillary: 120 mg/dL — ABNORMAL HIGH (ref 70–99)
Glucose-Capillary: 182 mg/dL — ABNORMAL HIGH (ref 70–99)

## 2023-09-08 LAB — FERRITIN: Ferritin: 3 ng/mL — ABNORMAL LOW (ref 11–307)

## 2023-09-08 LAB — TSH: TSH: 7.927 u[IU]/mL — ABNORMAL HIGH (ref 0.350–4.500)

## 2023-09-08 LAB — FOLATE: Folate: 10.4 ng/mL (ref >5.9)

## 2023-09-08 LAB — HEPATIC FUNCTION PANEL
ALT: 20 U/L (ref 0–44)
AST: 16 U/L (ref 15–41)
Albumin: 3.1 g/dL — ABNORMAL LOW (ref 3.5–5.0)
Alkaline Phosphatase: 57 U/L (ref 38–126)
Bilirubin, Direct: <0.1 mg/dL (ref 0.0–0.2)
Total Bilirubin: 0.5 mg/dL (ref <1.2)
Total Protein: 6.9 g/dL (ref 6.5–8.1)

## 2023-09-08 LAB — T4, FREE: Free T4: 0.63 ng/dL (ref 0.61–1.12)

## 2023-09-08 LAB — CREATININE, SERUM
Creatinine, Ser: 0.89 mg/dL (ref 0.44–1.00)
GFR, Estimated: >60 mL/min (ref >60)

## 2023-09-08 LAB — HIV ANTIBODY (ROUTINE TESTING W REFLEX): HIV Screen 4th Generation wRfx: NONREACTIVE

## 2023-09-08 LAB — MAGNESIUM: Magnesium: 1.8 mg/dL (ref 1.7–2.4)

## 2023-09-08 LAB — APTT: aPTT: 29 s (ref 24–36)

## 2023-09-08 LAB — VITAMIN B12: Vitamin B-12: 527 pg/mL (ref 180–914)

## 2023-09-08 LAB — CBG MONITORING, ED: Glucose-Capillary: 88 mg/dL (ref 70–99)

## 2023-09-08 MED ORDER — FUROSEMIDE 10 MG/ML IJ SOLN
40.0000 mg | Freq: Two times a day (BID) | INTRAMUSCULAR | Status: DC
Start: 2023-09-08 — End: 2023-09-10
  Administered 2023-09-08 – 2023-09-09 (×3): 40 mg via INTRAVENOUS
  Filled 2023-09-08 (×5): qty 4

## 2023-09-08 MED ORDER — INSULIN ASPART 100 UNIT/ML IJ SOLN
0.0000 [IU] | Freq: Every day | INTRAMUSCULAR | Status: DC
Start: 2023-09-08 — End: 2023-09-10
  Filled 2023-09-08: qty 0.05

## 2023-09-08 MED ORDER — FERROUS SULFATE 325 (65 FE) MG PO TABS
325.0000 mg | ORAL_TABLET | Freq: Two times a day (BID) | ORAL | Status: DC
  Administered 2023-09-08 – 2023-09-10 (×4): 325 mg via ORAL
  Filled 2023-09-08 (×4): qty 1

## 2023-09-08 MED ORDER — ENOXAPARIN SODIUM 80 MG/0.8ML IJ SOSY
70.0000 mg | PREFILLED_SYRINGE | INTRAMUSCULAR | Status: DC
  Filled 2023-09-08: qty 0.8

## 2023-09-08 MED ORDER — INSULIN ASPART 100 UNIT/ML IJ SOLN
0.0000 [IU] | Freq: Three times a day (TID) | INTRAMUSCULAR | Status: DC
  Filled 2023-09-08: qty 0.15

## 2023-09-08 MED ORDER — POTASSIUM CHLORIDE CRYS ER 20 MEQ PO TBCR
20.0000 meq | EXTENDED_RELEASE_TABLET | Freq: Two times a day (BID) | ORAL | Status: AC
  Administered 2023-09-08 (×2): 20 meq via ORAL
  Filled 2023-09-08 (×2): qty 1

## 2023-09-08 MED ORDER — MAGNESIUM SULFATE 2 GM/50ML IV SOLN
2.0000 g | Freq: Once | INTRAVENOUS | Status: AC
  Administered 2023-09-08: 2 g via INTRAVENOUS
  Filled 2023-09-08: qty 50

## 2023-09-08 NOTE — Plan of Care (Signed)

## 2023-09-08 NOTE — Progress Notes (Signed)
Patient refused Lovenox. Education was given. She still refused Lovenox; stating she do not like blood thinners. MD informed

## 2023-09-08 NOTE — Progress Notes (Signed)
PROGRESS NOTE    Jodi [name]  ZOX:[id number] DOB: Dec 16, 1960 DOA: 09/07/2023 PCP: Patient, No Pcp Per   Brief Narrative:  Jodi [name] is a 62 y.o. female with medical history significant of congestive heart failure, type 2 diabetes, maxillary sinus neoplasm, bipolar disorder, morbid obesity presented with shortness of breath since 5 days.  Patient is supposed to be on daily lasix but tends to forget a lot of the doses.   In ED patient was found to be hypoxic.  Placed on nasal cannula.  Workup concerning for fluid overload and admitted for further evaluation.  Assessment & Plan:  Acute hypoxemic respiratory failure in the setting of acute on chronic HFpEF: -Patient presented with signs of fluid overload.  Likely due to noncompliance.   -Chest x-ray shows cardiomegaly with pulmonary vascular congestion.  BNP: 220. -Continue Lasix IV twice daily.  Strict INO's and daily weight. -Keep K >4, Mg >2.  Replenish electrolytes as needed -Last echo: 10/2019 showed preserved ejection fraction, mild LVH.   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 -Followed by oncology outpatient.  Type 2 diabetes mellitus: -Hold metformin.  Start on sliding scale insulin. -A1c: 6.1.  Hypomagnesemia: Replenished.  Repeat magnesium level tomorrow a.m.  Elevated TSH: -Normal free T4.  Repeat thyroid function panel with PCP outpatient in 6 weeks.  Bipolar disorder: Continue Abilify  -Chronic anemia: -H&H 7.9.  Baseline H&H 9.  Will continue to monitor and transfuse as needed -Normal B12 and folate.  Iron panel suggesting iron deficiency.  Will start on iron supplements.  Thrombocytopenia: -Continue to monitor  Morbid obesity with BMI of 51: -Recommend diet modification,  exercise.  Sinus bradycardia: Appears to be chronic  DVT prophylaxis: Lovenox Code Status: Full code Family Communication: Patient's sister present at bedside.  Plan of care discussed with patient in length and he verbalized understanding and agreed with it. Disposition Plan: To be determined  Consultants:  None  Procedures:  None  Antimicrobials:  None  Status is: Observation    Subjective: Patient seen and examined.  Sister at the bedside.  Patient reports that she has been noncompliant with Lasix as sometimes she forgets to take it.  She continues to have shortness of breath.  Denies chest pain, palpitations, fever or chills.  Objective: Vitals:   09/08/23 1000 09/08/23 1030 09/08/23 1118 09/08/23 1200  BP: (!) 102/56 103/71  103/71  Pulse: (!) 55 (!) 49  (!) 54  Resp: 18 15  (!) 21  Temp:   98 F (36.7 C)   TempSrc:   Oral   SpO2: 99% 100%  96%  Weight:      Height:       No intake or output data in the 24 hours ending 09/08/23 1220 Filed Weights   09/07/23 1400  Weight: 136.1 kg    Examination:  General exam: Appears calm and comfortable, morbidly obese, on nasal cannula, appears sleepy but arousable.  Sister at the bedside. Respiratory system: Clear to auscultation. Respiratory effort normal. Cardiovascular system: S1 & S2 heard, RRR. No JVD, murmurs, rubs, gallops or clicks.  Bilateral 2+ pitting edema positive  gastrointestinal system: Abdomen is nondistended, soft and nontender. No organomegaly or masses felt. Normal bowel sounds heard. Central nervous system: Sleepy but arousable Extremities: Symmetric 5 x 5 power. Skin: No rashes, lesions  or ulcers   Data Reviewed: I have personally reviewed following labs and imaging studies  CBC: Recent Labs  Lab 09/07/23 1945 09/08/23 0103 09/08/23 0521  WBC 5.9 5.2 4.9  HGB 8.3* 8.2* 7.9*  HCT 31.8* 31.1* 30.2*  MCV 85.3 83.8 85.3  PLT 152 147* 136*   Basic Metabolic Panel: Recent Labs  Lab  09/07/23 1945 09/08/23 0103 09/08/23 0521  NA 139  --  142  K 4.3  --  3.5  CL 98  --  97*  CO2 34*  --  38*  GLUCOSE 98  --  98  BUN 23  --  22  CREATININE 0.84 0.89 0.79  CALCIUM 8.9  --  8.7*  MG  --   --  1.8   GFR: Estimated Creatinine Clearance: 100.5 mL/min (by C-G formula based on SCr of 0.79 mg/dL). Liver Function Tests: Recent Labs  Lab 09/08/23 0521  AST 16  ALT 20  ALKPHOS 57  BILITOT 0.5  PROT 6.9  ALBUMIN 3.1*   No results for input(s): "LIPASE", "AMYLASE" in the last 168 hours. No results for input(s): "AMMONIA" in the last 168 hours. Coagulation Profile: Recent Labs  Lab 09/08/23 0521  INR 1.1   Cardiac Enzymes: No results for input(s): "CKTOTAL", "CKMB", "CKMBINDEX", "TROPONINI" in the last 168 hours. BNP (last 3 results) No results for input(s): "PROBNP" in the last 8760 hours. HbA1C: Recent Labs    09/08/23 0824  HGBA1C 6.1*   CBG: No results for input(s): "GLUCAP" in the last 168 hours. Lipid Profile: No results for input(s): "CHOL", "HDL", "LDLCALC", "TRIG", "CHOLHDL", "LDLDIRECT" in the last 72 hours. Thyroid Function Tests: Recent Labs    09/08/23 0522  TSH 7.927*  FREET4 0.63   Anemia Panel: Recent Labs    09/08/23 0522  VITAMINB12 527  FOLATE 10.4  FERRITIN 3*  TIBC 447  IRON 29  RETICCTPCT 2.1   Sepsis Labs: No results for input(s): "PROCALCITON", "LATICACIDVEN" in the last 168 hours.  No results found for this or any previous visit (from the past 240 hours).    Radiology Studies: DG Chest 2 View Result Date: 09/07/2023 CLINICAL DATA:  Shortness of breath and dizziness for the past 2 days. EXAM: CHEST - 2 VIEW COMPARISON:  CT chest dated November 06, 2022. Chest x-ray dated November 08, 2018. FINDINGS: Chronic cardiomegaly. Pulmonary vascular congestion. No focal consolidation, pleural effusion, or pneumothorax. No acute osseous abnormality. IMPRESSION: 1. Cardiomegaly with pulmonary vascular congestion.  Electronically Signed   By: Obie Dredge M.D.   On: 09/07/2023 16:11    Scheduled Meds:  ARIPiprazole  15 mg Oral QHS   enoxaparin (LOVENOX) injection  70 mg Subcutaneous Q24H   furosemide  40 mg Intravenous Q12H   insulin aspart  0-15 Units Subcutaneous TID WC   insulin aspart  0-5 Units Subcutaneous QHS   naphazoline-glycerin  2 drop Both Eyes QID   potassium chloride  20 mEq Oral BID   sodium chloride flush  3 mL Intravenous Q12H   Continuous Infusions:   LOS: 0 days   Time spent: 35 minutes   Rajeev Escue Estill Cotta, MD [facility]  If 7PM-7AM, please contact night-coverage [email or web address] 09/08/2023, 12:20 PM

## 2023-09-08 NOTE — ED Notes (Signed)
Pt cleaned up from urinary incontinence and dressed in gown, full linen change and pt placed in gown. Purewick placed for I&O's, suction set at .

## 2023-09-08 NOTE — ED Notes (Signed)
Pt was unable to ambulated do to oxygen level drop when sitting up 80%

## 2023-09-09 ENCOUNTER — Inpatient Hospital Stay (HOSPITAL_COMMUNITY): Payer: Medicare HMO

## 2023-09-09 ENCOUNTER — Other Ambulatory Visit: Payer: Self-pay

## 2023-09-09 DIAGNOSIS — I5033 Acute on chronic diastolic (congestive) heart failure: Secondary | ICD-10-CM

## 2023-09-09 DIAGNOSIS — I509 Heart failure, unspecified: Secondary | ICD-10-CM | POA: Diagnosis not present

## 2023-09-09 LAB — CBC
HCT: 28.8 % — ABNORMAL LOW (ref 36.0–46.0)
Hemoglobin: 7.6 g/dL — ABNORMAL LOW (ref 12.0–15.0)
MCH: 22.4 pg — ABNORMAL LOW (ref 26.0–34.0)
MCHC: 26.4 g/dL — ABNORMAL LOW (ref 30.0–36.0)
MCV: 84.7 fL (ref 80.0–100.0)
Platelets: 125 10*3/uL — ABNORMAL LOW (ref 150–400)
RBC: 3.4 MIL/uL — ABNORMAL LOW (ref 3.87–5.11)
RDW: 18.9 % — ABNORMAL HIGH (ref 11.5–15.5)
WBC: 5 10*3/uL (ref 4.0–10.5)
nRBC: 0.4 % — ABNORMAL HIGH (ref 0.0–0.2)

## 2023-09-09 LAB — MAGNESIUM: Magnesium: 2.2 mg/dL (ref 1.7–2.4)

## 2023-09-09 LAB — BASIC METABOLIC PANEL
Anion gap: 7 (ref 5–15)
BUN: 22 mg/dL (ref 8–23)
CO2: 36 mmol/L — ABNORMAL HIGH (ref 22–32)
Calcium: 8.6 mg/dL — ABNORMAL LOW (ref 8.9–10.3)
Chloride: 98 mmol/L (ref 98–111)
Creatinine, Ser: 0.83 mg/dL (ref 0.44–1.00)
GFR, Estimated: >60 mL/min (ref >60)
Glucose, Bld: 116 mg/dL — ABNORMAL HIGH (ref 70–99)
Potassium: 3.6 mmol/L (ref 3.5–5.1)
Sodium: 141 mmol/L (ref 135–145)

## 2023-09-09 LAB — ECHOCARDIOGRAM COMPLETE
AR max vel: 2.19 cm2
AV Area VTI: 2.49 cm2
AV Area mean vel: 2.21 cm2
AV Mean grad: 5 mm[Hg]
AV Peak grad: 9.2 mm[Hg]
Ao pk vel: 1.52 m/s
Area-P 1/2: 4.49 cm2
Height: 64 in
MV VTI: 2.98 cm2
S' Lateral: 3.2 cm
Single Plane A4C EF: 66 %
Weight: 5516.79 [oz_av]

## 2023-09-09 LAB — GLUCOSE, CAPILLARY
Glucose-Capillary: 112 mg/dL — ABNORMAL HIGH (ref 70–99)
Glucose-Capillary: 128 mg/dL — ABNORMAL HIGH (ref 70–99)
Glucose-Capillary: 101 mg/dL — ABNORMAL HIGH (ref 70–99)
Glucose-Capillary: 145 mg/dL — ABNORMAL HIGH (ref 70–99)

## 2023-09-09 LAB — HAPTOGLOBIN: Haptoglobin: 142 mg/dL (ref 37–355)

## 2023-09-09 MED ORDER — PERFLUTREN LIPID MICROSPHERE
1.0000 mL | INTRAVENOUS | Status: AC | PRN
  Administered 2023-09-09: 3 mL via INTRAVENOUS

## 2023-09-09 NOTE — Plan of Care (Signed)

## 2023-09-09 NOTE — Progress Notes (Signed)
Mobility Specialist - Progress Note   09/09/23 1328  Mobility  Activity Transferred from bed to chair  Level of Assistance Minimal assist, patient does 75% or more  Assistive Device Front wheel walker  Range of Motion/Exercises Active Assistive  Activity Response Tolerated fair  Mobility Referral Yes  Mobility visit 1 Mobility  Mobility Specialist Start Time (ACUTE ONLY) 1310  Mobility Specialist Stop Time (ACUTE ONLY) 1328  Mobility Specialist Time Calculation (min) (ACUTE ONLY) 18 min   Pt was found in bed and agreeable to ambulate. Was min-A for bed mobility and STS. Once standing stated feeling her knees were too weak to try ambulation. Agreeable to transfer to recliner chair due to bed being wet. Pt able to stand and pivot to recliner chair. At EOS was left on recliner chair with all needs met. Call bell in reach and RN notified.  Billey Chang Mobility Specialist

## 2023-09-09 NOTE — Progress Notes (Signed)
PROGRESS NOTE    Jodi Jodi [name]  ZOX:[id number] DOB: 1960/12/17 DOA: 09/07/2023 PCP: Jodi [name], Jodi Jodi [name]   Brief Narrative:  This 62 yrs old  female with PMH significant for congestive heart failure, type 2 diabetes, maxillary sinus neoplasm, bipolar disorder, morbid obesity presented in the ED with shortness of breath for last 5 days.  Jodi [name] is supposed to be on daily lasix but tends to forget a lot of the doses.  In ED Jodi [name] was found to be hypoxic.  Placed on nasal cannula.  Workup concerning for fluid overload and admitted for further evaluation.  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  Likely due to noncompliance.   Chest x-ray showed cardiomegaly with pulmonary vascular congestion.  BNP: 220. Continue Lasix IV 40 mg  twice daily.  Strict INO's and daily weight. Keep K >4, Mg >2.  Replenish electrolytes as needed Last echo: 10/2019 showed preserved ejection fraction, mild LVH.   Repeat echo shows LVEF 65 - 70%, Mild LVH She may need sleep apnea outpatient testing.   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 Followed by oncology outpatient.   Type 2 diabetes mellitus: Hold metformin.  Started on sliding scale. Hb A1c: 6.1.   Hypomagnesemia:  Replace.  Continue to monitor   Elevated TSH: -Normal free T4.   Repeat thyroid function panel with PCP outpatient in 6 weeks.   Bipolar disorder:  Continue Abilify   Chronic anemia: H&H 7.9.  Baseline H&H 9.  Monitor H&H and transfuse as needed. Normal  B12 and folate.  Iron panel suggesting iron deficiency.  Will start on iron supplements.   Thrombocytopenia: Continue to monitor   Morbid obesity with BMI of 51: Recommend diet modification, exercise.   Sinus bradycardia:  Appears to be chronic.   DVT prophylaxis: Lovenox Code Status: Full code Family Communication: Wife at bed side Disposition Plan:    Status is: Inpatient Remains inpatient appropriate because: Admitted for fluid overload.    Consultants:  None  Procedures: Echo  Antimicrobials:  Anti-infectives (From admission, onward)    None      Subjective: Jodi [name] was seen and examined at bedside.  Overnight events noted.   Jodi [name] was lying comfortably on the bed,  states she is feeling better,  still has significant pedal swelling.  Objective: Vitals:   09/08/23 1900 09/08/23 2200 09/09/23 0112 09/09/23 0624  BP:  (!) 103/55 (!) 113/56 (!) 109/57  Pulse:  (!) 57 (!) 58 65  Resp:  16 16 16   Temp:  97.8 F (36.6 C) 97.7 F (36.5 C) 98.2 F (36.8 C)  TempSrc:  Oral Oral Oral  SpO2:  100% 100% 94%  Weight: (!) 156 kg   (!) 156.4 kg  Height:        Intake/Output Summary (Last 24 hours) at 09/09/2023 1256 Last data filed at 09/09/2023 1030 Gross [name] 24 hour  Intake 1750 ml  Output 650 ml  Net 1100 ml   Filed Weights   09/08/23 1348 09/08/23 1900 09/09/23 0624  Weight: (!) 156.7 kg (!) 156  kg (!) 156.4 kg    Examination:  General exam: Appears calm and comfortable, deconditioned, not in any acute distress. Respiratory system: CTA bilaterally. Respiratory effort normal.  RR 15 Cardiovascular system: S1 & S2 heard, RRR. Jodi JVD, murmurs, rubs, gallops or clicks.  Gastrointestinal system: Abdomen is non distended, soft and non tender.Normal bowel sounds heard. Central nervous system: Alert and oriented x 3. Jodi focal neurological deficits. Extremities: Edema+, Jodi cyanosis, Jodi clubbing Skin: Jodi rashes, lesions or ulcers Psychiatry: Judgement and  insight appear normal. Mood & affect appropriate.     Data Reviewed: I have personally reviewed following labs and imaging studies  CBC: Recent Labs  Lab 09/07/23 1945 09/08/23 0103 09/08/23 0521 09/09/23 0422  WBC 5.9 5.2 4.9 5.0  HGB 8.3* 8.2* 7.9* 7.6*  HCT 31.8* 31.1* 30.2* 28.8*  MCV 85.3 83.8 85.3 84.7  PLT 152 147* 136* 125*   Basic Metabolic Panel: Recent Labs  Lab 09/07/23 1945 09/08/23 0103 09/08/23 0521 09/09/23 0422  NA 139  --  142 141  K 4.3  --  3.5 3.6  CL 98  --  97* 98  CO2 34*  --  38* 36*  GLUCOSE 98  --  98 116*  BUN 23  --  22 22  CREATININE 0.84 0.89 0.79 0.83  CALCIUM 8.9  --  8.7* 8.6*  MG  --   --  1.8 2.2   GFR: Estimated Creatinine Clearance: 105.8 mL/min (by C-G formula based on SCr of 0.83 mg/dL). Liver Function Tests: Recent Labs  Lab 09/08/23 0521  AST 16  ALT 20  ALKPHOS 57  BILITOT 0.5  PROT 6.9  ALBUMIN 3.1*   Jodi results for input(s): "LIPASE", "AMYLASE" in the last 168 hours. Jodi results for input(s): "AMMONIA" in the last 168 hours. Coagulation Profile: Recent Labs  Lab 09/08/23 0521  INR 1.1   Cardiac Enzymes: Jodi results for input(s): "CKTOTAL", "CKMB", "CKMBINDEX", "TROPONINI" in the last 168 hours. BNP (last 3 results) Jodi results for input(s): "PROBNP" in the last 8760 hours. HbA1C: Recent Labs    09/08/23 0824  HGBA1C 6.1*   CBG: Recent Labs  Lab 09/08/23 1223 09/08/23 1731 09/08/23 2201 09/09/23 0753 09/09/23 1153  GLUCAP 88 120* 182* 112* 128*   Lipid Profile: Jodi results for input(s): "CHOL", "HDL", "LDLCALC", "TRIG", "CHOLHDL", "LDLDIRECT" in the last 72 hours. Thyroid Function Tests: Recent Labs    09/08/23 0522  TSH 7.927*  FREET4 0.63   Anemia Panel: Recent Labs    09/08/23 0522  VITAMINB12 527  FOLATE 10.4  FERRITIN 3*  TIBC 447  IRON 29  RETICCTPCT 2.1   Sepsis Labs: Jodi results for input(s): "PROCALCITON", "LATICACIDVEN" in the last 168 hours.  Jodi results found for this  or any previous visit (from the past 240 hours).   Radiology Studies: ECHOCARDIOGRAM COMPLETE Result Date: 09/09/2023    ECHOCARDIOGRAM REPORT   Jodi [name] Name:   Jodi Jodi [name] Date of Exam: 09/09/2023 Medical Rec #:  [id number]          Height:       64.0 in Accession #:    [id number]         Weight:       344.8 lb Date of Birth:  May 12, 1961         BSA:          2.466 m Jodi [name] Age:    61 years           BP:  109/57 mmHg Jodi [name] Gender: F                  HR:           64 bpm. Exam Location:  Inpatient Procedure: 2D Echo, Cardiac Doppler, Color Doppler and Intracardiac            Opacification Agent Indications:    CHF  History:        Jodi [name] has prior history of Echocardiogram examinations, most                 recent 11/18/2015. CHF, Arrythmias:Bradycardia; Risk                 Factors:Diabetes.  Sonographer:    Vern Claude Referring Phys: [id number] Kasandra Knudsen PAHWANI  Sonographer Comments: Jodi [name] is obese. Image acquisition challenging due to Jodi [name] body habitus. IMPRESSIONS  1. Left ventricular ejection fraction, by estimation, is 65 to 70%. The left ventricle has normal function. The left ventricle has Jodi regional wall motion abnormalities. There is mild left ventricular hypertrophy. Left ventricular diastolic parameters were normal.  2. Right ventricular systolic function is normal. The right ventricular size is not well visualized. There is normal pulmonary artery systolic pressure.  3. The mitral valve was not well visualized. Jodi evidence of mitral valve regurgitation.  4. The aortic valve is tricuspid. Aortic valve regurgitation is not visualized. Jodi aortic stenosis is present.  5. Aortic dilatation noted. There is mild dilatation of the ascending aorta, measuring 41 mm.  6. The inferior vena cava is normal in size with greater than 50% respiratory variability, suggesting right atrial pressure of 3 mmHg. Comparison(s): Jodi prior study available. FINDINGS  Left Ventricle: Left ventricular  ejection fraction, by estimation, is 65 to 70%. The left ventricle has normal function. The left ventricle has Jodi regional wall motion abnormalities. The left ventricular internal cavity size was normal in size. There is  mild left ventricular hypertrophy. Left ventricular diastolic parameters were normal. Right Ventricle: The right ventricular size is not well visualized. Jodi increase in right ventricular wall thickness. Right ventricular systolic function is normal. There is normal pulmonary artery systolic pressure. The tricuspid regurgitant velocity is 2.61 m/s, and with an assumed right atrial pressure of 3 mmHg, the estimated right ventricular systolic pressure is 30.2 mmHg. Left Atrium: Left atrial size was normal in size. Right Atrium: Right atrial size was normal in size. Pericardium: Trivial pericardial effusion is present. The pericardial effusion is posterior to the left ventricle. Mitral Valve: The mitral valve was not well visualized. Jodi evidence of mitral valve regurgitation. MV peak gradient, 2.7 mmHg. The mean mitral valve gradient is 1.0 mmHg. Tricuspid Valve: The tricuspid valve is normal in structure. Tricuspid valve regurgitation is trivial. Jodi evidence of tricuspid stenosis. Aortic Valve: The aortic valve is tricuspid. Aortic valve regurgitation is not visualized. Jodi aortic stenosis is present. Aortic valve mean gradient measures 5.0 mmHg. Aortic valve peak gradient measures 9.2 mmHg. Aortic valve area, by VTI measures 2.49 cm. Pulmonic Valve: The pulmonic valve was normal in structure. Pulmonic valve regurgitation is mild. Jodi evidence of pulmonic stenosis. Aorta: Aortic dilatation noted. There is mild dilatation of the ascending aorta, measuring 41 mm. Venous: The inferior vena cava is normal in size with greater than 50% respiratory variability, suggesting right atrial pressure of 3 mmHg. IAS/Shunts: The interatrial septum was not well visualized.  LEFT VENTRICLE PLAX 2D LVIDd:         4.50  cm  Diastology LVIDs:         3.20 cm      LV e' medial:    13.10 cm/s LV PW:         1.20 cm      LV E/e' medial:  4.6 LV IVS:        1.00 cm      LV e' lateral:   9.36 cm/s LVOT diam:     2.00 cm      LV E/e' lateral: 6.5 LV SV:         83 LV SV Index:   34 LVOT Area:     3.14 cm  LV Volumes (MOD) LV vol d, MOD A4C: 149.0 ml LV vol s, MOD A4C: 50.7 ml LV SV MOD A4C:     149.0 ml RIGHT VENTRICLE             IVC RV S prime:     12.30 cm/s  IVC diam: 1.60 cm TAPSE (M-mode): 2.2 cm LEFT ATRIUM             Index        RIGHT ATRIUM           Index LA diam:        2.90 cm 1.18 cm/m   RA Area:     22.60 cm LA Vol (A2C):   53.2 ml 21.57 ml/m  RA Volume:   67.60 ml  27.41 ml/m LA Vol (A4C):   74.4 ml 30.17 ml/m LA Biplane Vol: 63.5 ml 25.75 ml/m  AORTIC VALVE                     PULMONIC VALVE AV Area (Vmax):    2.19 cm      PV Vmax:       1.33 m/s AV Area (Vmean):   2.21 cm      PV Peak grad:  7.0 mmHg AV Area (VTI):     2.49 cm AV Vmax:           152.00 cm/s AV Vmean:          102.000 cm/s AV VTI:            0.333 m AV Peak Grad:      9.2 mmHg AV Mean Grad:      5.0 mmHg LVOT Vmax:         106.00 cm/s LVOT Vmean:        71.800 cm/s LVOT VTI:          0.264 m LVOT/AV VTI ratio: 0.79  AORTA Ao Root diam: 3.10 cm Ao Asc diam:  3.85 cm MITRAL VALVE               TRICUSPID VALVE MV Area (PHT): 4.49 cm    TR Peak grad:   27.2 mmHg MV Area VTI:   2.98 cm    TR Vmax:        261.00 cm/s MV Peak grad:  2.7 mmHg MV Mean grad:  1.0 mmHg    SHUNTS MV Vmax:       0.82 m/s    Systemic VTI:  0.26 m MV Vmean:      43.3 cm/s   Systemic Diam: 2.00 cm MV Decel Time: 169 msec MV E velocity: 60.80 cm/s MV A velocity: 71.10 cm/s MV E/A ratio:  0.86 Riley Lam MD Electronically signed by Riley Lam MD Signature Date/Time: 09/09/2023/12:22:51 PM    Final    DG  Chest 2 View Result Date: 09/07/2023 CLINICAL DATA:  Shortness of breath and dizziness for the past 2 days. EXAM: CHEST - 2 VIEW COMPARISON:  CT  chest dated November 06, 2022. Chest x-ray dated November 08, 2018. FINDINGS: Chronic cardiomegaly. Pulmonary vascular congestion. Jodi focal consolidation, pleural effusion, or pneumothorax. Jodi acute osseous abnormality. IMPRESSION: 1. Cardiomegaly with pulmonary vascular congestion. Electronically Signed   By: Obie Dredge M.D.   On: 09/07/2023 16:11   Scheduled Meds:  ARIPiprazole  15 mg Oral QHS   enoxaparin (LOVENOX) injection  70 mg Subcutaneous Q24H   ferrous sulfate  325 mg Oral BID WC   furosemide  40 mg Intravenous Q12H   insulin aspart  0-15 Units Subcutaneous TID WC   insulin aspart  0-5 Units Subcutaneous QHS   naphazoline-glycerin  2 drop Both Eyes QID   sodium chloride flush  3 mL Intravenous Q12H   Continuous Infusions:   LOS: 1 day    Time spent: 50 mins    Willeen Niece, MD [facility]   If 7PM-7AM, please contact night-coverage

## 2023-09-09 NOTE — Plan of Care (Signed)
  Problem: Fluid Volume: Goal: Ability to maintain a balanced intake and output will improve Outcome: Progressing   Problem: Metabolic: Goal: Ability to maintain appropriate glucose levels will improve Outcome: Progressing   Problem: Nutritional: Goal: Maintenance of adequate nutrition will improve Outcome: Progressing   Problem: Elimination: Goal: Will not experience complications related to urinary retention Outcome: Progressing

## 2023-09-10 DIAGNOSIS — I509 Heart failure, unspecified: Secondary | ICD-10-CM | POA: Diagnosis not present

## 2023-09-10 LAB — GLUCOSE, CAPILLARY
Glucose-Capillary: 110 mg/dL — ABNORMAL HIGH (ref 70–99)
Glucose-Capillary: 132 mg/dL — ABNORMAL HIGH (ref 70–99)

## 2023-09-10 LAB — CBC
HCT: 30.2 % — ABNORMAL LOW (ref 36.0–46.0)
Hemoglobin: 8.4 g/dL — ABNORMAL LOW (ref 12.0–15.0)
MCH: 22.4 pg — ABNORMAL LOW (ref 26.0–34.0)
MCHC: 27.8 g/dL — ABNORMAL LOW (ref 30.0–36.0)
MCV: 80.5 fL (ref 80.0–100.0)
Platelets: 119 10*3/uL — ABNORMAL LOW (ref 150–400)
RBC: 3.75 MIL/uL — ABNORMAL LOW (ref 3.87–5.11)
RDW: 19.1 % — ABNORMAL HIGH (ref 11.5–15.5)
WBC: 5.1 10*3/uL (ref 4.0–10.5)
nRBC: 0.6 % — ABNORMAL HIGH (ref 0.0–0.2)

## 2023-09-10 LAB — BASIC METABOLIC PANEL
Anion gap: 7 (ref 5–15)
BUN: 26 mg/dL — ABNORMAL HIGH (ref 8–23)
CO2: 37 mmol/L — ABNORMAL HIGH (ref 22–32)
Calcium: 8.5 mg/dL — ABNORMAL LOW (ref 8.9–10.3)
Chloride: 94 mmol/L — ABNORMAL LOW (ref 98–111)
Creatinine, Ser: 0.75 mg/dL (ref 0.44–1.00)
GFR, Estimated: >60 mL/min (ref >60)
Glucose, Bld: 110 mg/dL — ABNORMAL HIGH (ref 70–99)
Potassium: 4.2 mmol/L (ref 3.5–5.1)
Sodium: 138 mmol/L (ref 135–145)

## 2023-09-10 MED ORDER — TETRAHYDROZOLINE HCL 0.05 % OP SOLN
2.0000 [drp] | Freq: Two times a day (BID) | OPHTHALMIC | Status: DC
  Administered 2023-09-10: 2 [drp] via OPHTHALMIC
  Filled 2023-09-10: qty 15

## 2023-09-10 MED ORDER — SALINE SPRAY 0.65 % NA SOLN
1.0000 | NASAL | Status: DC | PRN
  Administered 2023-09-10: 1 via NASAL
  Filled 2023-09-10: qty 44

## 2023-09-10 MED ORDER — FERROUS SULFATE 325 (65 FE) MG PO TABS: 325.0000 mg | ORAL_TABLET | Freq: Two times a day (BID) | ORAL | 0 refills | Status: AC

## 2023-09-10 MED ORDER — POTASSIUM CHLORIDE CRYS ER 20 MEQ PO TBCR: 20.0000 meq | EXTENDED_RELEASE_TABLET | Freq: Every day | ORAL | 0 refills | Status: AC

## 2023-09-10 MED ORDER — FUROSEMIDE 40 MG PO TABS: 40.0000 mg | ORAL_TABLET | Freq: Every day | ORAL | 0 refills | Status: DC

## 2023-09-10 MED ORDER — NON FORMULARY: 2.0000 [drp] | Freq: Two times a day (BID) | Status: DC

## 2023-09-10 NOTE — Progress Notes (Signed)
AVS reviewed w/ pt & family in room - both verbalized an understanding- PIV removed as noted - pt dressing for d/c to home. Pt's ride enroute.

## 2023-09-10 NOTE — Discharge Summary (Signed)
Physician Discharge Summary  Jodi [name] WGN:[id number] DOB: 06-19-1961 DOA: 09/07/2023  PCP: Patient, No Pcp Per  Admit date: 09/07/2023 Discharge date: 09/10/2023  Admitted From: Home Disposition:  Home  Discharge Condition:Stable CODE STATUS:FULL Diet recommendation: Heart Healthy   Brief/Interim Summary: Patient is a 62 year old female with history of HFpEF, diabetes type 2, bipolar disorder, morbid obesity who presented to the Emergency Department with complaint of shortness of breath.  She usually takes Lasix at home but is noncompliant and forgot to take.  On prednisone she was found to be hypoxic and put on 2 L of oxygen per minute.  Looked volume overloaded on examination.  Started on IV Lasix.  Overall euvolemic today, but he still has trace bilateral lower extremity edema.  Patient is adamant about going home today and will not stop.  Today is her birthday.  Discharging home with oral Lasix.  Following problems were addressed during the hospitalization: Acute hypoxic respiratory failure: Secondary to volume overload.  Put on 2 L on admission. Lungs are clear on examination.  No crackles or wheezing.  Weaned to room air   Acute on chronic HFpEF: Presented with signs of volume overload.  Likely from noncompliance.  Chest x-ray showed cardiomegaly, pulm vascular congestion.  Elevated BNP.  Started on IV Lasix.  Coronary showed EF of 60 to 65%, left ventricular hypertrophy.  Lasix 40 mg daily at home.   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  She follows with oncology as an outpatient   Morbid obesity: BMI of 59.  Probably has underdiagnosed sleep apnea.  Needs to undergo sleep study as an outpatient.   Diabetes type 2: Takes metformin at home. A1c of 6.1   Elevated TSH: normal free T4.  Repeat TFT in 6 to 8 weeks   Bipolar  disorder: Continue Abilify   Chronic normocytic anemia: Currently hemoglobin in the range of 8.  Baseline hemoglobin in the range of 9.  No report of hematochezia or melena.  Normal vitamin B12, folic acid.  Iron panel showed iron deficiency.  Started on oral iron supplementation   Thrombocytopenia: Chronic, continue to monitor   Sinus bradycardia : chronic.  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)   Anemia   Sinus bradycardia    Discharge Instructions  Discharge Instructions     Diet - low sodium heart healthy   Complete by: As directed    Discharge instructions   Complete by: As directed    1)Please take prescribed medication as instructed 2)Follow up with your PCP in a week 3)Watch your fluid and salt intake at home.Monitor your weight   Increase activity slowly   Complete by: As directed       Allergies as of 09/10/2023       Reactions   Jardiance [empagliflozin] Shortness Of Breath, Rash   Penicillins Other (See Comments)   Has patient had a PCN reaction causing immediate rash, facial/tongue/throat swelling, SOB or lightheadedness with hypotension: Unknown Has patient had a PCN reaction causing severe rash involving mucus membranes or skin necrosis: Unknown Has patient had a PCN reaction that required hospitalization: No Has patient had a PCN reaction occurring within the last 10  years: Unknown Patient states her MD informed her that she was allergic. If all of the above answers are "NO", then may proceed with Cephalo        Medication List     STOP taking these medications    divalproex 500 MG DR tablet Commonly known as: Depakote   traMADol 50 MG tablet Commonly known as: ULTRAM       TAKE these medications    ARIPiprazole 15 MG  tablet Commonly known as: ABILIFY Take 1 tablet (15 mg total) by mouth at bedtime.   ferrous sulfate 325 (65 FE) MG tablet Take 1 tablet (325 mg total) by mouth 2 (two) times daily with a meal.   furosemide 40 MG tablet Commonly known as: Lasix Take 1 tablet (40 mg total) by mouth daily. What changed:  medication strength how much to take when to take this additional instructions   ibuprofen 200 MG tablet Commonly known as: ADVIL Take 200-400 mg by mouth every 6 (six) hours as needed for mild pain or headache.   metFORMIN 500 MG tablet Commonly known as: GLUCOPHAGE Take 500 mg by mouth daily with breakfast.   NON FORMULARY Place 1-2 drops into both eyes See admin instructions. Family Care redness relief eye drops- Place 1-2 drops into both eyes once a day and an additional three to four times a day as needed for redness   potassium chloride SA 20 MEQ tablet Commonly known as: KLOR-CON M Take 1 tablet (20 mEq total) by mouth daily.        Allergies  Allergen Reactions   Jardiance [Empagliflozin] Shortness Of Breath and Rash   Penicillins Other (See Comments)    Has patient had a PCN reaction causing immediate rash, facial/tongue/throat swelling, SOB or lightheadedness with hypotension: Unknown Has patient had a PCN reaction causing severe rash involving mucus membranes or skin necrosis: Unknown Has patient had a PCN reaction that required hospitalization: No Has patient had a PCN reaction occurring within the last 10 years: Unknown Patient states her MD informed her that she was allergic. If all of the above answers are "NO", then may proceed with Cephalo    Consultations: None   Procedures/Studies: ECHOCARDIOGRAM COMPLETE Result Date: 09/09/2023    ECHOCARDIOGRAM REPORT   Patient Name:   Farrell Ours Date of Exam: 09/09/2023 Medical Rec #:  [id number]          Height:       64.0 in Accession #:    [id number]         Weight:       344.8 lb Date of Birth:   10-22-1960         BSA:          2.466 m Patient Age:    61 years           BP:           109/57 mmHg Patient Gender: F                  HR:           64 bpm. Exam Location:  Inpatient Procedure: 2D Echo, Cardiac Doppler, Color Doppler and Intracardiac            Opacification Agent Indications:    CHF  History:        Patient has prior history of Echocardiogram examinations, most                 recent 11/18/2015.  CHF, Arrythmias:Bradycardia; Risk                 Factors:Diabetes.  Sonographer:    Vern Claude Referring Phys: [id number] Kasandra Knudsen PAHWANI  Sonographer Comments: Patient is obese. Image acquisition challenging due to patient body habitus. IMPRESSIONS  1. Left ventricular ejection fraction, by estimation, is 65 to 70%. The left ventricle has normal function. The left ventricle has no regional wall motion abnormalities. There is mild left ventricular hypertrophy. Left ventricular diastolic parameters were normal.  2. Right ventricular systolic function is normal. The right ventricular size is not well visualized. There is normal pulmonary artery systolic pressure.  3. The mitral valve was not well visualized. No evidence of mitral valve regurgitation.  4. The aortic valve is tricuspid. Aortic valve regurgitation is not visualized. No aortic stenosis is present.  5. Aortic dilatation noted. There is mild dilatation of the ascending aorta, measuring 41 mm.  6. The inferior vena cava is normal in size with greater than 50% respiratory variability, suggesting right atrial pressure of 3 mmHg. Comparison(s): No prior study available. FINDINGS  Left Ventricle: Left ventricular ejection fraction, by estimation, is 65 to 70%. The left ventricle has normal function. The left ventricle has no regional wall motion abnormalities. The left ventricular internal cavity size was normal in size. There is  mild left ventricular hypertrophy. Left ventricular diastolic parameters were normal. Right Ventricle: The right  ventricular size is not well visualized. No increase in right ventricular wall thickness. Right ventricular systolic function is normal. There is normal pulmonary artery systolic pressure. The tricuspid regurgitant velocity is 2.61 m/s, and with an assumed right atrial pressure of 3 mmHg, the estimated right ventricular systolic pressure is 30.2 mmHg. Left Atrium: Left atrial size was normal in size. Right Atrium: Right atrial size was normal in size. Pericardium: Trivial pericardial effusion is present. The pericardial effusion is posterior to the left ventricle. Mitral Valve: The mitral valve was not well visualized. No evidence of mitral valve regurgitation. MV peak gradient, 2.7 mmHg. The mean mitral valve gradient is 1.0 mmHg. Tricuspid Valve: The tricuspid valve is normal in structure. Tricuspid valve regurgitation is trivial. No evidence of tricuspid stenosis. Aortic Valve: The aortic valve is tricuspid. Aortic valve regurgitation is not visualized. No aortic stenosis is present. Aortic valve mean gradient measures 5.0 mmHg. Aortic valve peak gradient measures 9.2 mmHg. Aortic valve area, by VTI measures 2.49 cm. Pulmonic Valve: The pulmonic valve was normal in structure. Pulmonic valve regurgitation is mild. No evidence of pulmonic stenosis. Aorta: Aortic dilatation noted. There is mild dilatation of the ascending aorta, measuring 41 mm. Venous: The inferior vena cava is normal in size with greater than 50% respiratory variability, suggesting right atrial pressure of 3 mmHg. IAS/Shunts: The interatrial septum was not well visualized.  LEFT VENTRICLE PLAX 2D LVIDd:         4.50 cm      Diastology LVIDs:         3.20 cm      LV e' medial:    13.10 cm/s LV PW:         1.20 cm      LV E/e' medial:  4.6 LV IVS:        1.00 cm      LV e' lateral:   9.36 cm/s LVOT diam:     2.00 cm      LV E/e' lateral: 6.5 LV SV:         83 LV  SV Index:   34 LVOT Area:     3.14 cm  LV Volumes (MOD) LV vol d, MOD A4C: 149.0 ml LV  vol s, MOD A4C: 50.7 ml LV SV MOD A4C:     149.0 ml RIGHT VENTRICLE             IVC RV S prime:     12.30 cm/s  IVC diam: 1.60 cm TAPSE (M-mode): 2.2 cm LEFT ATRIUM             Index        RIGHT ATRIUM           Index LA diam:        2.90 cm 1.18 cm/m   RA Area:     22.60 cm LA Vol (A2C):   53.2 ml 21.57 ml/m  RA Volume:   67.60 ml  27.41 ml/m LA Vol (A4C):   74.4 ml 30.17 ml/m LA Biplane Vol: 63.5 ml 25.75 ml/m  AORTIC VALVE                     PULMONIC VALVE AV Area (Vmax):    2.19 cm      PV Vmax:       1.33 m/s AV Area (Vmean):   2.21 cm      PV Peak grad:  7.0 mmHg AV Area (VTI):     2.49 cm AV Vmax:           152.00 cm/s AV Vmean:          102.000 cm/s AV VTI:            0.333 m AV Peak Grad:      9.2 mmHg AV Mean Grad:      5.0 mmHg LVOT Vmax:         106.00 cm/s LVOT Vmean:        71.800 cm/s LVOT VTI:          0.264 m LVOT/AV VTI ratio: 0.79  AORTA Ao Root diam: 3.10 cm Ao Asc diam:  3.85 cm MITRAL VALVE               TRICUSPID VALVE MV Area (PHT): 4.49 cm    TR Peak grad:   27.2 mmHg MV Area VTI:   2.98 cm    TR Vmax:        261.00 cm/s MV Peak grad:  2.7 mmHg MV Mean grad:  1.0 mmHg    SHUNTS MV Vmax:       0.82 m/s    Systemic VTI:  0.26 m MV Vmean:      43.3 cm/s   Systemic Diam: 2.00 cm MV Decel Time: 169 msec MV E velocity: 60.80 cm/s MV A velocity: 71.10 cm/s MV E/A ratio:  0.86 Riley Lam MD Electronically signed by Riley Lam MD Signature Date/Time: 09/09/2023/12:22:51 PM    Final    DG Chest 2 View Result Date: 09/07/2023 CLINICAL DATA:  Shortness of breath and dizziness for the past 2 days. EXAM: CHEST - 2 VIEW COMPARISON:  CT chest dated November 06, 2022. Chest x-ray dated November 08, 2018. FINDINGS: Chronic cardiomegaly. Pulmonary vascular congestion. No focal consolidation, pleural effusion, or pneumothorax. No acute osseous abnormality. IMPRESSION: 1. Cardiomegaly with pulmonary vascular congestion. Electronically Signed   By: Obie Dredge M.D.   On:  09/07/2023 16:11      Subjective: Patient seen and examined at bedside today.  Hemodynamically stable.  Sister was at bedside.  She says that today is her birthday and she is not staying in the hospital anymore.  On examination, she has mild bilateral lower extremity edema but overall appears euvolemic.  Lungs are almost clear on auscultation.  Does not complain of any shortness of breath or cough.  Discharge Exam: Vitals:   09/10/23 0639 09/10/23 1331  BP: (!) 102/56 108/65  Pulse: (!) 53 (!) 52  Resp:  20  Temp:  98.1 F (36.7 C)  SpO2:  100%   Vitals:   09/10/23 0500 09/10/23 0509 09/10/23 0639 09/10/23 1331  BP:  (!) 91/53 (!) 102/56 108/65  Pulse:  (!) 51 (!) 53 (!) 52  Resp:  17  20  Temp:  98.1 F (36.7 C)  98.1 F (36.7 C)  TempSrc:  Oral  Oral  SpO2:  100%  100%  Weight: (!) 157.9 kg     Height:        General: Pt is alert, awake, not in acute distress, morbidly obese Cardiovascular: RRR, S1/S2 +, no rubs, no gallops Respiratory: CTA bilaterally, no wheezing, no rhonchi Abdominal: Soft, NT, ND, bowel sounds + Extremities: Trace bilateral lower extremity edema, no cyanosis    The results of significant diagnostics from this hospitalization (including imaging, microbiology, ancillary and laboratory) are listed below for reference.     Microbiology: No results found for this or any previous visit (from the past 240 hours).   Labs: BNP (last 3 results) Recent Labs    09/07/23 1945  BNP 220.8*   Basic Metabolic Panel: Recent Labs  Lab 09/07/23 1945 09/08/23 0103 09/08/23 0521 09/09/23 0422 09/10/23 1225  NA 139  --  142 141 138  K 4.3  --  3.5 3.6 4.2  CL 98  --  97* 98 94*  CO2 34*  --  38* 36* 37*  GLUCOSE 98  --  98 116* 110*  BUN 23  --  22 22 26*  CREATININE 0.84 0.89 0.79 0.83 0.75  CALCIUM 8.9  --  8.7* 8.6* 8.5*  MG  --   --  1.8 2.2  --    Liver Function Tests: Recent Labs  Lab 09/08/23 0521  AST 16  ALT 20  ALKPHOS 57   BILITOT 0.5  PROT 6.9  ALBUMIN 3.1*   No results for input(s): "LIPASE", "AMYLASE" in the last 168 hours. No results for input(s): "AMMONIA" in the last 168 hours. CBC: Recent Labs  Lab 09/07/23 1945 09/08/23 0103 09/08/23 0521 09/09/23 0422 09/10/23 1225  WBC 5.9 5.2 4.9 5.0 5.1  HGB 8.3* 8.2* 7.9* 7.6* 8.4*  HCT 31.8* 31.1* 30.2* 28.8* 30.2*  MCV 85.3 83.8 85.3 84.7 80.5  PLT 152 147* 136* 125* 119*   Cardiac Enzymes: No results for input(s): "CKTOTAL", "CKMB", "CKMBINDEX", "TROPONINI" in the last 168 hours. BNP: Invalid input(s): "POCBNP" CBG: Recent Labs  Lab 09/09/23 1153 09/09/23 1654 09/09/23 2050 09/10/23 0804 09/10/23 1119  GLUCAP 128* 101* 145* 110* 132*   D-Dimer No results for input(s): "DDIMER" in the last 72 hours. Hgb A1c Recent Labs    09/08/23 0824  HGBA1C 6.1*   Lipid Profile No results for input(s): "CHOL", "HDL", "LDLCALC", "TRIG", "CHOLHDL", "LDLDIRECT" in the last 72 hours. Thyroid function studies Recent Labs    09/08/23 0522  TSH 7.927*   Anemia work up Recent Labs    09/08/23 0522  VITAMINB12 527  FOLATE 10.4  FERRITIN 3*  TIBC 447  IRON 29  RETICCTPCT 2.1   Urinalysis  Component Value Date/Time   COLORURINE YELLOW 03/19/2022 1920   APPEARANCEUR CLEAR 03/19/2022 1920   APPEARANCEUR Clear 01/14/2014 1331   LABSPEC 1.011 03/19/2022 1920   LABSPEC 1.028 01/14/2014 1331   PHURINE 7.0 03/19/2022 1920   GLUCOSEU NEGATIVE 03/19/2022 1920   GLUCOSEU Negative 01/14/2014 1331   HGBUR NEGATIVE 03/19/2022 1920   BILIRUBINUR NEGATIVE 03/19/2022 1920   BILIRUBINUR Negative 01/14/2014 1331   KETONESUR NEGATIVE 03/19/2022 1920   PROTEINUR NEGATIVE 03/19/2022 1920   NITRITE NEGATIVE 03/19/2022 1920   LEUKOCYTESUR NEGATIVE 03/19/2022 1920   LEUKOCYTESUR Negative 01/14/2014 1331   Sepsis Labs Recent Labs  Lab 09/08/23 0103 09/08/23 0521 09/09/23 0422 09/10/23 1225  WBC 5.2 4.9 5.0 5.1   Microbiology No results found  for this or any previous visit (from the past 240 hours).  Please note: You were cared for by a hospitalist during your hospital stay. Once you are discharged, your primary care physician will handle any further medical issues. Please note that NO REFILLS for any discharge medications will be authorized once you are discharged, as it is imperative that you return to your primary care physician (or establish a relationship with a primary care physician if you do not have one) for your post hospital discharge needs so that they can reassess your need for medications and monitor your lab values.    Time coordinating discharge: 40 minutes  SIGNED:   Burnadette Pop, MD  [facility] 09/10/2023, 1:59 PM Pager [phone]  If 7PM-7AM, please contact night-coverage [email or web address] Password [email or web address]

## 2023-09-10 NOTE — Plan of Care (Signed)
  Problem: Education: Goal: Ability to describe self-care measures that may prevent or decrease complications (Diabetes Survival Skills Education) will improve Outcome: Progressing Goal: Individualized Educational Video(s) Outcome: Progressing   Problem: Education: Goal: Individualized Educational Video(s) Outcome: Progressing   Problem: Coping: Goal: Ability to adjust to condition or change in health will improve Outcome: Progressing   Problem: Fluid Volume: Goal: Ability to maintain a balanced intake and output will improve Outcome: Progressing   Problem: Health Behavior/Discharge Planning: Goal: Ability to identify and utilize available resources and services will improve Outcome: Progressing Goal: Ability to manage health-related needs will improve Outcome: Progressing

## 2023-09-10 NOTE — Plan of Care (Signed)
  Problem: Education: Goal: Ability to describe self-care measures that may prevent or decrease complications (Diabetes Survival Skills Education) will improve Outcome: Completed/Met Goal: Individualized Educational Video(s) Outcome: Completed/Met   Problem: Coping: Goal: Ability to adjust to condition or change in health will improve Outcome: Completed/Met   Problem: Fluid Volume: Goal: Ability to maintain a balanced intake and output will improve Outcome: Completed/Met   Problem: Health Behavior/Discharge Planning: Goal: Ability to identify and utilize available resources and services will improve Outcome: Completed/Met Goal: Ability to manage health-related needs will improve Outcome: Completed/Met   Problem: Metabolic: Goal: Ability to maintain appropriate glucose levels will improve Outcome: Completed/Met   Problem: Nutritional: Goal: Maintenance of adequate nutrition will improve Outcome: Completed/Met Goal: Progress toward achieving an optimal weight will improve Outcome: Completed/Met   Problem: Skin Integrity: Goal: Risk for impaired skin integrity will decrease Outcome: Completed/Met   Problem: Tissue Perfusion: Goal: Adequacy of tissue perfusion will improve Outcome: Completed/Met   Problem: Education: Goal: Knowledge of General Education information will improve Description: Including pain rating scale, medication(s)/side effects and non-pharmacologic comfort measures Outcome: Completed/Met   Problem: Health Behavior/Discharge Planning: Goal: Ability to manage health-related needs will improve Outcome: Completed/Met   Problem: Clinical Measurements: Goal: Ability to maintain clinical measurements within normal limits will improve Outcome: Completed/Met Goal: Will remain free from infection Outcome: Completed/Met Goal: Diagnostic test results will improve Outcome: Completed/Met Goal: Respiratory complications will improve Outcome: Completed/Met Goal:  Cardiovascular complication will be avoided Outcome: Completed/Met   Problem: Activity: Goal: Risk for activity intolerance will decrease Outcome: Completed/Met   Problem: Nutrition: Goal: Adequate nutrition will be maintained Outcome: Completed/Met   Problem: Coping: Goal: Level of anxiety will decrease Outcome: Completed/Met   Problem: Elimination: Goal: Will not experience complications related to bowel motility Outcome: Completed/Met Goal: Will not experience complications related to urinary retention Outcome: Completed/Met   Problem: Pain Management: Goal: General experience of comfort will improve Outcome: Completed/Met   Problem: Safety: Goal: Ability to remain free from injury will improve Outcome: Completed/Met   Problem: Skin Integrity: Goal: Risk for impaired skin integrity will decrease Outcome: Completed/Met

## 2023-10-18 ENCOUNTER — Encounter: Payer: Self-pay | Admitting: Hematology

## 2023-10-18 ENCOUNTER — Other Ambulatory Visit: Payer: Self-pay

## 2023-10-18 ENCOUNTER — Emergency Department (HOSPITAL_COMMUNITY): Payer: Medicare HMO

## 2023-10-18 ENCOUNTER — Encounter (HOSPITAL_COMMUNITY): Payer: Self-pay

## 2023-10-18 ENCOUNTER — Inpatient Hospital Stay (HOSPITAL_COMMUNITY)
Admission: EM | Admit: 2023-10-18 | Discharge: 2023-10-20 | DRG: 193 | Disposition: A | Discharge status: Home Health | Payer: Medicare HMO | Attending: Internal Medicine | Admitting: Internal Medicine

## 2023-10-18 DIAGNOSIS — Z90721 Acquired absence of ovaries, unilateral: Secondary | ICD-10-CM

## 2023-10-18 DIAGNOSIS — G8929 Other chronic pain: Secondary | ICD-10-CM | POA: Diagnosis present

## 2023-10-18 DIAGNOSIS — Z888 Allergy status to other drugs, medicaments and biological substances status: Secondary | ICD-10-CM

## 2023-10-18 DIAGNOSIS — F319 Bipolar disorder, unspecified: Secondary | ICD-10-CM | POA: Diagnosis present

## 2023-10-18 DIAGNOSIS — E876 Hypokalemia: Secondary | ICD-10-CM | POA: Diagnosis present

## 2023-10-18 DIAGNOSIS — I5033 Acute on chronic diastolic (congestive) heart failure: Principal | ICD-10-CM | POA: Diagnosis present

## 2023-10-18 DIAGNOSIS — E662 Morbid (severe) obesity with alveolar hypoventilation: Secondary | ICD-10-CM | POA: Diagnosis present

## 2023-10-18 DIAGNOSIS — Z833 Family history of diabetes mellitus: Secondary | ICD-10-CM

## 2023-10-18 DIAGNOSIS — R0602 Shortness of breath: Secondary | ICD-10-CM | POA: Diagnosis not present

## 2023-10-18 DIAGNOSIS — I2729 Other secondary pulmonary hypertension: Secondary | ICD-10-CM | POA: Diagnosis present

## 2023-10-18 DIAGNOSIS — J101 Influenza due to other identified influenza virus with other respiratory manifestations: Principal | ICD-10-CM | POA: Diagnosis present

## 2023-10-18 DIAGNOSIS — Z823 Family history of stroke: Secondary | ICD-10-CM

## 2023-10-18 DIAGNOSIS — E119 Type 2 diabetes mellitus without complications: Secondary | ICD-10-CM | POA: Diagnosis present

## 2023-10-18 DIAGNOSIS — Z6841 Body Mass Index (BMI) 40.0 and over, adult: Secondary | ICD-10-CM

## 2023-10-18 DIAGNOSIS — I509 Heart failure, unspecified: Secondary | ICD-10-CM

## 2023-10-18 DIAGNOSIS — Z8522 Personal history of malignant neoplasm of nasal cavities, middle ear, and accessory sinuses: Secondary | ICD-10-CM

## 2023-10-18 DIAGNOSIS — D638 Anemia in other chronic diseases classified elsewhere: Secondary | ICD-10-CM | POA: Diagnosis present

## 2023-10-18 DIAGNOSIS — Z79899 Other long term (current) drug therapy: Secondary | ICD-10-CM

## 2023-10-18 DIAGNOSIS — Z7984 Long term (current) use of oral hypoglycemic drugs: Secondary | ICD-10-CM

## 2023-10-18 DIAGNOSIS — E785 Hyperlipidemia, unspecified: Secondary | ICD-10-CM | POA: Diagnosis present

## 2023-10-18 DIAGNOSIS — Z8673 Personal history of transient ischemic attack (TIA), and cerebral infarction without residual deficits: Secondary | ICD-10-CM

## 2023-10-18 DIAGNOSIS — Z880 Allergy status to penicillin: Secondary | ICD-10-CM

## 2023-10-18 DIAGNOSIS — R0600 Dyspnea, unspecified: Principal | ICD-10-CM

## 2023-10-18 DIAGNOSIS — Z8589 Personal history of malignant neoplasm of other organs and systems: Secondary | ICD-10-CM

## 2023-10-18 DIAGNOSIS — Z1152 Encounter for screening for COVID-19: Secondary | ICD-10-CM

## 2023-10-18 DIAGNOSIS — T501X5A Adverse effect of loop [high-ceiling] diuretics, initial encounter: Secondary | ICD-10-CM | POA: Diagnosis present

## 2023-10-18 DIAGNOSIS — I2781 Cor pulmonale (chronic): Secondary | ICD-10-CM | POA: Diagnosis present

## 2023-10-18 DIAGNOSIS — I5031 Acute diastolic (congestive) heart failure: Secondary | ICD-10-CM | POA: Diagnosis not present

## 2023-10-18 DIAGNOSIS — M549 Dorsalgia, unspecified: Secondary | ICD-10-CM | POA: Diagnosis present

## 2023-10-18 DIAGNOSIS — E871 Hypo-osmolality and hyponatremia: Secondary | ICD-10-CM | POA: Diagnosis not present

## 2023-10-18 DIAGNOSIS — Z87891 Personal history of nicotine dependence: Secondary | ICD-10-CM

## 2023-10-18 LAB — RESP PANEL BY RT-PCR (RSV, FLU A&B, COVID)  RVPGX2
Influenza A by PCR: POSITIVE — AB
Influenza B by PCR: NEGATIVE
Resp Syncytial Virus by PCR: NEGATIVE
SARS Coronavirus 2 by RT PCR: NEGATIVE

## 2023-10-18 LAB — CBC WITH DIFFERENTIAL/PLATELET
Abs Immature Granulocytes: 0.13 10*3/uL — ABNORMAL HIGH (ref 0.00–0.07)
Basophils Absolute: 0 10*3/uL (ref 0.0–0.1)
Basophils Relative: 1 %
Eosinophils Absolute: 0.1 10*3/uL (ref 0.0–0.5)
Eosinophils Relative: 1 %
HCT: 32.3 % — ABNORMAL LOW (ref 36.0–46.0)
Hemoglobin: 8.3 g/dL — ABNORMAL LOW (ref 12.0–15.0)
Immature Granulocytes: 2 %
Lymphocytes Relative: 11 %
Lymphs Abs: 0.7 10*3/uL (ref 0.7–4.0)
MCH: 21.6 pg — ABNORMAL LOW (ref 26.0–34.0)
MCHC: 25.7 g/dL — ABNORMAL LOW (ref 30.0–36.0)
MCV: 84.1 fL (ref 80.0–100.0)
Monocytes Absolute: 0.6 10*3/uL (ref 0.1–1.0)
Monocytes Relative: 9 %
Neutro Abs: 5 10*3/uL (ref 1.7–7.7)
Neutrophils Relative %: 76 %
Platelets: 279 10*3/uL (ref 150–400)
RBC: 3.84 MIL/uL — ABNORMAL LOW (ref 3.87–5.11)
RDW: 20.5 % — ABNORMAL HIGH (ref 11.5–15.5)
WBC: 6.5 10*3/uL (ref 4.0–10.5)
nRBC: 1.4 % — ABNORMAL HIGH (ref 0.0–0.2)

## 2023-10-18 LAB — COMPREHENSIVE METABOLIC PANEL
ALT: 15 U/L (ref 0–44)
AST: 18 U/L (ref 15–41)
Albumin: 3.2 g/dL — ABNORMAL LOW (ref 3.5–5.0)
Alkaline Phosphatase: 52 U/L (ref 38–126)
Anion gap: 8 (ref 5–15)
BUN: 30 mg/dL — ABNORMAL HIGH (ref 8–23)
CO2: 33 mmol/L — ABNORMAL HIGH (ref 22–32)
Calcium: 8.4 mg/dL — ABNORMAL LOW (ref 8.9–10.3)
Chloride: 94 mmol/L — ABNORMAL LOW (ref 98–111)
Creatinine, Ser: 1.15 mg/dL — ABNORMAL HIGH (ref 0.44–1.00)
GFR, Estimated: 54 mL/min — ABNORMAL LOW (ref >60)
Glucose, Bld: 97 mg/dL (ref 70–99)
Potassium: 3 mmol/L — ABNORMAL LOW (ref 3.5–5.1)
Sodium: 135 mmol/L (ref 135–145)
Total Bilirubin: 0.7 mg/dL (ref 0.0–1.2)
Total Protein: 7.2 g/dL (ref 6.5–8.1)

## 2023-10-18 LAB — CBG MONITORING, ED
Glucose-Capillary: 104 mg/dL — ABNORMAL HIGH (ref 70–99)
Glucose-Capillary: 89 mg/dL (ref 70–99)

## 2023-10-18 LAB — TROPONIN I (HIGH SENSITIVITY): Troponin I (High Sensitivity): 11 ng/L (ref <18)

## 2023-10-18 LAB — I-STAT CG4 LACTIC ACID, ED: Lactic Acid, Venous: 1 mmol/L (ref 0.5–1.9)

## 2023-10-18 LAB — BRAIN NATRIURETIC PEPTIDE: B Natriuretic Peptide: 639.3 pg/mL — ABNORMAL HIGH (ref 0.0–100.0)

## 2023-10-18 MED ORDER — SODIUM CHLORIDE 0.9 % IV SOLN: INTRAVENOUS | Status: DC

## 2023-10-18 MED ORDER — ONDANSETRON HCL 4 MG PO TABS: 4.0000 mg | ORAL_TABLET | Freq: Four times a day (QID) | ORAL | Status: DC | PRN

## 2023-10-18 MED ORDER — INSULIN ASPART 100 UNIT/ML IJ SOLN
0.0000 [IU] | Freq: Every day | INTRAMUSCULAR | Status: DC
  Filled 2023-10-18: qty 0.05

## 2023-10-18 MED ORDER — ALBUTEROL SULFATE (2.5 MG/3ML) 0.083% IN NEBU: 2.5000 mg | INHALATION_SOLUTION | RESPIRATORY_TRACT | Status: DC | PRN

## 2023-10-18 MED ORDER — POTASSIUM CHLORIDE CRYS ER 20 MEQ PO TBCR
40.0000 meq | EXTENDED_RELEASE_TABLET | Freq: Once | ORAL | Status: AC
  Administered 2023-10-18: 40 meq via ORAL
  Filled 2023-10-18: qty 2

## 2023-10-18 MED ORDER — ARIPIPRAZOLE 5 MG PO TABS
15.0000 mg | ORAL_TABLET | Freq: Every day | ORAL | Status: DC
  Administered 2023-10-18 – 2023-10-19 (×2): 15 mg via ORAL
  Filled 2023-10-18 (×2): qty 1
  Filled 2023-10-18: qty 3

## 2023-10-18 MED ORDER — FUROSEMIDE 10 MG/ML IJ SOLN
60.0000 mg | Freq: Once | INTRAMUSCULAR | Status: AC
  Administered 2023-10-18: 60 mg via INTRAVENOUS
  Filled 2023-10-18: qty 8

## 2023-10-18 MED ORDER — ENOXAPARIN SODIUM 40 MG/0.4ML IJ SOSY
40.0000 mg | PREFILLED_SYRINGE | INTRAMUSCULAR | Status: DC
  Filled 2023-10-18 (×2): qty 0.4

## 2023-10-18 MED ORDER — ACETAMINOPHEN 650 MG RE SUPP: 650.0000 mg | Freq: Four times a day (QID) | RECTAL | Status: DC | PRN

## 2023-10-18 MED ORDER — TRAZODONE HCL 50 MG PO TABS
25.0000 mg | ORAL_TABLET | Freq: Every evening | ORAL | Status: DC | PRN
Start: 2023-10-18 — End: 2023-10-20
  Filled 2023-10-18: qty 1

## 2023-10-18 MED ORDER — FERROUS SULFATE 325 (65 FE) MG PO TABS
325.0000 mg | ORAL_TABLET | Freq: Two times a day (BID) | ORAL | Status: DC
  Administered 2023-10-18 – 2023-10-20 (×4): 325 mg via ORAL
  Filled 2023-10-18 (×4): qty 1

## 2023-10-18 MED ORDER — BENZONATATE 100 MG PO CAPS
100.0000 mg | ORAL_CAPSULE | Freq: Three times a day (TID) | ORAL | Status: DC | PRN
  Administered 2023-10-19: 100 mg via ORAL
  Filled 2023-10-18 (×2): qty 1

## 2023-10-18 MED ORDER — FUROSEMIDE 10 MG/ML IJ SOLN
60.0000 mg | Freq: Two times a day (BID) | INTRAMUSCULAR | Status: DC
  Administered 2023-10-18: 60 mg via INTRAVENOUS
  Filled 2023-10-18: qty 8

## 2023-10-18 MED ORDER — ACETAMINOPHEN 325 MG PO TABS
650.0000 mg | ORAL_TABLET | Freq: Four times a day (QID) | ORAL | Status: DC | PRN
  Administered 2023-10-19 – 2023-10-20 (×3): 650 mg via ORAL
  Filled 2023-10-18 (×3): qty 2

## 2023-10-18 MED ORDER — ONDANSETRON HCL 4 MG/2ML IJ SOLN: 4.0000 mg | Freq: Four times a day (QID) | INTRAMUSCULAR | Status: DC | PRN

## 2023-10-18 MED ORDER — INSULIN ASPART 100 UNIT/ML IJ SOLN
0.0000 [IU] | Freq: Three times a day (TID) | INTRAMUSCULAR | Status: DC
  Administered 2023-10-19: 2 [IU] via SUBCUTANEOUS
  Filled 2023-10-18: qty 0.15

## 2023-10-18 NOTE — Progress Notes (Signed)
VAST consult received to obtain new IV access at patient's request. Upon arrival at bedside, patient reported the IV no longer hurts. I informed her that was probably because she didn't have anything infusing at the moment. Assessed and flushed her PIV with NS without difficulty or complaints from patient. Asked patient if she would like for me to place another IV in a better location and she declined. ER RN notified.

## 2023-10-18 NOTE — ED Notes (Signed)
Requested from EDP/admitting MD perwick or cath due to limited mobility and lasix ordered,

## 2023-10-18 NOTE — ED Notes (Signed)
Periwick in place to promote pt moisture reduction with urination

## 2023-10-18 NOTE — ED Notes (Signed)
PT states IV is painful running in Normal saline,  no signs of infiltration but discontinued fluids until IV consult team consults

## 2023-10-18 NOTE — ED Notes (Addendum)
Pt experienced spontaneous nose bleed while administering po iron,  I removed her from nasal cannnula and placed her on face mask,  maintaining ox sats of mid 90at 4L, ( pt desatted to 81 off ox)

## 2023-10-18 NOTE — H&P (Signed)
History and Physical  Jodi [name] MVH:[id number] DOB: October 03, 1960 DOA: 10/18/2023  PCP: Patient, No Pcp Per   Chief Complaint: Cough, shortness of breath  HPI: Jodi [name] is a 63 y.o. female with medical history significant for non-insulin-dependent type 2 diabetes, pulmonary hypertension, heart failure with preserved EF, morbid obesity being admitted to the hospital with acute exacerbation of heart failure.  She was recently admitted to the hospital in late December 2024 for acute heart failure, at that time felt to be due to medication noncompliance.  Patient today tells me that she has "mostly" been taking her Lasix at home, but feels like she has not been urinating very much.  She has a dry wet sounding cough, and feels short of breath with exertion.  Denies any chest pain, fevers, nausea, vomiting or other concerns.  Her sister has been a little bit sick, today in the ER she also tested positive for influenza A.  Review of Systems: Please see HPI for pertinent positives and negatives. A complete 10 system review of systems are otherwise negative.  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   Moderate to severe pulmonary hypertension (HCC)    Morbid obesity (HCC)    Obesity hypoventilation syndrome (HCC)    On home oxygen therapy    PRN   Pickwickian syndrome (HCC)    Thoracic aortic aneurysm [facility])    Past Surgical History:  Procedure Laterality Date   RIGHT OOPHORECTOMY     SINUS ENDO WITH FUSION N/A 02/13/2017   Procedure: ENDOSCOPIC SINUS ENDO WITH FUSION;   Surgeon: Drema Halon, MD;  Location: [facility] OR;  Service: ENT;  Laterality: N/A;   SINUSOTOMY Right 02/13/2017   Procedure: SINUSOTOMY VIA CALDWELL LUC;  Surgeon: Drema Halon, MD;  Location: [facility] OR;  Service: ENT;  Laterality: Right;   TONSILLECTOMY     Social History:  reports that she quit smoking about 20 years ago. Her smoking use included cigarettes. She has never used smokeless tobacco. She reports that she does not drink alcohol and does not use drugs.  Allergies  Allergen Reactions   Jardiance [Empagliflozin] Shortness Of Breath and Rash   Penicillins Other (See Comments)    Has patient had a PCN reaction causing immediate rash, facial/tongue/throat swelling, SOB or lightheadedness with hypotension: Unknown Has patient had a PCN reaction causing severe rash involving mucus membranes or skin necrosis: Unknown Has patient had a PCN reaction that required hospitalization: No Has patient had a PCN reaction occurring within the last 10 years: Unknown Patient states her MD informed her that she was allergic. If all of the above answers are "NO", then may proceed with Cephalo    Family History  Problem Relation Age of Onset   Cancer Father    Diabetes Other    Stroke Mother    Diabetes Mother      Prior to Admission medications   Medication Sig Start Date End  Date Taking? Authorizing Provider  ARIPiprazole (ABILIFY) 15 MG tablet Take 1 tablet (15 mg total) by mouth at bedtime. 02/26/22   Blue, Soijett A, PA-C  ferrous sulfate 325 (65 FE) MG tablet Take 1 tablet (325 mg total) by mouth 2 (two) times daily with a meal. 09/10/23   Burnadette Pop, MD  furosemide (LASIX) 40 MG tablet Take 1 tablet (40 mg total) by mouth daily. 09/10/23 09/09/24  Burnadette Pop, MD  ibuprofen (ADVIL) 200 MG tablet Take 200-400 mg by mouth every 6 (six) hours as needed for mild pain or headache.    [provider]  metFORMIN (GLUCOPHAGE) 500 MG tablet Take 500 mg by mouth daily with  breakfast.    [provider]  NON FORMULARY Place 1-2 drops into both eyes See admin instructions. [facility] redness relief eye drops- Place 1-2 drops into both eyes once a day and an additional three to four times a day as needed for redness    [provider]  potassium chloride SA (KLOR-CON M) 20 MEQ tablet Take 1 tablet (20 mEq total) by mouth daily. 09/10/23   Burnadette Pop, MD    Physical Exam: BP 131/62 (BP Location: Left Arm)   Pulse (!) 57   Temp 98 F (36.7 C)   Resp 15  General:  Alert, oriented, calm, in no acute distress, resting comfortably on 2 L nasal cannula oxygen saturating 100%. Cardiovascular: RRR, no murmurs or rubs, she has ruddy 3+ pitting bilateral lower extremity edema Respiratory: Breath sounds are diminished at the bilateral bases, she has some diffuse rhonchi, no wheezing, no tachypnea, no significant cough Abdomen: soft, nontender, morbidly obese Skin: dry, no rashes  Musculoskeletal: no joint effusions, normal range of motion  Psychiatric: appropriate affect, normal speech  Neurologic: extraocular muscles intact, clear speech, moving all extremities with intact sensorium         Labs on Admission:  Basic Metabolic Panel: Recent Labs  Lab 10/18/23 1029  NA 135  K 3.0*  CL 94*  CO2 33*  GLUCOSE 97  BUN 30*  CREATININE 1.15*  CALCIUM 8.4*   Liver Function Tests: Recent Labs  Lab 10/18/23 1029  AST 18  ALT 15  ALKPHOS 52  BILITOT 0.7  PROT 7.2  ALBUMIN 3.2*   No results for input(s): "LIPASE", "AMYLASE" in the last 168 hours. No results for input(s): "AMMONIA" in the last 168 hours. CBC: Recent Labs  Lab 10/18/23 1029  WBC 6.5  NEUTROABS 5.0  HGB 8.3*  HCT 32.3*  MCV 84.1  PLT 279   Cardiac Enzymes: No results for input(s): "CKTOTAL", "CKMB", "CKMBINDEX", "TROPONINI" in the last 168 hours. BNP (last 3 results) Recent Labs    09/07/23 1945 10/18/23 1100  BNP 220.8* 639.3*    ProBNP (last 3  results) No results for input(s): "PROBNP" in the last 8760 hours.  CBG: No results for input(s): "GLUCAP" in the last 168 hours.  Radiological Exams on Admission: DG Chest Port 1 View Result Date: 10/18/2023 CLINICAL DATA:  Shortness of breath EXAM: PORTABLE CHEST 1 VIEW COMPARISON:  09/07/2023 FINDINGS: Study limited by body habitus. Hazy densities project over the mid and lower lungs felt to represent soft tissue attenuation. Low lung volumes. Cardiomegaly, vascular congestion. No definite confluent opacities, effusions or edema. IMPRESSION: Limited study by body habitus. Cardiomegaly, vascular congestion. Electronically Signed   By: Charlett Nose M.D.   On: 10/18/2023 11:16   Assessment/Plan Jodi [name] is a 63 y.o. female with medical history  significant for non-insulin-dependent type 2 diabetes, pulmonary hypertension, heart failure with preserved EF, morbid obesity being admitted to the hospital with acute exacerbation of heart failure.   Acute on chronic heart failure with preserved EF-I suspect is likely due to recurrent noncompliance, as the patient states she has mostly been taking her Lasix.  She has worsening orthopnea, dyspnea with exertion, lower extremity edema, and elevated BNP greater than 600. -Observation admission -Telemetry monitoring -Heart healthy diet -IV Lasix 60 mg twice daily  Hypokalemia-likely due to recent diuretic use at home, however spotty.  Given 40 mill equivalents oral potassium in the ER. -Recheck potassium levels with morning labs, in the setting of diuresis  Mild renal insufficiency-in the setting of normal renal function.  Possibly due to fluid overload, monitor with daily labs expect improvement with diuresis.  Chronic anemia-stable at baseline  Non-insulin-dependent type 2 diabetes-well-controlled, last hemoglobin A1c 6.1 -Hold home metformin -Heart healthy carb modified diet -Moderate dose sliding scale  DVT prophylaxis: Lovenox      Code Status: Full Code  Consults called: None  Admission status: Observation  Time spent: 49 minutes  Zivah Mayr Sharlette Dense MD [facility] Pager [phone]  If 7PM-7AM, please contact night-coverage [email or web address] Password [email or web address]  10/18/2023, 1:02 PM

## 2023-10-18 NOTE — ED Triage Notes (Signed)
Pt BIBA from home for shortness of breath, endorses recent sick contact.  Presents with dry hacking cough and increasing difficulty breathing x 4 days.  Endorses increasing weakness and dizziness  Pt denies productive cough, nausea and vomiting.    Pt was 88% on room air, place on 4L [place] with rebound to 95%    Hx of CHF

## 2023-10-18 NOTE — ED Provider Notes (Signed)
[place] EMERGENCY DEPARTMENT AT [facility] Provider Note   CSN: [id number] Arrival date & time: 10/18/23  1005     History  Chief Complaint  Patient presents with   Shortness of Breath    Jodi [name] is a 63 y.o. female.  63 year old female with prior stroke CHF presents with increased shortness of breath times several days.  Patient endorses orthopnea as well as due to exertion.  Denies any chest pain or chest pressure.  No fever or chills.  Has had dry nonproductive cough.  Patient is not on oxygen normally.  EMS was called and patient pulse ox was 80% on room air.  Placed on 4 L nasal cannula and improved to 95%.  Transported here for further management       Home Medications Prior to Admission medications   Medication Sig Start Date End Date Taking? Authorizing Provider  ARIPiprazole (ABILIFY) 15 MG tablet Take 1 tablet (15 mg total) by mouth at bedtime. 02/26/22   Blue, Soijett A, PA-C  ferrous sulfate 325 (65 FE) MG tablet Take 1 tablet (325 mg total) by mouth 2 (two) times daily with a meal. 09/10/23   Burnadette Pop, MD  furosemide (LASIX) 40 MG tablet Take 1 tablet (40 mg total) by mouth daily. 09/10/23 09/09/24  Burnadette Pop, MD  ibuprofen (ADVIL) 200 MG tablet Take 200-400 mg by mouth every 6 (six) hours as needed for mild pain or headache.    [provider]  metFORMIN (GLUCOPHAGE) 500 MG tablet Take 500 mg by mouth daily with breakfast.    [provider]  NON FORMULARY Place 1-2 drops into both eyes See admin instructions. Family Care redness relief eye drops- Place 1-2 drops into both eyes once a day and an additional three to four times a day as needed for redness    [provider]  potassium chloride SA (KLOR-CON M) 20 MEQ tablet Take 1 tablet (20 mEq total) by mouth daily. 09/10/23   Burnadette Pop, MD      Allergies    Jardiance [empagliflozin] and Penicillins    Review of Systems   Review of Systems  All  other systems reviewed and are negative.   Physical Exam Updated Vital Signs BP 131/62 (BP Location: Left Arm)   Pulse (!) 57   Temp 98 F (36.7 C)   Resp 15  Physical Exam Vitals and nursing note reviewed.  Constitutional:      General: She is not in acute distress.    Appearance: Normal appearance. She is well-developed. She is not toxic-appearing.  HENT:     Head: Normocephalic and atraumatic.  Eyes:     General: Lids are normal.     Conjunctiva/sclera: Conjunctivae normal.     Pupils: Pupils are equal, round, and reactive to light.  Neck:     Thyroid: No thyroid mass.     Trachea: No tracheal deviation.  Cardiovascular:     Rate and Rhythm: Normal rate and regular rhythm.     Heart sounds: Normal heart sounds. No murmur heard.    No gallop.  Pulmonary:     Effort: Respiratory distress present.     Breath sounds: Decreased air movement present. Examination of the right-lower field reveals rhonchi. Examination of the left-lower field reveals rhonchi. Rhonchi present. No decreased breath sounds, wheezing or rales.  Abdominal:     General: There is no distension.     Palpations: Abdomen is soft.     Tenderness: There is  no abdominal tenderness. There is no rebound.  Musculoskeletal:        General: No tenderness. Normal range of motion.     Cervical back: Normal range of motion and neck supple.  Lymphadenopathy:     Comments: 3 plus edema  Skin:    General: Skin is warm and dry.     Findings: No abrasion or rash.  Neurological:     Mental Status: She is alert and oriented to person, place, and time. Mental status is at baseline.     GCS: GCS eye subscore is 4. GCS verbal subscore is 5. GCS motor subscore is 6.     Cranial Nerves: No cranial nerve deficit.     Sensory: No sensory deficit.     Motor: Motor function is intact.  Psychiatric:        Attention and Perception: Attention normal.        Speech: Speech normal.        Behavior: Behavior normal.     ED  Results / Procedures / Treatments   Labs (all labs ordered are listed, but only abnormal results are displayed) Labs Reviewed  RESP PANEL BY RT-PCR (RSV, FLU A&B, COVID)  [id number]  BRAIN NATRIURETIC PEPTIDE  CBC WITH DIFFERENTIAL/PLATELET  COMPREHENSIVE METABOLIC PANEL  TROPONIN I (HIGH SENSITIVITY)    EKG None  Radiology No results found.  Procedures Procedures    Medications Ordered in ED Medications  0.9 %  sodium chloride infusion (has no administration in time range)    ED Course/ Medical Decision Making/ A&P                                 Medical Decision Making Amount and/or Complexity of Data Reviewed Labs: ordered. Radiology: ordered.  Risk Prescription drug management.   Patient's chest x-ray consistent with pulmonary vascular congestion and CHF.  She has elevated BNP.  Given Lasix 60 mg IV push.  She is also influenza A positive.  Mild hypokalemia noted potassium of 3.0.  Will give oral potassium.  Troponin within normal limits.  Low suspicion for ACS.  Low suspicion for PE.  No signs of pneumonia at this time.  Will admit to hospitalist team  CRITICAL CARE Performed by: Toy Baker Total critical care time: 50 minutes Critical care time was exclusive of separately billable procedures and treating other patients. Critical care was necessary to treat or prevent imminent or life-threatening deterioration. Critical care was time spent personally by me on the following activities: development of treatment plan with patient and/or surrogate as well as nursing, discussions with consultants, evaluation of patient's response to treatment, examination of patient, obtaining history from patient or surrogate, ordering and performing treatments and interventions, ordering and review of laboratory studies, ordering and review of radiographic studies, pulse oximetry and re-evaluation of patient's condition.         Final Clinical Impression(s) / ED  Diagnoses Final diagnoses:  None    Rx / DC Orders ED Discharge Orders     None         Lorre Nick, MD 10/18/23 1224

## 2023-10-19 DIAGNOSIS — M549 Dorsalgia, unspecified: Secondary | ICD-10-CM | POA: Diagnosis present

## 2023-10-19 DIAGNOSIS — I5033 Acute on chronic diastolic (congestive) heart failure: Secondary | ICD-10-CM | POA: Diagnosis present

## 2023-10-19 DIAGNOSIS — Z90721 Acquired absence of ovaries, unilateral: Secondary | ICD-10-CM | POA: Diagnosis not present

## 2023-10-19 DIAGNOSIS — E119 Type 2 diabetes mellitus without complications: Secondary | ICD-10-CM | POA: Diagnosis present

## 2023-10-19 DIAGNOSIS — Z7984 Long term (current) use of oral hypoglycemic drugs: Secondary | ICD-10-CM | POA: Diagnosis not present

## 2023-10-19 DIAGNOSIS — R0602 Shortness of breath: Secondary | ICD-10-CM | POA: Diagnosis present

## 2023-10-19 DIAGNOSIS — F319 Bipolar disorder, unspecified: Secondary | ICD-10-CM | POA: Diagnosis present

## 2023-10-19 DIAGNOSIS — G8929 Other chronic pain: Secondary | ICD-10-CM | POA: Diagnosis present

## 2023-10-19 DIAGNOSIS — I2781 Cor pulmonale (chronic): Secondary | ICD-10-CM | POA: Diagnosis present

## 2023-10-19 DIAGNOSIS — Z1152 Encounter for screening for COVID-19: Secondary | ICD-10-CM | POA: Diagnosis not present

## 2023-10-19 DIAGNOSIS — I5031 Acute diastolic (congestive) heart failure: Secondary | ICD-10-CM

## 2023-10-19 DIAGNOSIS — Z8673 Personal history of transient ischemic attack (TIA), and cerebral infarction without residual deficits: Secondary | ICD-10-CM | POA: Diagnosis not present

## 2023-10-19 DIAGNOSIS — Z87891 Personal history of nicotine dependence: Secondary | ICD-10-CM | POA: Diagnosis not present

## 2023-10-19 DIAGNOSIS — Z6841 Body Mass Index (BMI) 40.0 and over, adult: Secondary | ICD-10-CM | POA: Diagnosis not present

## 2023-10-19 DIAGNOSIS — D638 Anemia in other chronic diseases classified elsewhere: Secondary | ICD-10-CM | POA: Diagnosis present

## 2023-10-19 DIAGNOSIS — J09X1 Influenza due to identified novel influenza A virus with pneumonia: Secondary | ICD-10-CM

## 2023-10-19 DIAGNOSIS — J101 Influenza due to other identified influenza virus with other respiratory manifestations: Secondary | ICD-10-CM | POA: Diagnosis present

## 2023-10-19 DIAGNOSIS — Z8589 Personal history of malignant neoplasm of other organs and systems: Secondary | ICD-10-CM | POA: Diagnosis not present

## 2023-10-19 DIAGNOSIS — E662 Morbid (severe) obesity with alveolar hypoventilation: Secondary | ICD-10-CM | POA: Diagnosis present

## 2023-10-19 DIAGNOSIS — Z8522 Personal history of malignant neoplasm of nasal cavities, middle ear, and accessory sinuses: Secondary | ICD-10-CM | POA: Diagnosis not present

## 2023-10-19 DIAGNOSIS — T501X5A Adverse effect of loop [high-ceiling] diuretics, initial encounter: Secondary | ICD-10-CM | POA: Diagnosis present

## 2023-10-19 DIAGNOSIS — E871 Hypo-osmolality and hyponatremia: Secondary | ICD-10-CM | POA: Diagnosis not present

## 2023-10-19 DIAGNOSIS — I2729 Other secondary pulmonary hypertension: Secondary | ICD-10-CM | POA: Diagnosis present

## 2023-10-19 DIAGNOSIS — E876 Hypokalemia: Secondary | ICD-10-CM | POA: Diagnosis present

## 2023-10-19 DIAGNOSIS — Z833 Family history of diabetes mellitus: Secondary | ICD-10-CM | POA: Diagnosis not present

## 2023-10-19 DIAGNOSIS — E785 Hyperlipidemia, unspecified: Secondary | ICD-10-CM | POA: Diagnosis present

## 2023-10-19 DIAGNOSIS — Z823 Family history of stroke: Secondary | ICD-10-CM | POA: Diagnosis not present

## 2023-10-19 LAB — CBC
HCT: 31.7 % — ABNORMAL LOW (ref 36.0–46.0)
Hemoglobin: 8.2 g/dL — ABNORMAL LOW (ref 12.0–15.0)
MCH: 21.6 pg — ABNORMAL LOW (ref 26.0–34.0)
MCHC: 25.9 g/dL — ABNORMAL LOW (ref 30.0–36.0)
MCV: 83.6 fL (ref 80.0–100.0)
Platelets: 265 10*3/uL (ref 150–400)
RBC: 3.79 MIL/uL — ABNORMAL LOW (ref 3.87–5.11)
RDW: 20.6 % — ABNORMAL HIGH (ref 11.5–15.5)
WBC: 5.9 10*3/uL (ref 4.0–10.5)
nRBC: 1 % — ABNORMAL HIGH (ref 0.0–0.2)

## 2023-10-19 LAB — GLUCOSE, CAPILLARY
Glucose-Capillary: 106 mg/dL — ABNORMAL HIGH (ref 70–99)
Glucose-Capillary: 94 mg/dL (ref 70–99)
Glucose-Capillary: 121 mg/dL — ABNORMAL HIGH (ref 70–99)
Glucose-Capillary: 123 mg/dL — ABNORMAL HIGH (ref 70–99)
Glucose-Capillary: 97 mg/dL (ref 70–99)

## 2023-10-19 LAB — BASIC METABOLIC PANEL
Anion gap: 9 (ref 5–15)
BUN: 23 mg/dL (ref 8–23)
CO2: 33 mmol/L — ABNORMAL HIGH (ref 22–32)
Calcium: 8 mg/dL — ABNORMAL LOW (ref 8.9–10.3)
Chloride: 91 mmol/L — ABNORMAL LOW (ref 98–111)
Creatinine, Ser: 0.82 mg/dL (ref 0.44–1.00)
GFR, Estimated: >60 mL/min (ref >60)
Glucose, Bld: 118 mg/dL — ABNORMAL HIGH (ref 70–99)
Potassium: 3.1 mmol/L — ABNORMAL LOW (ref 3.5–5.1)
Sodium: 133 mmol/L — ABNORMAL LOW (ref 135–145)

## 2023-10-19 LAB — TROPONIN I (HIGH SENSITIVITY): Troponin I (High Sensitivity): 9 ng/L (ref <18)

## 2023-10-19 MED ORDER — POTASSIUM CHLORIDE CRYS ER 20 MEQ PO TBCR
40.0000 meq | EXTENDED_RELEASE_TABLET | Freq: Once | ORAL | Status: AC
  Administered 2023-10-19: 40 meq via ORAL
  Filled 2023-10-19: qty 2

## 2023-10-19 MED ORDER — PHENOL 1.4 % MT LIQD
1.0000 | OROMUCOSAL | Status: DC | PRN
  Administered 2023-10-19: 1 via OROMUCOSAL
  Filled 2023-10-19: qty 177

## 2023-10-19 MED ORDER — ALBUMIN HUMAN 5 % IV SOLN
25.0000 g | Freq: Once | INTRAVENOUS | Status: AC
  Administered 2023-10-19: 12.5 g via INTRAVENOUS
  Filled 2023-10-19: qty 500

## 2023-10-19 MED ORDER — GUAIFENESIN-DM 100-10 MG/5ML PO SYRP
5.0000 mL | ORAL_SOLUTION | ORAL | Status: DC | PRN
Start: 2023-10-19 — End: 2023-10-20
  Administered 2023-10-19 – 2023-10-20 (×4): 5 mL via ORAL
  Filled 2023-10-19 (×4): qty 10

## 2023-10-19 MED ORDER — OSELTAMIVIR PHOSPHATE 75 MG PO CAPS
75.0000 mg | ORAL_CAPSULE | Freq: Two times a day (BID) | ORAL | Status: DC
  Administered 2023-10-19: 75 mg via ORAL
  Filled 2023-10-19 (×3): qty 1

## 2023-10-19 MED ORDER — CARMEX CLASSIC LIP BALM EX OINT
1.0000 | TOPICAL_OINTMENT | CUTANEOUS | Status: DC | PRN
  Administered 2023-10-19: 1 via TOPICAL
  Filled 2023-10-19: qty 10

## 2023-10-19 NOTE — Progress Notes (Signed)
Pt is refusing Lovenox injection and SCDs. Pt educated on importance of these interventions and informed of the risks of refusing.   Pt also refusing prescribed Tamiflu. Pt educated on purpose of medication and why it's prescribed, pt still declined and says she will discuss with MD in the morning about "taking something different".

## 2023-10-19 NOTE — Evaluation (Signed)
Physical Therapy Evaluation Patient Details Name: Jodi [name] MRN: [id number] DOB: [birth year] Today's Date: 10/19/2023  History of Present Illness  63 year old female comes into the hospital with increased shortness of breath and diagnosed with Influenza A, possible bronchitis, hypoxemia. Past medical history of DM 2, pulmonary hypertension, chronic diastolic CHF, morbid obesity, Bipolar disorder, chronic back pain, chronic knee pain, malignant neoplasm of maxillary sinus, Pickwickian syndrome  Clinical Impression  Pt admitted with above diagnosis.  Pt currently with functional limitations due to the deficits listed below (see PT Problem List). Pt will benefit from acute skilled PT to increase their independence and safety with mobility to allow discharge.   Pt reports feeling overall weak but agreeable to transfer OOB to recliner today.  Pt typically transfers without assistive device to a rolling chair at home. Pt uses rolling chair to push herself around to mobilize.  Pt states she feels she needs oxygen at home.  Currently on 2L O2 [place] and SpO2 dropped to 86% during transfer (see below for more details).  Pt will either need to maximize home services and wheelchair, or patient would benefit from continued inpatient follow up therapy, <3 hours/day.          If plan is discharge home, recommend the following: A little help with walking and/or transfers;A little help with bathing/dressing/bathroom;Assistance with cooking/housework;Assist for transportation   Can travel by private vehicle   No    Equipment Recommendations Wheelchair (measurements PT)  Recommendations for Other Services       Functional Status Assessment Patient has had a recent decline in their functional status and demonstrates the ability to make significant improvements in function in a reasonable and predictable amount of time.     Precautions / Restrictions Precautions Precautions: Fall Precaution Comments:  monitor sats      Mobility  Bed Mobility Overal bed mobility: Needs Assistance Bed Mobility: Supine to Sit     Supine to sit: Supervision, HOB elevated, Used rails     General bed mobility comments: increased time and effort    Transfers Overall transfer level: Needs assistance Equipment used: Rolling walker (2 wheels) Transfers: Sit to/from Stand, Bed to chair/wheelchair/BSC Sit to Stand: Min assist   Step pivot transfers: Min assist, +2 safety/equipment       General transfer comment: performed with +2 for safety, light assist for stability, pt typically does not use RW at home for transfers, also SpO2 dropped to 86% on 2L O2 [place] during transfer, returned to low 90s within a couple minutes rest    Ambulation/Gait                  Stairs            Wheelchair Mobility     Tilt Bed    Modified Rankin (Stroke Patients Only)       Balance Overall balance assessment: Mild deficits observed, not formally tested                                           Pertinent Vitals/Pain Pain Assessment Pain Assessment: Faces Faces Pain Scale: Hurts little more Pain Location: back and legs Pain Descriptors / Indicators: Sore, Aching Pain Intervention(s): Monitored during session, Repositioned    Home Living Family/patient expects to be discharged to:: Private residence Living Arrangements: Alone   Type of Home: House  Home Layout: One level        Prior Function Prior Level of Function : Independent/Modified Independent             Mobility Comments: reports she typically transfers to a rolling chair and uses rolling chair to mobilize around home, has been doing this for at least one year per pt       Extremity/Trunk Assessment        Lower Extremity Assessment Lower Extremity Assessment: Generalized weakness    Cervical / Trunk Assessment Cervical / Trunk Assessment: Other exceptions Cervical / Trunk  Exceptions: body habitus  Communication   Communication Communication: No apparent difficulties  Cognition Arousal: Alert Behavior During Therapy: WFL for tasks assessed/performed, Flat affect Overall Cognitive Status: Within Functional Limits for tasks assessed                                          General Comments      Exercises     Assessment/Plan    PT Assessment Patient needs continued PT services  PT Problem List Decreased strength;Decreased activity tolerance;Decreased balance;Decreased mobility;Decreased knowledge of use of DME;Obesity;Cardiopulmonary status limiting activity       PT Treatment Interventions DME instruction;Gait training;Balance training;Functional mobility training;Therapeutic activities;Therapeutic exercise;Patient/family education;Wheelchair mobility training    PT Goals (Current goals can be found in the Care Plan section)  Acute Rehab PT Goals PT Goal Formulation: With patient Time For Goal Achievement: 11/02/23 Potential to Achieve Goals: Fair    Frequency Min 1X/week     Co-evaluation               AM-PAC PT "6 Clicks" Mobility  Outcome Measure Help needed turning from your back to your side while in a flat bed without using bedrails?: A Little Help needed moving from lying on your back to sitting on the side of a flat bed without using bedrails?: A Little Help needed moving to and from a bed to a chair (including a wheelchair)?: A Lot Help needed standing up from a chair using your arms (e.g., wheelchair or bedside chair)?: A Lot Help needed to walk in hospital room?: Total Help needed climbing 3-5 steps with a railing? : Total 6 Click Score: 12    End of Session Equipment Utilized During Treatment: Oxygen Activity Tolerance: Patient tolerated treatment well Patient left: in chair;with call bell/phone within reach;with chair alarm set;with family/visitor present Nurse Communication: Mobility status PT Visit  Diagnosis: Other abnormalities of gait and mobility (R26.89);Muscle weakness (generalized) (M62.81)    Time: 1610-9604 PT Time Calculation (min) (ACUTE ONLY): 10 min   Charges:   PT Evaluation $PT Eval Low Complexity: 1 Low   PT General Charges $$ ACUTE PT VISIT: 1 Visit        Thomasene Mohair PT, DPT Physical Therapist Acute Rehabilitation Services Office: [phone]   Janan Halter [place] 10/19/2023, 12:28 PM

## 2023-10-19 NOTE — ED Notes (Signed)
Apple sauce was given to pt 10 min ago

## 2023-10-19 NOTE — Progress Notes (Signed)
PROGRESS NOTE  Jodi [name] ZOX:[id number] DOB: Jan 19, 1961 DOA: 10/18/2023 PCP: Patient, No Pcp Per   LOS: 0 days   Brief Narrative / Interim history: 63 year old female with history of DM 2, pulmonary hypertension, chronic diastolic CHF, morbid obesity comes into the hospital with increased shortness of breath.  She has been having cough, chest congestion and breathing difficulties for the past couple of days.  Her sister was diagnosed with influenza about a week ago.  She is also telling that she has "mostly" been taking her Lasix at home.  Subjective / 24h Interval events: Continue to complains of a cough, which is becoming productive this morning.  Overall feels slightly better than last night.  No chest discomfort  Assesement and Plan: Principal problem Influenza A, possible bronchitis, hypoxemia-patient tested positive for influenza A, recent contact with her sister having the same virus.  Has been having a productive cough, continue antitussives -Onset couple of days ago, start Tamiflu -Continue supplemental oxygen, currently on 2 L.  Active problems Acute on chronic diastolic CHF -also component of fluid overload, has received few doses of IV Lasix in the ED, hypotensive overnight, hold further diuresis.  Close to euvolemic this morning  Obesity, morbid-BMI 60.  She would benefit from weight loss  Hyponatremia-due to Lasix, monitor  Hypokalemia-replace potassium and continue to monitor.  Check magnesium  Anemia of chronic disease-hemoglobin 8.2, no bleeding.  Watch  Debility-reports that she is not mobile, using an office chair to move around her house and walking minimally.  PT consult  DM2 -controlled, A1c 6.1.  Placed on sliding scale  CBG (last 3)  Recent Labs    10/18/23 2135 10/19/23 0731 10/19/23 0733  GLUCAP 89 94 106*   Lab Results  Component Value Date   HGBA1C 6.1 (H) 09/08/2023    Scheduled Meds:  ARIPiprazole  15 mg Oral QHS   enoxaparin  (LOVENOX) injection  40 mg Subcutaneous Q24H   ferrous sulfate  325 mg Oral BID WC   insulin aspart  0-15 Units Subcutaneous TID WC   insulin aspart  0-5 Units Subcutaneous QHS   oseltamivir  75 mg Oral BID   Continuous Infusions: PRN Meds:.acetaminophen **OR** acetaminophen, albuterol, benzonatate, guaiFENesin-dextromethorphan, ondansetron **OR** ondansetron (ZOFRAN) IV, traZODone  Current Outpatient Medications  Medication Instructions   ARIPiprazole (ABILIFY) 15 mg, Oral, Daily at bedtime   ferrous sulfate 325 mg, Oral, 2 times daily with meals   furosemide (LASIX) 40 mg, Oral, Daily   furosemide (LASIX) 20 mg, Oral, Daily   guaifenesin (HUMIBID E) 400 mg, Oral, Every 4 hours PRN   ibuprofen (ADVIL) 400 mg, Oral, Every 6 hours PRN   metFORMIN (GLUCOPHAGE) 500 mg, Oral, Daily with breakfast   NON FORMULARY 2 drops, Both Eyes, 2 times daily, Family Care redness relief eye drops   potassium chloride SA (KLOR-CON M) 20 MEQ tablet 20 mEq, Oral, Daily   Pseudoeph-Doxylamine-DM-APAP (NYQUIL PO) 15-30 mLs, Oral, As needed    Diet Orders (From admission, onward)     Start     Ordered   10/18/23 1243  Diet heart healthy/carb modified Room service appropriate? Yes; Fluid consistency: Thin  Diet effective now       Question Answer Comment  Diet-HS Snack? Nothing   Room service appropriate? Yes   Fluid consistency: Thin      10/18/23 1242            DVT prophylaxis: enoxaparin (LOVENOX) injection 40 mg Start: 10/18/23 2200 SCDs Start: 10/18/23 1301  Lab Results  Component Value Date   PLT 265 10/19/2023      Code Status: Full Code  Family Communication: Sister present at bedside  Status is: Observation The patient will require care spanning > 2 midnights and should be moved to inpatient because: Continues to have respiratory issues, productive cough, hypoxemia  Level of care: Telemetry  Consultants:  none  Objective: Vitals:   10/19/23 0215 10/19/23 0311 10/19/23  0323 10/19/23 0806  BP: (!) 95/54 92/79  (!) 93/57  Pulse: (!) 50 (!) 56  (!) 53  Resp: 18 20    Temp:  97.6 F (36.4 C)  97.6 F (36.4 C)  TempSrc:  Oral  Oral  SpO2: 98% 96%    Height:   5\' 4"  (1.626 m)     Intake/Output Summary (Last 24 hours) at 10/19/2023 0914 Last data filed at 10/19/2023 0345 Gross per 24 hour  Intake 240 ml  Output --  Net 240 ml   Wt Readings from Last 3 Encounters:  09/10/23 (!) 157.9 kg  08/31/22 117.9 kg  03/21/22 117.9 kg    Examination:  Constitutional: NAD Eyes: no scleral icterus ENMT: Mucous membranes are moist.  Neck: normal, supple Respiratory: Bibasilar rhonchi, no crackles Cardiovascular: Regular rate and rhythm, no murmurs / rubs / gallops.  Trace edema Abdomen: non distended, no tenderness. Bowel sounds positive.  Musculoskeletal: no clubbing / cyanosis.    Data Reviewed: I have independently reviewed following labs and imaging studies   CBC Recent Labs  Lab 10/18/23 1029 10/19/23 0455  WBC 6.5 5.9  HGB 8.3* 8.2*  HCT 32.3* 31.7*  PLT 279 265  MCV 84.1 83.6  MCH 21.6* 21.6*  MCHC 25.7* 25.9*  RDW 20.5* 20.6*  LYMPHSABS 0.7  --   MONOABS 0.6  --   EOSABS 0.1  --   BASOSABS 0.0  --     Recent Labs  Lab 10/18/23 1029 10/18/23 1100 10/18/23 1406 10/19/23 0455  NA 135  --   --  133*  K 3.0*  --   --  3.1*  CL 94*  --   --  91*  CO2 33*  --   --  33*  GLUCOSE 97  --   --  118*  BUN 30*  --   --  23  CREATININE 1.15*  --   --  0.82  CALCIUM 8.4*  --   --  8.0*  AST 18  --   --   --   ALT 15  --   --   --   ALKPHOS 52  --   --   --   BILITOT 0.7  --   --   --   ALBUMIN 3.2*  --   --   --   LATICACIDVEN  --   --  1.0  --   BNP  --  639.3*  --   --     ------------------------------------------------------------------------------------------------------------------ No results for input(s): "CHOL", "HDL", "LDLCALC", "TRIG", "CHOLHDL", "LDLDIRECT" in the last 72 hours.  Lab Results  Component Value Date    HGBA1C 6.1 (H) 09/08/2023   ------------------------------------------------------------------------------------------------------------------ No results for input(s): "TSH", "T4TOTAL", "T3FREE", "THYROIDAB" in the last 72 hours.  Invalid input(s): "FREET3"  Cardiac Enzymes No results for input(s): "CKMB", "TROPONINI", "MYOGLOBIN" in the last 168 hours.  Invalid input(s): "CK" ------------------------------------------------------------------------------------------------------------------    Component Value Date/Time   BNP 639.3 (H) 10/18/2023 1100    CBG: Recent Labs  Lab 10/18/23 1636 10/18/23 2135 10/19/23 0731  10/19/23 0733  GLUCAP 104* 89 94 106*    Recent Results (from the past 240 hours)  Resp panel by RT-PCR (RSV, Flu A&B, Covid) Anterior Nasal Swab     Status: Abnormal   Collection Time: 10/18/23 10:40 AM   Specimen: Anterior Nasal Swab  Result Value Ref Range Status   SARS Coronavirus 2 by RT PCR NEGATIVE NEGATIVE Final    Comment: (NOTE) SARS-CoV-2 target nucleic acids are NOT DETECTED.  The SARS-CoV-2 RNA is generally detectable in upper respiratory specimens during the acute phase of infection. The lowest concentration of SARS-CoV-2 viral copies this assay can detect is 138 copies/mL. A negative result does not preclude SARS-Cov-2 infection and should not be used as the sole basis for treatment or other patient management decisions. A negative result may occur with  improper specimen collection/handling, submission of specimen other than nasopharyngeal swab, presence of viral mutation(s) within the areas targeted by this assay, and inadequate number of viral copies(<138 copies/mL). A negative result must be combined with clinical observations, patient history, and epidemiological information. The expected result is Negative.  Fact Sheet for Patients:  BloggerCourse.com  Fact Sheet for Healthcare Providers:   SeriousBroker.it  This test is no t yet approved or cleared by the [place] FDA and  has been authorized for detection and/or diagnosis of SARS-CoV-2 by FDA under an Emergency Use Authorization (EUA). This EUA will remain  in effect (meaning this test can be used) for the duration of the COVID-19 declaration under Section 564(b)(1) of the Act, 21 U.S.C.section 360bbb-3(b)(1), unless the authorization is terminated  or revoked sooner.       Influenza A by PCR POSITIVE (A) NEGATIVE Final   Influenza B by PCR NEGATIVE NEGATIVE Final    Comment: (NOTE) The Xpert Xpress SARS-CoV-2/FLU/RSV plus assay is intended as an aid in the diagnosis of influenza from Nasopharyngeal swab specimens and should not be used as a sole basis for treatment. Nasal washings and aspirates are unacceptable for Xpert Xpress SARS-CoV-2/FLU/RSV testing.  Fact Sheet for Patients: BloggerCourse.com  Fact Sheet for Healthcare Providers: SeriousBroker.it  This test is not yet approved or cleared by the [place] FDA and has been authorized for detection and/or diagnosis of SARS-CoV-2 by FDA under an Emergency Use Authorization (EUA). This EUA will remain in effect (meaning this test can be used) for the duration of the COVID-19 declaration under Section 564(b)(1) of the Act, 21 U.S.C. section 360bbb-3(b)(1), unless the authorization is terminated or revoked.     Resp Syncytial Virus by PCR NEGATIVE NEGATIVE Final    Comment: (NOTE) Fact Sheet for Patients: BloggerCourse.com  Fact Sheet for Healthcare Providers: SeriousBroker.it  This test is not yet approved or cleared by the [place] FDA and has been authorized for detection and/or diagnosis of SARS-CoV-2 by FDA under an Emergency Use Authorization (EUA). This EUA will remain in effect (meaning this test can be used)  for the duration of the COVID-19 declaration under Section 564(b)(1) of the Act, 21 U.S.C. section 360bbb-3(b)(1), unless the authorization is terminated or revoked.  Performed at [facility], [address] [place], [place] [address]      Radiology Studies: [facility] Chest Port 1 View Result Date: 10/18/2023 CLINICAL DATA:  Shortness of breath EXAM: PORTABLE CHEST 1 VIEW COMPARISON:  09/07/2023 FINDINGS: Study limited by body habitus. Hazy densities project over the mid and lower lungs felt to represent soft tissue attenuation. Low lung volumes. Cardiomegaly, vascular congestion. No definite confluent opacities, effusions or  edema. IMPRESSION: Limited study by body habitus. Cardiomegaly, vascular congestion. Electronically Signed   By: Charlett Nose M.D.   On: 10/18/2023 11:16     Pamella Pert, MD, PhD [facility]  Between 7 am - 7 pm I am available, please contact me via Amion (for emergencies) or Securechat (non urgent messages)  Between 7 pm - 7 am I am not available, please contact night coverage MD/APP via Amion

## 2023-10-19 NOTE — Progress Notes (Signed)
     Patient Name: Jodi [name]           DOB: [birth year]  MRN: [id number]      Admission Date: 10/18/2023  Attending Provider: Maryln Gottron, MD  Primary Diagnosis: Acute CHF (congestive heart failure) (HCC)   Level of care: Telemetry   OVERNIGHT PROGRESS REPORT   Borderline BP-SBP 80-90s.   Asymptomatic.  No acute changes.  All other vital stable. Patient has received a total of 120 mg IV Lasix in the past 24 hours. Previous hemoglobin 8.3.  No signs of bleeding. Serum albumin 3.2.  Placing order for IV albumin x 1    Anthoney Harada, DNP, [employer] [facility] [place]

## 2023-10-20 DIAGNOSIS — I5031 Acute diastolic (congestive) heart failure: Secondary | ICD-10-CM | POA: Diagnosis not present

## 2023-10-20 LAB — CBC
HCT: 30.6 % — ABNORMAL LOW (ref 36.0–46.0)
Hemoglobin: 8 g/dL — ABNORMAL LOW (ref 12.0–15.0)
MCH: 22.3 pg — ABNORMAL LOW (ref 26.0–34.0)
MCHC: 26.1 g/dL — ABNORMAL LOW (ref 30.0–36.0)
MCV: 85.2 fL (ref 80.0–100.0)
Platelets: 271 10*3/uL (ref 150–400)
RBC: 3.59 MIL/uL — ABNORMAL LOW (ref 3.87–5.11)
RDW: 21 % — ABNORMAL HIGH (ref 11.5–15.5)
WBC: 6.3 10*3/uL (ref 4.0–10.5)
nRBC: 2.4 % — ABNORMAL HIGH (ref 0.0–0.2)

## 2023-10-20 LAB — COMPREHENSIVE METABOLIC PANEL
ALT: 12 U/L (ref 0–44)
AST: 15 U/L (ref 15–41)
Albumin: 2.7 g/dL — ABNORMAL LOW (ref 3.5–5.0)
Alkaline Phosphatase: 46 U/L (ref 38–126)
Anion gap: 6 (ref 5–15)
BUN: 16 mg/dL (ref 8–23)
CO2: 36 mmol/L — ABNORMAL HIGH (ref 22–32)
Calcium: 8.3 mg/dL — ABNORMAL LOW (ref 8.9–10.3)
Chloride: 98 mmol/L (ref 98–111)
Creatinine, Ser: 0.84 mg/dL (ref 0.44–1.00)
GFR, Estimated: >60 mL/min (ref >60)
Glucose, Bld: 97 mg/dL (ref 70–99)
Potassium: 3.7 mmol/L (ref 3.5–5.1)
Sodium: 140 mmol/L (ref 135–145)
Total Bilirubin: 0.6 mg/dL (ref 0.0–1.2)
Total Protein: 6.5 g/dL (ref 6.5–8.1)

## 2023-10-20 LAB — MAGNESIUM: Magnesium: 1.8 mg/dL (ref 1.7–2.4)

## 2023-10-20 LAB — PHOSPHORUS: Phosphorus: 2.3 mg/dL — ABNORMAL LOW (ref 2.5–4.6)

## 2023-10-20 LAB — GLUCOSE, CAPILLARY: Glucose-Capillary: 82 mg/dL (ref 70–99)

## 2023-10-20 MED ORDER — GUAIFENESIN-DM 100-10 MG/5ML PO SYRP: 5.0000 mL | ORAL_SOLUTION | ORAL | 0 refills | Status: DC | PRN

## 2023-10-20 MED ORDER — SALINE SPRAY 0.65 % NA SOLN
1.0000 | NASAL | Status: DC | PRN
  Administered 2023-10-20: 1 via NASAL
  Filled 2023-10-20: qty 44

## 2023-10-20 NOTE — TOC CM/SW Note (Signed)
    Durable Medical Equipment  (From admission, onward)           Start     Ordered   10/20/23 0959  For home use only DME Bedside commode  Once       Comments: Wide  Question:  Patient needs a bedside commode to treat with the following condition  Answer:  Morbid obesity (HCC)   10/20/23 0959   10/20/23 0959  For home use only DME standard manual wheelchair with seat cushion  Once       Comments: Patient suffers from obesity which impairs their ability to perform daily activities like bathing, dressing, feeding, grooming, and toileting in the home.  A cane, crutch, or walker will not resolve issue with performing activities of daily living. A wheelchair will allow patient to safely perform daily activities. Patient can safely propel the wheelchair in the home or has a caregiver who can provide assistance. Length of need Lifetime. Accessories: elevating leg rests (ELRs), wheel locks, extensions and anti-tippers. Needs bariatric wheelchair   10/20/23 [phone]   10/20/23 0921  For home use only DME oxygen  Once       Question Answer Comment  Length of Need Lifetime   Mode or (Route) Nasal cannula   Liters per Minute 2   Frequency Continuous (stationary and portable oxygen unit needed)   Oxygen delivery system Gas      10/20/23 [phone]

## 2023-10-20 NOTE — Discharge Instructions (Signed)
Follow with PCP in 1-2 weeks  Please get a complete blood count and chemistry panel checked by your Primary MD at your next visit, and again as instructed by your Primary MD. Please get your medications reviewed and adjusted by your Primary MD.  Please request your Primary MD to go over all Hospital Tests and Procedure/Radiological results at the follow up, please get all Hospital records sent to your Prim MD by signing hospital release before you go home.  In some cases, there will be blood work, cultures and biopsy results pending at the time of your discharge. Please request that your primary care M.D. goes through all the records of your hospital data and follows up on these results.  If you had Pneumonia of Lung problems at the Hospital: Please get a 2 view Chest X ray done in 6-8 weeks after hospital discharge or sooner if instructed by your Primary MD.  If you have Congestive Heart Failure: Please call your Cardiologist or Primary MD anytime you have any of the following symptoms:  1) 3 pound weight gain in 24 hours or 5 pounds in 1 week  2) shortness of breath, with or without a dry hacking cough  3) swelling in the hands, feet or stomach  4) if you have to sleep on extra pillows at night in order to breathe  Follow cardiac low salt diet and 1.5 lit/day fluid restriction.  If you have diabetes Accuchecks 4 times/day, Once in AM empty stomach and then before each meal. Log in all results and show them to your primary doctor at your next visit. If any glucose reading is under 80 or above 300 call your primary MD immediately.  If you have Seizure/Convulsions/Epilepsy: Please do not drive, operate heavy machinery, participate in activities at heights or participate in high speed sports until you have seen by Primary MD or a Neurologist and advised to do so again. Per [place] DMV statutes, patients with seizures are not allowed to drive until they have been seizure-free for six  months.  Use caution when using heavy equipment or power tools. Avoid working on ladders or at heights. Take showers instead of baths. Ensure the water temperature is not too high on the home water heater. Do not go swimming alone. Do not lock yourself in a room alone (i.e. bathroom). When caring for infants or small children, sit down when holding, feeding, or changing them to minimize risk of injury to the child in the event you have a seizure. Maintain good sleep hygiene. Avoid alcohol.   If you had Gastrointestinal Bleeding: Please ask your Primary MD to check a complete blood count within one week of discharge or at your next visit. Your endoscopic/colonoscopic biopsies that are pending at the time of discharge, will also need to followed by your Primary MD.  Get Medicines reviewed and adjusted. Please take all your medications with you for your next visit with your Primary MD  Please request your Primary MD to go over all hospital tests and procedure/radiological results at the follow up, please ask your Primary MD to get all Hospital records sent to his/her office.  If you experience worsening of your admission symptoms, develop shortness of breath, life threatening emergency, suicidal or homicidal thoughts you must seek medical attention immediately by calling 911 or calling your MD immediately  if symptoms less severe.  You must read complete instructions/literature along with all the possible adverse reactions/side effects for all the Medicines you take and that   have been prescribed to you. Take any new Medicines after you have completely understood and accpet all the possible adverse reactions/side effects.   Do not drive or operate heavy machinery when taking Pain medications.   Do not take more than prescribed Pain, Sleep and Anxiety Medications  Special Instructions: If you have smoked or chewed Tobacco  in the last 2 yrs please stop smoking, stop any regular Alcohol  and or any  Recreational drug use.  Wear Seat belts while driving.  Please note You were cared for by a hospitalist during your hospital stay. If you have any questions about your discharge medications or the care you received while you were in the hospital after you are discharged, you can call the unit and asked to speak with the hospitalist on call if the hospitalist that took care of you is not available. Once you are discharged, your primary care physician will handle any further medical issues. Please note that NO REFILLS for any discharge medications will be authorized once you are discharged, as it is imperative that you return to your primary care physician (or establish a relationship with a primary care physician if you do not have one) for your aftercare needs so that they can reassess your need for medications and monitor your lab values.  You can reach the hospitalist office at phone [phone] or fax [phone]   If you do not have a primary care physician, you can call [phone] for a physician referral.  Activity: As tolerated with Full fall precautions use walker/cane & assistance as needed    Diet: regular  Disposition Home  

## 2023-10-20 NOTE — Evaluation (Signed)
Occupational Therapy Evaluation Patient Details Name: Jodi [name] MRN: [id number] DOB: 02/24/1961 Today's Date: 10/20/2023   History of Present Illness 63 year old female comes into the hospital with increased shortness of breath and diagnosed with Influenza A, possible bronchitis, hypoxemia. Past medical history of DM 2, pulmonary hypertension, chronic diastolic CHF, morbid obesity, Bipolar disorder, chronic back pain, chronic knee pain, malignant neoplasm of maxillary sinus, Pickwickian syndrome   Clinical Impression   This 63 yo female admitted with above presents to acute OT with PLOF of being Mod I-min A for basic ADLs, with sister A'ing her prn. She currently is setup-Max A for basic ADLs due to how things are setup her at hospital v. Her home. She was issued a reacher, sock aid , and long handled sponge to take home. No further OT needs, we will sign off without need for follow up.       If plan is discharge home, recommend the following: A little help with walking and/or transfers;A little help with bathing/dressing/bathroom;Assistance with cooking/housework;Assist for transportation;Help with stairs or ramp for entrance    Functional Status Assessment  Patient has had a recent decline in their functional status and demonstrates the ability to make significant improvements in function in a reasonable and predictable amount of time. (all education and AE issued as well as a wide BSC recommended with CM being made aware of this and need for wide W/C.)  Equipment Recommendations   (bariatric BSC and wheelchair)       Precautions / Restrictions Precautions Precautions: Fall Precaution Comments: monitor sats (RN in room checking for O2 sats qualification when I entered) on [name] pt dropped as low as 77% but up to 92% on [name] at rest Restrictions Weight Bearing Restrictions Per Provider Order: No      Mobility Bed Mobility               General bed mobility comments: pt up  on BSC upon my entry    Transfers Overall transfer level: Needs assistance Equipment used: Rolling walker (2 wheels) Transfers: Sit to/from Stand, Bed to chair/wheelchair/BSC Sit to Stand: Min assist     Step pivot transfers: Min assist, +2 safety/equipment            Balance Overall balance assessment: Needs assistance Sitting-balance support: No upper extremity supported, Feet supported Sitting balance-Leahy Scale: Good     Standing balance support: Bilateral upper extremity supported, Reliant on assistive device for balance Standing balance-Leahy Scale: Poor                             ADL either performed or assessed with clinical judgement   ADL Overall ADL's : Needs assistance/impaired Eating/Feeding: Independent;Sitting   Grooming: Set up;Sitting   Upper Body Bathing: Minimal assistance;Sitting;Standing   Lower Body Bathing: Moderate assistance Lower Body Bathing Details (indicate cue type and reason): CGA sit<>stand Upper Body Dressing : Set up;Sitting   Lower Body Dressing: Contact guard assist;Sit to/from stand;With adaptive equipment Lower Body Dressing Details (indicate cue type and reason): educated pt on use of AE for LBB/D and pt returned demonstration for doffing socks and donning socks. Toilet Transfer: Minimal assistance;Squat-pivot;Stand-pivot;Rolling walker (2 wheels)   Toileting- Clothing Manipulation and Hygiene: Total assistance Toileting - Clothing Manipulation Details (indicate cue type and reason): for back peri care in standing, pt reports at home she sits and leans to left or right for peri care--here on wide BSC there is not any  room for her to lean             Vision Patient Visual Report: No change from baseline              Pertinent Vitals/Pain Pain Assessment Pain Assessment: No/denies pain     Extremity/Trunk Assessment Upper Extremity Assessment Upper Extremity Assessment: Generalized weakness            Communication Communication Communication: No apparent difficulties   Cognition Arousal: Alert Behavior During Therapy: WFL for tasks assessed/performed, Flat affect Overall Cognitive Status: Within Functional Limits for tasks assessed                                                  Home Living Family/patient expects to be discharged to:: Private residence Living Arrangements: Alone Available Help at Discharge: Family;Available PRN/intermittently (sister in room reports she is with patient most of the day) Type of Home: House Home Access: Level entry     Home Layout:  (pt reports there are 2 areas in her house that have changes in floor (could not tell me if these were just transitions or changes in floor height from one section to the other). She did say she cannot get her rolling chair over them w/o getting out of it)     Bathroom Shower/Tub: Tub/shower unit;Curtain   Firefighter: Standard     Home Equipment: Cane - single point;Tub bench   Additional Comments: Pt reports tub bench is where the handle is on the outside so it is in the way of her getting in and out of the tub. I recommended to her to check underneath where the seat is and there should with be push tabs or screws where you can take the back off and switch it around to the other side.      Prior Functioning/Environment Prior Level of Function : Independent/Modified Independent             Mobility Comments: reports she typically transfers to a rolling chair and uses rolling chair to mobilize around home, has been doing this for at least one year per pt ADLs Comments: reports she can do most of ADLs on her own--but sister helps with what she cannot do        OT Problem List: Decreased range of motion;Decreased activity tolerance;Impaired balance (sitting and/or standing);Obesity         OT Goals(Current goals can be found in the care plan section) Acute Rehab OT  Goals Patient Stated Goal: to go home today and for her tub bench hand to be on the other side         AM-PAC OT "6 Clicks" Daily Activity     Outcome Measure Help from another person eating meals?: None Help from another person taking care of personal grooming?: A Little Help from another person toileting, which includes using toliet, bedpan, or urinal?: A Lot Help from another person bathing (including washing, rinsing, drying)?: A Little (with AE) Help from another person to put on and taking off regular upper body clothing?: A Little Help from another person to put on and taking off regular lower body clothing?: A Little (with AE) 6 Click Score: 18   End of Session Equipment Utilized During Treatment: Rolling walker (2 wheels) Nurse Communication:  (pt had a bowel movement (the first part was  brown, the 2nd part was black))  Activity Tolerance: Patient tolerated treatment well Patient left: in chair;with call bell/phone within reach;with chair alarm set;with family/visitor present  OT Visit Diagnosis: Unsteadiness on feet (R26.81);Other abnormalities of gait and mobility (R26.89);Muscle weakness (generalized) (M62.81)                Time: 8295-6213 OT Time Calculation (min): 41 min Charges:  OT General Charges $OT Visit: 1 Visit OT Evaluation $OT Eval Moderate Complexity: 1 Mod OT Treatments $Self Care/Home Management : 23-37 mins  Lindon Romp OT Acute Rehabilitation Services Office [phone]    Evette Georges 10/20/2023, 10:54 AM

## 2023-10-20 NOTE — Progress Notes (Signed)
PIV removed. AVS reviewed. Patient received necessary DME to bedside (O2, wheelchair, 3n1).

## 2023-10-20 NOTE — TOC Transition Note (Addendum)
Transition of Care [facility]) - Discharge Note   Patient Details  Name: Jodi [name] MRN: [id number] Date of Birth: 08-Dec-1960  [facility] CM/SW Contact:  Lanier Clam, RN Phone Number: 10/20/2023, 10:03 AM   Clinical Narrative: Patient decline ST SNF-want Home w/HHC-no preference-[facility] for HHPT/OT;[facility] rep [facility] for home 02-travel tank;Wide BSC;w/c all to be brought to rm prior d/c. No further CM needs.      Final next level of care: Home w Home Health Services Barriers to Discharge: No Barriers Identified   Patient Goals and CMS Choice   CMS Medicare.gov Compare Post Acute Care list provided to:: Patient Choice offered to / list presented to : Patient [place] ownership interest in [facility].provided to:: Patient    Discharge Placement                       Discharge Plan and Services Additional resources added to the After Visit Summary for     Discharge Planning Services: CM Consult Post Acute Care Choice: Home Health          DME Arranged: 3-N-1, Oxygen, Wheelchair manual DME Agency: [facility] Date DME Agency Contacted: 10/20/23 Time DME Agency Contacted: 1002 Representative spoke with at DME Agency: [facility] HH Arranged: PT, OT HH Agency: Other - See comment ([facility]) Date HH Agency Contacted: 10/20/23 Time HH Agency Contacted: 1003 Representative spoke with at [facility] Agency: [facility]  Social Drivers of Health (SDOH) Interventions SDOH Screenings   Food Insecurity: No Food Insecurity (10/19/2023)  Housing: Low Risk  (10/19/2023)  Transportation Needs: Unmet Transportation Needs (10/19/2023)  Utilities: Not At Risk (10/19/2023)  Tobacco Use: Medium Risk (10/18/2023)     Readmission Risk Interventions     No data to display

## 2023-10-20 NOTE — Discharge Summary (Addendum)
Physician Discharge Summary  Jodi [name] AVW:[id number] DOB: 10/03/1960 DOA: 10/18/2023  PCP: Patient, No Pcp Per  Admit date: 10/18/2023 Discharge date: 10/20/2023  Admitted From: home Disposition:  home (declining SNF)  Recommendations for Outpatient Follow-up:  Follow up with PCP in 1-2 weeks  Home Health: PT Equipment/Devices: home O2  Discharge Condition: stable CODE STATUS: Full code Diet Orders (From admission, onward)     Start     Ordered   10/18/23 1243  Diet heart healthy/carb modified Room service appropriate? Yes; Fluid consistency: Thin  Diet effective now       Question Answer Comment  Diet-HS Snack? Nothing   Room service appropriate? Yes   Fluid consistency: Thin      10/18/23 1242            HPI: Per admitting MD, Jodi [name] is a 63 y.o. female with medical history significant for non-insulin-dependent type 2 diabetes, pulmonary hypertension, heart failure with preserved EF, morbid obesity being admitted to the hospital with acute exacerbation of heart failure.  She was recently admitted to the hospital in late December 2024 for acute heart failure, at that time felt to be due to medication noncompliance.  Patient today tells me that she has "mostly" been taking her Lasix at home, but feels like she has not been urinating very much.  She has a dry wet sounding cough, and feels short of breath with exertion.  Denies any chest pain, fevers, nausea, vomiting or other concerns.  Her sister has been a little bit sick, today in the ER she also tested positive for influenza A.   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  She was placed on Tamiflu and supportive care with antitussives, with improvement in her condition.  She reported headache and "not feeling  well" after the Tamiflu, and has refused additional doses.  With improvement, she will be discharged home in stable condition.  She did desat with transfers when working with physical therapy, patient reports longstanding shortness of breath with activities at home, suspect she has a degree of chronic hypoxemia in the setting of obesity hypoventilation.  Physical therapy did recommend SNF, however patient wants to go home.  Home health PT will be arranged.   Active problems Acute on chronic diastolic CHF -also component of fluid overload, has received few doses of IV Lasix in the ED, currently appears euvolemic.  She was recommended to be compliant with her home furosemide  Obesity, morbid-BMI 54.9.  She would benefit from weight loss Hyponatremia-due to Lasix, monitor Hypokalemia-potassium replaced  Anemia of chronic disease-hemoglobin stable, no bleeding  Debility-reports that she is not mobile, using an office chair to move around her house and walking minimally.  PT recommends SNF however patient chooses to go home  DM2 -controlled, A1c 6.1.  Resume home medications History of head neck cancer-outpatient follow-up  Sepsis ruled out   Discharge Instructions   Allergies as of 10/20/2023       Reactions   Jardiance [empagliflozin] Shortness Of Breath, Rash   Penicillins Other (See Comments)   Has patient had a PCN reaction causing immediate rash, facial/tongue/throat swelling, SOB or lightheadedness with hypotension: Unknown Has patient had a PCN reaction causing severe rash involving mucus membranes or skin necrosis: Unknown Has patient had a PCN reaction that required hospitalization: No Has patient had a  PCN reaction occurring within the last 10 years: Unknown Patient states her MD informed her that she was allergic. If all of the above answers are "NO", then may proceed with Cephalo        Medication List     STOP taking these medications    guaifenesin 400 MG Tabs  tablet Commonly known as: HUMIBID E       TAKE these medications    ARIPiprazole 15 MG tablet Commonly known as: ABILIFY Take 1 tablet (15 mg total) by mouth at bedtime.   ferrous sulfate 325 (65 FE) MG tablet Take 1 tablet (325 mg total) by mouth 2 (two) times daily with a meal.   furosemide 40 MG tablet Commonly known as: Lasix Take 1 tablet (40 mg total) by mouth daily. What changed: Another medication with the same name was removed. Continue taking this medication, and follow the directions you see here.   guaiFENesin-dextromethorphan 100-10 MG/5ML syrup Commonly known as: ROBITUSSIN DM Take 5 mLs by mouth every 4 (four) hours as needed (persistent cough).   ibuprofen 200 MG tablet Commonly known as: ADVIL Take 400 mg by mouth every 6 (six) hours as needed for mild pain (pain score 1-3), headache or moderate pain (pain score 4-6).   metFORMIN 500 MG tablet Commonly known as: GLUCOPHAGE Take 500 mg by mouth daily with breakfast.   NON FORMULARY Place 2 drops into both eyes in the morning and at bedtime. Family Care redness relief eye drops   NYQUIL PO Take 15-30 mLs by mouth as needed (cough).   potassium chloride SA 20 MEQ tablet Commonly known as: KLOR-CON M Take 1 tablet (20 mEq total) by mouth daily.               Durable Medical Equipment  (From admission, onward)           Start     Ordered   10/20/23 0921  For home use only DME oxygen  Once       Question Answer Comment  Length of Need Lifetime   Mode or (Route) Nasal cannula   Liters per Minute 2   Frequency Continuous (stationary and portable oxygen unit needed)   Oxygen delivery system Gas      10/20/23 0921           Consultations: none  Procedures/Studies:  DG Chest Port 1 View Result Date: 10/18/2023 CLINICAL DATA:  Shortness of breath EXAM: PORTABLE CHEST 1 VIEW COMPARISON:  09/07/2023 FINDINGS: Study limited by body habitus. Hazy densities project over the mid and lower  lungs felt to represent soft tissue attenuation. Low lung volumes. Cardiomegaly, vascular congestion. No definite confluent opacities, effusions or edema. IMPRESSION: Limited study by body habitus. Cardiomegaly, vascular congestion. Electronically Signed   By: Charlett Nose M.D.   On: 10/18/2023 11:16     Subjective: - no chest pain, shortness of breath, no abdominal pain, nausea or vomiting.   Discharge Exam: BP (!) 101/49 (BP Location: Left Arm)   Pulse (!) 51   Temp 98.2 F (36.8 C) (Oral)   Resp 20   Ht 5\' 4"  (1.626 m)   Wt (!) 145.3 kg   SpO2 97%   BMI 54.98 kg/m   General: Pt is alert, awake, not in acute distress Cardiovascular: RRR, S1/S2 +, no rubs, no gallops Respiratory: CTA bilaterally, no wheezing, no rhonchi Abdominal: Soft, NT, ND, bowel sounds +  The results of significant diagnostics from this hospitalization (including imaging, microbiology, ancillary and laboratory)  are listed below for reference.     Microbiology: Recent Results (from the past 240 hours)  Resp panel by RT-PCR (RSV, Flu A&B, Covid) Anterior Nasal Swab     Status: Abnormal   Collection Time: 10/18/23 10:40 AM   Specimen: Anterior Nasal Swab  Result Value Ref Range Status   SARS Coronavirus 2 by RT PCR NEGATIVE NEGATIVE Final    Comment: (NOTE) SARS-CoV-2 target nucleic acids are NOT DETECTED.  The SARS-CoV-2 RNA is generally detectable in upper respiratory specimens during the acute phase of infection. The lowest concentration of SARS-CoV-2 viral copies this assay can detect is 138 copies/mL. A negative result does not preclude SARS-Cov-2 infection and should not be used as the sole basis for treatment or other patient management decisions. A negative result may occur with  improper specimen collection/handling, submission of specimen other than nasopharyngeal swab, presence of viral mutation(s) within the areas targeted by this assay, and inadequate number of viral copies(<138  copies/mL). A negative result must be combined with clinical observations, patient history, and epidemiological information. The expected result is Negative.  Fact Sheet for Patients:  BloggerCourse.com  Fact Sheet for Healthcare Providers:  SeriousBroker.it  This test is no t yet approved or cleared by the [place] FDA and  has been authorized for detection and/or diagnosis of SARS-CoV-2 by FDA under an Emergency Use Authorization (EUA). This EUA will remain  in effect (meaning this test can be used) for the duration of the COVID-19 declaration under Section 564(b)(1) of the Act, 21 U.S.C.section 360bbb-3(b)(1), unless the authorization is terminated  or revoked sooner.       Influenza A by PCR POSITIVE (A) NEGATIVE Final   Influenza B by PCR NEGATIVE NEGATIVE Final    Comment: (NOTE) The Xpert Xpress SARS-CoV-2/FLU/RSV plus assay is intended as an aid in the diagnosis of influenza from Nasopharyngeal swab specimens and should not be used as a sole basis for treatment. Nasal washings and aspirates are unacceptable for Xpert Xpress SARS-CoV-2/FLU/RSV testing.  Fact Sheet for Patients: BloggerCourse.com  Fact Sheet for Healthcare Providers: SeriousBroker.it  This test is not yet approved or cleared by the [place] FDA and has been authorized for detection and/or diagnosis of SARS-CoV-2 by FDA under an Emergency Use Authorization (EUA). This EUA will remain in effect (meaning this test can be used) for the duration of the COVID-19 declaration under Section 564(b)(1) of the Act, 21 U.S.C. section 360bbb-3(b)(1), unless the authorization is terminated or revoked.     Resp Syncytial Virus by PCR NEGATIVE NEGATIVE Final    Comment: (NOTE) Fact Sheet for Patients: BloggerCourse.com  Fact Sheet for Healthcare  Providers: SeriousBroker.it  This test is not yet approved or cleared by the [place] FDA and has been authorized for detection and/or diagnosis of SARS-CoV-2 by FDA under an Emergency Use Authorization (EUA). This EUA will remain in effect (meaning this test can be used) for the duration of the COVID-19 declaration under Section 564(b)(1) of the Act, 21 U.S.C. section 360bbb-3(b)(1), unless the authorization is terminated or revoked.  Performed at [facility], [address] [place], [place] [address]      Labs: Basic Metabolic Panel: Recent Labs  Lab 10/18/23 1029 10/19/23 0455 10/20/23 0620  NA 135 133* 140  K 3.0* 3.1* 3.7  CL 94* 91* 98  CO2 33* 33* 36*  GLUCOSE 97 118* 97  BUN 30* 23 16  CREATININE 1.15* 0.82 0.84  CALCIUM 8.4* 8.0* 8.3*  MG  --   --  1.8  PHOS  --   --  2.3*   Liver Function Tests: Recent Labs  Lab 10/18/23 1029 10/20/23 0620  AST 18 15  ALT 15 12  ALKPHOS 52 46  BILITOT 0.7 0.6  PROT 7.2 6.5  ALBUMIN 3.2* 2.7*   CBC: Recent Labs  Lab 10/18/23 1029 10/19/23 0455 10/20/23 0620  WBC 6.5 5.9 6.3  NEUTROABS 5.0  --   --   HGB 8.3* 8.2* 8.0*  HCT 32.3* 31.7* 30.6*  MCV 84.1 83.6 85.2  PLT 279 265 271   CBG: Recent Labs  Lab 10/19/23 0733 10/19/23 1106 10/19/23 1559 10/19/23 2120 10/20/23 0750  GLUCAP 106* 121* 123* 97 82   Hgb A1c No results for input(s): "HGBA1C" in the last 72 hours. Lipid Profile No results for input(s): "CHOL", "HDL", "LDLCALC", "TRIG", "CHOLHDL", "LDLDIRECT" in the last 72 hours. Thyroid function studies No results for input(s): "TSH", "T4TOTAL", "T3FREE", "THYROIDAB" in the last 72 hours.  Invalid input(s): "FREET3" Urinalysis    Component Value Date/Time   COLORURINE YELLOW 03/19/2022 1920   APPEARANCEUR CLEAR 03/19/2022 1920   APPEARANCEUR Clear 01/14/2014 1331   LABSPEC 1.011 03/19/2022 1920   LABSPEC 1.028 01/14/2014 1331   PHURINE 7.0  03/19/2022 1920   GLUCOSEU NEGATIVE 03/19/2022 1920   GLUCOSEU Negative 01/14/2014 1331   HGBUR NEGATIVE 03/19/2022 1920   BILIRUBINUR NEGATIVE 03/19/2022 1920   BILIRUBINUR Negative 01/14/2014 1331   KETONESUR NEGATIVE 03/19/2022 1920   PROTEINUR NEGATIVE 03/19/2022 1920   NITRITE NEGATIVE 03/19/2022 1920   LEUKOCYTESUR NEGATIVE 03/19/2022 1920   LEUKOCYTESUR Negative 01/14/2014 1331    FURTHER DISCHARGE INSTRUCTIONS:   Get Medicines reviewed and adjusted: Please take all your medications with you for your next visit with your Primary MD   Laboratory/radiological data: Please request your Primary MD to go over all hospital tests and procedure/radiological results at the follow up, please ask your Primary MD to get all Hospital records sent to his/her office.   In some cases, they will be blood work, cultures and biopsy results pending at the time of your discharge. Please request that your primary care M.D. goes through all the records of your hospital data and follows up on these results.   Also Note the following: If you experience worsening of your admission symptoms, develop shortness of breath, life threatening emergency, suicidal or homicidal thoughts you must seek medical attention immediately by calling 911 or calling your MD immediately  if symptoms less severe.   You must read complete instructions/literature along with all the possible adverse reactions/side effects for all the Medicines you take and that have been prescribed to you. Take any new Medicines after you have completely understood and accpet all the possible adverse reactions/side effects.    Do not drive when taking Pain medications or sleeping medications (Benzodaizepines)   Do not take more than prescribed Pain, Sleep and Anxiety Medications. It is not advisable to combine anxiety,sleep and pain medications without talking with your primary care practitioner   Special Instructions: If you have smoked or  chewed Tobacco  in the last 2 yrs please stop smoking, stop any regular Alcohol  and or any Recreational drug use.   Wear Seat belts while driving.   Please note: You were cared for by a hospitalist during your hospital stay. Once you are discharged, your primary care physician will handle any further medical issues. Please note that NO REFILLS for any discharge medications will be authorized once you are discharged, as it is  imperative that you return to your primary care physician (or establish a relationship with a primary care physician if you do not have one) for your post hospital discharge needs so that they can reassess your need for medications and monitor your lab values.  Time coordinating discharge: 40 minutes  SIGNED:  Pamella Pert, MD, PhD 10/20/2023, 9:21 AM

## 2023-10-20 NOTE — Progress Notes (Signed)
SATURATION QUALIFICATIONS: (This note is used to comply with regulatory documentation for home oxygen)  Patient Saturations on Room Air at Rest = 87%  Patient Saturations on Room Air while Ambulating = 77%  Patient Saturations on 2 Liters of oxygen while Ambulating = 92%  Please briefly explain why patient needs home oxygen: Patient dyspneic with exertion.

## 2023-10-22 ENCOUNTER — Other Ambulatory Visit: Payer: Self-pay

## 2023-10-22 LAB — RESPIRATORY PANEL BY PCR

## 2023-11-07 ENCOUNTER — Telehealth: Payer: Self-pay | Admitting: Radiation Oncology

## 2023-11-07 NOTE — Telephone Encounter (Signed)
 Received call from pt asking about records for a dental office in [facility], office advised pt she would need to come pic up and deliver records. Pt provided contact information for [facility]. Pt was advised we normally ask for a medical record request from office and are able to fax it to them directly. Pt was very grateful of this since she relies on family for transportation. I called facility and was unable to speak with anyone or LVM to advise of needing request. I will try again 2/20 to speak with someone to send request to help assist pt and help with record tracking.

## 2023-11-30 ENCOUNTER — Emergency Department (HOSPITAL_COMMUNITY)

## 2023-11-30 ENCOUNTER — Inpatient Hospital Stay (HOSPITAL_COMMUNITY)
Admission: EM | Admit: 2023-11-30 | Discharge: 2023-12-04 | DRG: 205 | Disposition: A | Discharge status: Home / Self Care | Attending: Family Medicine | Admitting: Family Medicine

## 2023-11-30 ENCOUNTER — Other Ambulatory Visit: Payer: Self-pay

## 2023-11-30 ENCOUNTER — Encounter (HOSPITAL_COMMUNITY): Payer: Self-pay

## 2023-11-30 DIAGNOSIS — B974 Respiratory syncytial virus as the cause of diseases classified elsewhere: Secondary | ICD-10-CM | POA: Diagnosis present

## 2023-11-30 DIAGNOSIS — E119 Type 2 diabetes mellitus without complications: Secondary | ICD-10-CM | POA: Diagnosis present

## 2023-11-30 DIAGNOSIS — I5033 Acute on chronic diastolic (congestive) heart failure: Secondary | ICD-10-CM | POA: Diagnosis present

## 2023-11-30 DIAGNOSIS — Z880 Allergy status to penicillin: Secondary | ICD-10-CM | POA: Diagnosis not present

## 2023-11-30 DIAGNOSIS — Z823 Family history of stroke: Secondary | ICD-10-CM

## 2023-11-30 DIAGNOSIS — Z90721 Acquired absence of ovaries, unilateral: Secondary | ICD-10-CM | POA: Diagnosis not present

## 2023-11-30 DIAGNOSIS — B338 Other specified viral diseases: Secondary | ICD-10-CM | POA: Diagnosis not present

## 2023-11-30 DIAGNOSIS — E662 Morbid (severe) obesity with alveolar hypoventilation: Secondary | ICD-10-CM | POA: Diagnosis present

## 2023-11-30 DIAGNOSIS — Z888 Allergy status to other drugs, medicaments and biological substances status: Secondary | ICD-10-CM

## 2023-11-30 DIAGNOSIS — Z7984 Long term (current) use of oral hypoglycemic drugs: Secondary | ICD-10-CM | POA: Diagnosis not present

## 2023-11-30 DIAGNOSIS — D631 Anemia in chronic kidney disease: Secondary | ICD-10-CM | POA: Diagnosis present

## 2023-11-30 DIAGNOSIS — Z87891 Personal history of nicotine dependence: Secondary | ICD-10-CM

## 2023-11-30 DIAGNOSIS — Z79899 Other long term (current) drug therapy: Secondary | ICD-10-CM | POA: Diagnosis not present

## 2023-11-30 DIAGNOSIS — F319 Bipolar disorder, unspecified: Secondary | ICD-10-CM | POA: Diagnosis present

## 2023-11-30 DIAGNOSIS — J9601 Acute respiratory failure with hypoxia: Secondary | ICD-10-CM | POA: Diagnosis present

## 2023-11-30 DIAGNOSIS — Z833 Family history of diabetes mellitus: Secondary | ICD-10-CM | POA: Diagnosis not present

## 2023-11-30 DIAGNOSIS — E785 Hyperlipidemia, unspecified: Secondary | ICD-10-CM | POA: Diagnosis present

## 2023-11-30 DIAGNOSIS — I509 Heart failure, unspecified: Secondary | ICD-10-CM

## 2023-11-30 DIAGNOSIS — G8929 Other chronic pain: Secondary | ICD-10-CM | POA: Diagnosis present

## 2023-11-30 DIAGNOSIS — R001 Bradycardia, unspecified: Secondary | ICD-10-CM | POA: Diagnosis present

## 2023-11-30 DIAGNOSIS — R059 Cough, unspecified: Secondary | ICD-10-CM | POA: Diagnosis present

## 2023-11-30 DIAGNOSIS — E873 Alkalosis: Secondary | ICD-10-CM | POA: Diagnosis present

## 2023-11-30 DIAGNOSIS — Z8522 Personal history of malignant neoplasm of nasal cavities, middle ear, and accessory sinuses: Secondary | ICD-10-CM

## 2023-11-30 DIAGNOSIS — I2722 Pulmonary hypertension due to left heart disease: Secondary | ICD-10-CM | POA: Diagnosis present

## 2023-11-30 DIAGNOSIS — Z8589 Personal history of malignant neoplasm of other organs and systems: Secondary | ICD-10-CM

## 2023-11-30 DIAGNOSIS — J22 Unspecified acute lower respiratory infection: Secondary | ICD-10-CM | POA: Diagnosis present

## 2023-11-30 DIAGNOSIS — Z6841 Body Mass Index (BMI) 40.0 and over, adult: Secondary | ICD-10-CM

## 2023-11-30 LAB — RESP PANEL BY RT-PCR (RSV, FLU A&B, COVID)  RVPGX2
Influenza A by PCR: NEGATIVE
Influenza B by PCR: NEGATIVE
Resp Syncytial Virus by PCR: POSITIVE — AB
SARS Coronavirus 2 by RT PCR: NEGATIVE

## 2023-11-30 LAB — CBC
HCT: 32.6 % — ABNORMAL LOW (ref 36.0–46.0)
Hemoglobin: 8.2 g/dL — ABNORMAL LOW (ref 12.0–15.0)
MCH: 21.1 pg — ABNORMAL LOW (ref 26.0–34.0)
MCHC: 25.2 g/dL — ABNORMAL LOW (ref 30.0–36.0)
MCV: 84 fL (ref 80.0–100.0)
Platelets: 134 10*3/uL — ABNORMAL LOW (ref 150–400)
RBC: 3.88 MIL/uL (ref 3.87–5.11)
RDW: 21.6 % — ABNORMAL HIGH (ref 11.5–15.5)
WBC: 4.8 10*3/uL (ref 4.0–10.5)
nRBC: 0.4 % — ABNORMAL HIGH (ref 0.0–0.2)

## 2023-11-30 LAB — BASIC METABOLIC PANEL
Anion gap: 5 (ref 5–15)
BUN: 13 mg/dL (ref 8–23)
CO2: 39 mmol/L — ABNORMAL HIGH (ref 22–32)
Calcium: 8.6 mg/dL — ABNORMAL LOW (ref 8.9–10.3)
Chloride: 91 mmol/L — ABNORMAL LOW (ref 98–111)
Creatinine, Ser: 0.99 mg/dL (ref 0.44–1.00)
GFR, Estimated: >60 mL/min (ref >60)
Glucose, Bld: 117 mg/dL — ABNORMAL HIGH (ref 70–99)
Potassium: 3.7 mmol/L (ref 3.5–5.1)
Sodium: 135 mmol/L (ref 135–145)

## 2023-11-30 LAB — BRAIN NATRIURETIC PEPTIDE: B Natriuretic Peptide: 316.5 pg/mL — ABNORMAL HIGH (ref 0.0–100.0)

## 2023-11-30 LAB — TROPONIN I (HIGH SENSITIVITY): Troponin I (High Sensitivity): 10 ng/L (ref <18)

## 2023-11-30 MED ORDER — IPRATROPIUM-ALBUTEROL 0.5-2.5 (3) MG/3ML IN SOLN
3.0000 mL | Freq: Once | RESPIRATORY_TRACT | Status: AC
  Administered 2023-11-30: 3 mL via RESPIRATORY_TRACT
  Filled 2023-11-30: qty 3

## 2023-11-30 MED ORDER — METFORMIN HCL 500 MG PO TABS
500.0000 mg | ORAL_TABLET | Freq: Every day | ORAL | Status: DC
  Administered 2023-12-01 – 2023-12-04 (×4): 500 mg via ORAL
  Filled 2023-11-30 (×4): qty 1

## 2023-11-30 MED ORDER — FUROSEMIDE 10 MG/ML IJ SOLN
60.0000 mg | INTRAMUSCULAR | Status: AC
  Filled 2023-11-30: qty 6

## 2023-11-30 MED ORDER — FUROSEMIDE 10 MG/ML IJ SOLN
40.0000 mg | Freq: Two times a day (BID) | INTRAMUSCULAR | Status: AC
  Administered 2023-11-30 – 2023-12-02 (×4): 40 mg via INTRAVENOUS
  Filled 2023-11-30 (×4): qty 4

## 2023-11-30 MED ORDER — GUAIFENESIN-DM 100-10 MG/5ML PO SYRP: 5.0000 mL | ORAL_SOLUTION | ORAL | Status: DC | PRN

## 2023-11-30 MED ORDER — HEPARIN SODIUM (PORCINE) 5000 UNIT/ML IJ SOLN
5000.0000 [IU] | Freq: Three times a day (TID) | INTRAMUSCULAR | Status: DC
  Filled 2023-11-30 (×4): qty 1

## 2023-11-30 MED ORDER — ALBUTEROL SULFATE (2.5 MG/3ML) 0.083% IN NEBU
2.5000 mg | INHALATION_SOLUTION | Freq: Four times a day (QID) | RESPIRATORY_TRACT | Status: DC
  Administered 2023-11-30 – 2023-12-02 (×7): 2.5 mg via RESPIRATORY_TRACT
  Filled 2023-11-30 (×8): qty 3

## 2023-11-30 MED ORDER — ARIPIPRAZOLE 5 MG PO TABS
15.0000 mg | ORAL_TABLET | Freq: Every day | ORAL | Status: DC
  Administered 2023-11-30: 15 mg via ORAL
  Administered 2023-12-02 (×2): 5 mg via ORAL
  Administered 2023-12-03: 15 mg via ORAL
  Filled 2023-11-30 (×5): qty 3

## 2023-11-30 NOTE — ED Provider Notes (Signed)
 [place] EMERGENCY DEPARTMENT AT [facility] Provider Note   CSN: [id number] Arrival date & time: 11/30/23  1226     History  Chief Complaint  Patient presents with   Cough    Jodi [name] is a 63 y.o. female with PMHx anginal/atypical chest pain, bipolar disorder, CHF, DM, headaches, HLD, obesity hypoventilation syndrome who presents to ED concerned for chest pain, SOB, and headaches x2 months. Chest pain is central and intermittent and only happens when patient coughs. SOB is constant and patient is on 2L [place] at baseline which was increased in triage to 4L when patient became hypoxic around 84% on 2L. Patient's headaches are intermittent - she had a headache this morning that resolved with ASA. Also endorses hot and cold flashes.  +20 year smoking hx - quit around 10 years ago.   Denies nausea, vomiting, diarrhea, dysuria, hematuria.    Cough      Home Medications Prior to Admission medications   Medication Sig Start Date End Date Taking? Authorizing Provider  ARIPiprazole (ABILIFY) 15 MG tablet Take 1 tablet (15 mg total) by mouth at bedtime. 02/26/22   Blue, Soijett A, PA-C  ferrous sulfate 325 (65 FE) MG tablet Take 1 tablet (325 mg total) by mouth 2 (two) times daily with a meal. Patient not taking: Reported on 10/18/2023 09/10/23   Burnadette Pop, MD  furosemide (LASIX) 40 MG tablet Take 1 tablet (40 mg total) by mouth daily. Patient not taking: Reported on 10/18/2023 09/10/23 09/09/24  Burnadette Pop, MD  guaiFENesin-dextromethorphan (ROBITUSSIN DM) 100-10 MG/5ML syrup Take 5 mLs by mouth every 4 (four) hours as needed (persistent cough). 10/20/23   Leatha Gilding, MD  ibuprofen (ADVIL) 200 MG tablet Take 400 mg by mouth every 6 (six) hours as needed for mild pain (pain score 1-3), headache or moderate pain (pain score 4-6).    [provider]  metFORMIN (GLUCOPHAGE) 500 MG tablet Take 500 mg by mouth daily with breakfast.    [provider]  NON FORMULARY Place 2 drops into both eyes in the morning and at bedtime. [facility] redness relief eye drops    [provider]  potassium chloride SA (KLOR-CON M) 20 MEQ tablet Take 1 tablet (20 mEq total) by mouth daily. 09/10/23   Burnadette Pop, MD  Pseudoeph-Doxylamine-DM-APAP (NYQUIL PO) Take 15-30 mLs by mouth as needed (cough).    [provider]      Allergies    Jardiance [empagliflozin] and Penicillins    Review of Systems   Review of Systems  Respiratory:  Positive for cough.     Physical Exam Updated Vital Signs BP (!) 140/73 (BP Location: Right Arm)   Pulse 69   Temp 98.9 F (37.2 C)   Resp (!) 24   Ht 5\' 4"  (1.626 m)   Wt 136.1 kg   SpO2 91%   BMI 51.49 kg/m  Physical Exam Vitals and nursing note reviewed.  Constitutional:      General: She is not in acute distress.    Appearance: She is obese. She is ill-appearing (chronically-ill appearing).  HENT:     Head: Normocephalic and atraumatic.     Mouth/Throat:     Mouth: Mucous membranes are moist.  Eyes:     General: No scleral icterus.       Right eye: No discharge.        Left eye: No discharge.     Conjunctiva/sclera: Conjunctivae normal.  Cardiovascular:  Rate and Rhythm: Normal rate and regular rhythm.     Pulses: Normal pulses.     Heart sounds: Normal heart sounds. No murmur heard. Pulmonary:     Effort: Pulmonary effort is normal. No respiratory distress.     Breath sounds: Wheezing present. No rhonchi or rales.     Comments: Expiratory wheezing throughout lung fields. Patient with frequent coughing.  Abdominal:     Tenderness: There is no abdominal tenderness.  Musculoskeletal:     Right lower leg: No edema.     Left lower leg: No edema.  Skin:    General: Skin is warm and dry.     Findings: No rash.     Comments: +1 pitting edema BL LE  Neurological:     General: No focal deficit present.     Mental Status: She is alert and oriented to person,  place, and time. Mental status is at baseline.  Psychiatric:        Mood and Affect: Mood normal.     ED Results / Procedures / Treatments   Labs (all labs ordered are listed, but only abnormal results are displayed) Labs Reviewed  BASIC METABOLIC PANEL  CBC  TROPONIN I (HIGH SENSITIVITY)    EKG EKG Interpretation Date/Time:  Friday November 30 2023 12:36:28 EDT Ventricular Rate:  63 PR Interval:  182 QRS Duration:  94 QT Interval:  390 QTC Calculation: 399 R Axis:   60  Text Interpretation: Sinus rhythm with marked sinus arrhythmia Low voltage QRS When compared with ECG of 18-Oct-2023 14:02, PREVIOUS ECG IS PRESENT Confirmed by Alvester Chou [id number]) on 11/30/2023 12:44:05 PM  Radiology No results found.  Procedures Procedures    Medications Ordered in ED Medications - No data to display  ED Course/ Medical Decision Making/ A&P Clinical Course as of 11/30/23 1448  Fri Nov 30, 2023  1439 Respiratory Syncytial Virus by PCR(!): POSITIVE [MT]    Clinical Course User Index [MT] Trifan, Kermit Balo, MD                                 Medical Decision Making Amount and/or Complexity of Data Reviewed Labs: ordered. Decision-making details documented in ED Course. Radiology: ordered.  Risk Prescription drug management.   This patient presents to the ED for concern of shortness of breath, this involves an extensive number of treatment options, and is a complaint that carries with it a high risk of complications and morbidity.  The differential diagnosis includes Anxiety, Anaphylaxis/Angioedema, Aspirated FB, Arrhythmia, CHF, Asthma, COPD, PNA, COVID/Flu/RSV, STEMI, Tamponade, TPNX, Sepsis   Co morbidities that complicate the patient evaluation  anginal/atypical chest pain, bipolar disorder, CHF, DM, headaches, HLD, obesity hypoventilation syndrome   Additional history obtained:  No PCP listed in chart  Problem List / ED Course / Critical interventions /  Medication management  Patient presents to ED concerned for SOB, chest pain, headache. Patient with chronic headaches - headache this morning resolved with ASA. Patient satting 84% on her chronic 2L [place] in triage which resolved after she was sitting in her ED bed for a couple of minutes. Physical exam with diffuse expiratory wheezing and many coughs. Rest of physical exam reassuring.  I Ordered, and personally interpreted labs.  BNP elevated at 316.  CO2 elevated past patient's baseline currently at 39.  Respiratory panel positive for RSV.  Initial troponin within normal limits.  CBC without leukocytosis.  There is anemia  near patient's baseline at 8.2. Chest xray showing pulmonary edema - ordered patient 60 lasix. The patient was maintained on a cardiac monitor.  I personally viewed and interpreted the cardiac monitored which showed an underlying rhythm of: sinus rhythm. I ordered imaging studies including chest xray to assess for process contributing to patient's symptoms.  This imaging is pending. I have reviewed the patients home medicines and have made adjustments as needed  Social Determinants of Health:     3PM Care of Giamarie Janique Hoefer transferred to [place]  at the end of my shift as the patient will require reassessment once labs/imaging have resulted. Patient presentation, ED course, and plan of care discussed with review of all pertinent labs and imaging. Please see his/her note for further details regarding further ED course and disposition. Plan at time of handoff is reassess patient after imaging and labwork results. Patient will probably need admission for hypoxia and CHF and RSV. This may be altered or completely changed at the discretion of the oncoming team pending results of further workup.         Final Clinical Impression(s) / ED Diagnoses Final diagnoses:  None    Rx / DC Orders ED Discharge Orders     None         Dorthy Cooler, [place] 11/30/23  1519    Terald Sleeper, MD 11/30/23 [phone]

## 2023-11-30 NOTE — ED Notes (Signed)
 Family x2 at [facility]. pCXR at [facility].

## 2023-11-30 NOTE — ED Triage Notes (Signed)
 Pt c/o cough, shortness of breath, headache and chest pain for past 2 months.

## 2023-11-30 NOTE — ED Notes (Signed)
 O2 [place] humidified. Humidification initiated d/t scant blood noted from dry cracked nasal membranes.

## 2023-11-30 NOTE — H&P (Signed)
 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 Prior smoker 10 years   Prior admissions for acute respiratory failure 3/14 present [facility] ED SOB headache chest pain-satting 84% on 2L o2--- states has been sick for the past 3 to 4 days no ill contacts mainly cough cold increasing difficulty with laying flat no chest pain no chills no rigors Sputum present ROM intact Usually ambulates in office chair-does not really walk around much   Review of Systems:  As mentioned above in HPI are pertinent +'s Pertinent negatives as per below  No dark stool tarry stool unilateral weakness burning in the urine diarrhea seizure  ED Course:  In ED given dose of Lasix   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   Moderate to severe pulmonary hypertension (HCC)    Morbid obesity (HCC)    Obesity hypoventilation syndrome (HCC)    On home oxygen therapy    PRN   Pickwickian syndrome (HCC)    Thoracic aortic aneurysm [facility])    Past Surgical History:  Procedure Laterality Date   RIGHT OOPHORECTOMY     SINUS ENDO WITH FUSION N/A  02/13/2017   Procedure: ENDOSCOPIC SINUS ENDO WITH FUSION;  Surgeon: Drema Halon, MD;  Location: [facility] OR;  Service: ENT;  Laterality: N/A;   SINUSOTOMY Right 02/13/2017   Procedure: SINUSOTOMY VIA CALDWELL LUC;  Surgeon: Drema Halon, MD;  Location: [facility] OR;  Service: ENT;  Laterality: Right;   TONSILLECTOMY      reports that she quit smoking about 20 years ago. Her smoking use included cigarettes. She has never used smokeless tobacco. She reports that she does not drink alcohol and does not use drugs.  Mobility: Impaired-uses a wheelchair to get around does not reliably follow  Allergies  Allergen Reactions   Jardiance [Empagliflozin] Shortness Of Breath and Rash   Penicillins Other (See Comments)    Has patient had a PCN reaction causing immediate rash, facial/tongue/throat swelling, SOB or lightheadedness with hypotension: Unknown Has patient had a PCN reaction causing severe rash involving mucus membranes or skin necrosis: Unknown Has patient had a PCN reaction that required hospitalization: No Has patient had a PCN reaction occurring within the last 10 years: Unknown Patient states her MD informed her that she was allergic. If all of the above answers are "NO", then may proceed with Cephalo   Family History  Problem Relation Age of Onset   Cancer Father    Diabetes Other    Stroke Mother  Diabetes Mother    Prior to Admission medications   Medication Sig Start Date End Date Taking? Authorizing Provider  ARIPiprazole (ABILIFY) 15 MG tablet Take 1 tablet (15 mg total) by mouth at bedtime. 02/26/22   Blue, Soijett A, PA-C  ferrous sulfate 325 (65 FE) MG tablet Take 1 tablet (325 mg total) by mouth 2 (two) times daily with a meal. Patient not taking: Reported on 10/18/2023 09/10/23   Burnadette Pop, MD  furosemide (LASIX) 40 MG tablet Take 1 tablet (40 mg total) by mouth daily. Patient not taking: Reported on 10/18/2023 09/10/23 09/09/24  Burnadette Pop, MD   guaiFENesin-dextromethorphan (ROBITUSSIN DM) 100-10 MG/5ML syrup Take 5 mLs by mouth every 4 (four) hours as needed (persistent cough). 10/20/23   Leatha Gilding, MD  ibuprofen (ADVIL) 200 MG tablet Take 400 mg by mouth every 6 (six) hours as needed for mild pain (pain score 1-3), headache or moderate pain (pain score 4-6).    [provider]  metFORMIN (GLUCOPHAGE) 500 MG tablet Take 500 mg by mouth daily with breakfast.    [provider]  NON FORMULARY Place 2 drops into both eyes in the morning and at bedtime. Family Care redness relief eye drops    [provider]  potassium chloride SA (KLOR-CON M) 20 MEQ tablet Take 1 tablet (20 mEq total) by mouth daily. 09/10/23   Burnadette Pop, MD  Pseudoeph-Doxylamine-DM-APAP (NYQUIL PO) Take 15-30 mLs by mouth as needed (cough).    [provider]    Physical Exam:  Vitals:   11/30/23 1500 11/30/23 1515  BP: 119/65 120/71  Pulse: 61 61  Resp: 16 15  Temp:    SpO2: 97% 96%    Chronically debilitated black female with postop changes to right side of eye and face No JVD S1-S2 no murmur no rub no gallop Wheeze/rales posterior laterally Abdomen obese nontender no rebound no guarding Trace lower extremity edema Power 5/5  I have personally reviewed following labs and imaging studies  Labs:  Sodium 135 potassium 3.7 BUN/creatinine 13/0.9 BNP 316 WBC 4.8 hemoglobin 8.2 platelet 134 RSV positive    Imaging studies:   CXR 1 view CHF and pulmonary edema superimposed infectious process not excluded   Medical tests:  EKG independently reviewed: Reviewed-low voltage complexes PR interval 0.20 QRS axis about 90 no ST-T wave changes  Test discussed with performing physician: Yes  Decision to obtain old records:  Yes  Review and summation of old records:  Yes  Principal Problem:   RSV infection   Assessment/Plan RSV infection Symptomatic management only-respiratory failure as below  Acute  hypoxic respiratory failure secondary to RSV superimposed on chronic HFpEF Acute CHF exacerbation BNP slightly up into the 300 range-usually is anywhere from 200-600 Given Lasix 60 in the ED Will place on double dose of home Lasix 40 IV twice daily x 2 doses and reassess-it is not clear if she was taking this medication at home She was discharged on it recently but not sure if she was compliant In addition I will place on albuterol nebs Watch mentation-seems to have OHS habitus and may be retaining  DM TY 2 CBG is not terribly elevated Will resume metformin 500 daily  Depression Continue Abilify 15 at bedtime  Prior history of head and neck CA with ethmoidectomy Outpatient follow-up at [facility]  Superobesity BMI >50 Life-threatening  Severity of Illness: The appropriate patient status for this patient is INPATIENT. Inpatient status is judged to be reasonable and necessary in  order to provide the required intensity of service to ensure the patient's safety. The patient's presenting symptoms, physical exam findings, and initial radiographic and laboratory data in the context of their chronic comorbidities is felt to place them at high risk for further clinical deterioration. Furthermore, it is not anticipated that the patient will be medically stable for discharge from the hospital within 2 midnights of admission.   * I certify that at the point of admission it is my clinical judgment that the patient will require inpatient hospital care spanning beyond 2 midnights from the point of admission due to high intensity of service, high risk for further deterioration and high frequency of surveillance required.*   Family Communication: Discussed with sister who is at bedside  DVT ppx: Heparin Consults called & Whom: None  Time spent: 50 minutes  Mahala Menghini, MD Cordelia Poche my NP partners at night for Care related issues] [facility] --[employer] [employer] OR , [email or web address]; password [facility]   11/30/2023, 4:46 PM

## 2023-11-30 NOTE — Progress Notes (Signed)
 VAST consult for PIV. Patient currently off the unit. Secure chat sent to nurse to consult when patient back in bed and ready for IV team.  Tomasita Morrow, RN VAST

## 2023-11-30 NOTE — ED Notes (Signed)
 I attempted to obtain an IV in pt twice and was unsuccessful

## 2023-11-30 NOTE — ED Provider Notes (Signed)
 Coming in with shortness of breath, chest pain with coughing. Was hypoxic upon arrival but now 95% on baseline 2L [place]. RSV positive and also likely has CHF exacerbation given chest x-ray findings. Physical Exam  BP 120/71   Pulse 61   Temp 98.9 F (37.2 C)   Resp 15   Ht 5\' 4"  (1.626 m)   Wt 136.1 kg   SpO2 96%   BMI 51.49 kg/m   Physical Exam Vitals and nursing note reviewed.  Constitutional:      General: She is not in acute distress.    Appearance: Normal appearance. She is obese. She is not toxic-appearing.     Comments: Chronically ill-appearing  HENT:     Head: Atraumatic.  Pulmonary:     Comments: Increased work of breathing, breathing through the mouth.  Talking in short sentences on 2 L nasal cannula Neurological:     General: No focal deficit present.     Mental Status: She is alert.  Psychiatric:        Mood and Affect: Mood normal.        Behavior: Behavior normal.     Procedures  Procedures  ED Course / MDM   Clinical Course as of 11/30/23 1658  Fri Nov 30, 2023  1439 Respiratory Syncytial Virus by PCR(!): POSITIVE [MT]    Clinical Course User Index [MT] Trifan, Kermit Balo, MD   Medical Decision Making Amount and/or Complexity of Data Reviewed Labs: ordered. Decision-making details documented in ED Course. Radiology: ordered.  Risk Prescription drug management. Decision regarding hospitalization.    Patient received at signout.  Please see prior providers note for full details.  In short, patient presenting with shortness of breath ongoing for the past 2 months, but has had acute worsening in the past for 5 days.    She does use 2 L nasal cannula at baseline, and is currently using this in the room and satting 96%.  She does appear to be breathing more heavily.  Her RSV is positive and BNP elevated.  She also has pulmonary vascular congestion and bilateral opacities on her chest x-ray which is concerning for possible CHF exacerbation versus  infectious etiology.  When asked when she last took her Lasix, she reports she took it last night.  But she states she does not take it all the time.  Will give her dose of Lasix here in the ER for likely CHF exacerbation.  Patient admitted with hospitalist Dr. Mahala Menghini for further management of likely CHF exacerbation and RSV      Jodi [name], Jodi [name] 11/30/23 1658    Tegeler, Jodi Brim, MD 11/30/23 1946

## 2023-11-30 NOTE — ED Notes (Signed)
 Neb complete, returned to 2L [place], VSS, family x2 at [facility].

## 2023-12-01 DIAGNOSIS — B338 Other specified viral diseases: Secondary | ICD-10-CM | POA: Diagnosis not present

## 2023-12-01 LAB — COMPREHENSIVE METABOLIC PANEL
ALT: 14 U/L (ref 0–44)
AST: 17 U/L (ref 15–41)
Albumin: 2.8 g/dL — ABNORMAL LOW (ref 3.5–5.0)
Alkaline Phosphatase: 42 U/L (ref 38–126)
Anion gap: 12 (ref 5–15)
BUN: 13 mg/dL (ref 8–23)
CO2: 37 mmol/L — ABNORMAL HIGH (ref 22–32)
Calcium: 8.4 mg/dL — ABNORMAL LOW (ref 8.9–10.3)
Chloride: 87 mmol/L — ABNORMAL LOW (ref 98–111)
Creatinine, Ser: 0.92 mg/dL (ref 0.44–1.00)
GFR, Estimated: >60 mL/min (ref >60)
Glucose, Bld: 131 mg/dL — ABNORMAL HIGH (ref 70–99)
Potassium: 3.3 mmol/L — ABNORMAL LOW (ref 3.5–5.1)
Sodium: 136 mmol/L (ref 135–145)
Total Bilirubin: 0.8 mg/dL (ref 0.0–1.2)
Total Protein: 6.7 g/dL (ref 6.5–8.1)

## 2023-12-01 MED ORDER — POLYVINYL ALCOHOL 1.4 % OP SOLN
1.0000 [drp] | OPHTHALMIC | Status: DC | PRN
  Filled 2023-12-01: qty 15

## 2023-12-01 MED ORDER — DEXTROMETHORPHAN POLISTIREX ER 30 MG/5ML PO SUER
30.0000 mg | Freq: Two times a day (BID) | ORAL | Status: DC
  Administered 2023-12-01: 30 mg via ORAL
  Filled 2023-12-01: qty 5

## 2023-12-01 NOTE — Progress Notes (Signed)
 Patient refused to take abilify, at first she is concerned that it was 3 tablets as she only takes 1 tablet at home, packaging shown to patient that 1 tablet is only 5 mg and she takes 15 mg even at home. Patient stated that the dose last night made her too sleepy this morning and she doesn't want to take any for tonight. Medication wasted.

## 2023-12-01 NOTE — Progress Notes (Signed)
 Had a lengthy conversation with patient about fluid restriction order of 1500 ml/day, patient states that she urinates a lot only when she drinks a lot, of note, she is currently being diuresed for this admission. She has a cup of water at bedside and wants a new cup of cola, advised that she can choose between the two drinks so that we can accurately monitor her intake, she refuses and states she wants both drink at bedside. Fluid intake at daytime is not on file but during rounds she has empty can of shasta and 3 cups empty as well at bedside. Oral care done on patient and gave 1/2 cup of ice to soothe dryness of mouth.

## 2023-12-01 NOTE — Plan of Care (Signed)

## 2023-12-01 NOTE — Progress Notes (Signed)
 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 Prior smoker 10 years    Prior admissions for acute respiratory failure  3/14 present [facility] ED SOB headache chest pain-satting 84% on 2L o2--- states has been sick for the past 3 to 4 days no ill contacts mainly cough cold increasing difficulty with laying flat no chest pain no chills no rigors Sputum present ROM intact Usually ambulates in office chair-does not really walk around much  She was found to be RSV [+] and had acute respiratory failure requiring 4 L of oxygen    Plan  RSV infection Supportive management keep on contact precautions Still with some cough.  Use albuterol inhalers  Acute decompensated HFpEF hypoxic respiratory failure on admission -1.2 L since admission Prior weight was 117 kg-here she is 136 kg and she will need further diuresis with Lasix IV 40 twice daily She will need education about salt and fluid restriction it appears she might have been taking Lasix 20 at home but she is not certain and I am not convinced she was compliant She is not on GDMT we will consider the same Decreased oxygen to 2 L to see how she sats Therapy to see eventually  Prior maxillary sinus neoplasm with ethmoidectomy 2020 Has some postop changes-will resume her  Depression Continue aripiprazole 15 at bedtime   DVT prophylaxis: Heparin  Status is: Inpatient Remains inpatient appropriate because:   Requires further management and fluid loss    Subjective: Awake coherent no distress looks fair feels all right drinking a lot of fluids No chest pain   Objective + exam Vitals:   12/01/23 0247 12/01/23 0458 12/01/23 0749 12/01/23 0904  BP:  120/75 (!) 110/57   Pulse:  70 70   Resp:  17 16   Temp:  (!) 100.7 F (38.2 C) (!) 100.4 F (38 C)   TempSrc:  Oral Oral   SpO2: 97% 92% (!) 80% 94%  Weight:      Height:       Filed Weights   11/30/23 1231  Weight: 136.1 kg    Examination: EOMI NCAT postop changes to right side of face/right eye S1-S2 no murmur no rub no gallop Some rales rhonchi posterior laterally Abdomen obese nontender no rebound no guarding Lower extremity edema present bilaterally Psych euthymic coherent   Data Reviewed: reviewed   CBC    Component Value Date/Time   WBC 4.8 11/30/2023 1242   RBC 3.88 11/30/2023 1242   HGB 8.2 (L) 11/30/2023 1242   HGB 13.2 04/16/2014 1404   HCT 32.6 (L) 11/30/2023 1242   HCT 42.7 04/16/2014 1404   PLT 134 (L) 11/30/2023 1242   PLT 122 (L) 04/16/2014 1404   MCV 84.0 11/30/2023 1242   MCV 92 04/16/2014 1404   MCH 21.1 (L) 11/30/2023 1242   MCHC 25.2 (L) 11/30/2023 1242   RDW 21.6 (H) 11/30/2023 1242   RDW 16.0 (H) 04/16/2014 1404   LYMPHSABS 0.7 10/18/2023 1029   LYMPHSABS 1.5 04/16/2014 1404  MONOABS 0.6 10/18/2023 1029   MONOABS 0.7 04/16/2014 1404   EOSABS 0.1 10/18/2023 1029   EOSABS 0.1 04/16/2014 1404   BASOSABS 0.0 10/18/2023 1029   BASOSABS 0.1 04/16/2014 1404      Latest Ref Rng & Units 11/30/2023   12:42 PM 10/20/2023    6:20 AM 10/19/2023    4:55 AM  CMP  Glucose 70 - 99 mg/dL 161  97  096   BUN 8 - 23 mg/dL 13  16  23    Creatinine 0.44 - 1.00 mg/dL 0.45  4.09  8.11   Sodium 135 - 145 mmol/L 135  140  133   Potassium 3.5 - 5.1 mmol/L 3.7  3.7  3.1   Chloride 98 - 111 mmol/L 91  98  91   CO2 22 - 32 mmol/L 39  36  33   Calcium 8.9 - 10.3 mg/dL 8.6  8.3  8.0   Total Protein 6.5 - 8.1 g/dL  6.5    Total Bilirubin 0.0 - 1.2 mg/dL  0.6     Alkaline Phos 38 - 126 U/L  46    AST 15 - 41 U/L  15    ALT 0 - 44 U/L  12      Scheduled Meds:  albuterol  2.5 mg Nebulization Q6H   ARIPiprazole  15 mg Oral QHS   furosemide  40 mg Intravenous BID   furosemide  60 mg Intravenous STAT   heparin  5,000 Units Subcutaneous Q8H   metFORMIN  500 mg Oral Q breakfast   Continuous Infusions:  Time  33  Rhetta Mura, MD  [facility]

## 2023-12-01 NOTE — Plan of Care (Signed)
   Problem: Education: Goal: Knowledge of General Education information will improve Description Including pain rating scale, medication(s)/side effects and non-pharmacologic comfort measures Outcome: Progressing   Problem: Health Behavior/Discharge Planning: Goal: Ability to manage health-related needs will improve Outcome: Progressing

## 2023-12-02 DIAGNOSIS — B338 Other specified viral diseases: Secondary | ICD-10-CM | POA: Diagnosis not present

## 2023-12-02 LAB — BASIC METABOLIC PANEL
Anion gap: 10 (ref 5–15)
BUN: 14 mg/dL (ref 8–23)
CO2: 41 mmol/L — ABNORMAL HIGH (ref 22–32)
Calcium: 8.3 mg/dL — ABNORMAL LOW (ref 8.9–10.3)
Chloride: 84 mmol/L — ABNORMAL LOW (ref 98–111)
Creatinine, Ser: 0.82 mg/dL (ref 0.44–1.00)
GFR, Estimated: >60 mL/min (ref >60)
Glucose, Bld: 107 mg/dL — ABNORMAL HIGH (ref 70–99)
Potassium: 3.3 mmol/L — ABNORMAL LOW (ref 3.5–5.1)
Sodium: 135 mmol/L (ref 135–145)

## 2023-12-02 MED ORDER — ACETAMINOPHEN 325 MG PO TABS
650.0000 mg | ORAL_TABLET | Freq: Four times a day (QID) | ORAL | Status: DC | PRN
Start: 2023-12-02 — End: 2023-12-04
  Administered 2023-12-02 – 2023-12-04 (×6): 650 mg via ORAL
  Filled 2023-12-02 (×6): qty 2

## 2023-12-02 MED ORDER — FUROSEMIDE 10 MG/ML IJ SOLN
80.0000 mg | Freq: Two times a day (BID) | INTRAMUSCULAR | Status: DC
  Administered 2023-12-02 – 2023-12-03 (×3): 80 mg via INTRAVENOUS
  Filled 2023-12-02 (×3): qty 8

## 2023-12-02 MED ORDER — GUAIFENESIN-DM 100-10 MG/5ML PO SYRP
5.0000 mL | ORAL_SOLUTION | ORAL | Status: DC | PRN
  Administered 2023-12-02 – 2023-12-04 (×10): 5 mL via ORAL
  Filled 2023-12-02 (×10): qty 10

## 2023-12-02 MED ORDER — ALBUTEROL SULFATE (2.5 MG/3ML) 0.083% IN NEBU: 2.5000 mg | INHALATION_SOLUTION | RESPIRATORY_TRACT | Status: DC | PRN

## 2023-12-02 MED ORDER — BENZONATATE 100 MG PO CAPS
100.0000 mg | ORAL_CAPSULE | Freq: Once | ORAL | Status: AC | PRN
  Administered 2023-12-02: 100 mg via ORAL
  Filled 2023-12-02: qty 1

## 2023-12-02 NOTE — Progress Notes (Signed)
 Patient only agreed to have 5mg  of abilify for tonight. Tessalon pearls given for coughing.

## 2023-12-02 NOTE — Progress Notes (Signed)
 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  Use albuterol inhalers--overall seems improved  Acute decompensated HFpEF hypoxic respiratory failure on admission - 2.1 L since admission Increase diuresis to 80 twice daily--wght inaccurate--currently now 146 kg?  Lasix 20 not convinced she was compliant She is not on GDMT we will consider the same Decreased oxygen to 2 L to see how she sats over the next 1 to 2 days  Prior maxillary sinus neoplasm with ethmoidectomy 2020 Has some postop changes-will resume her  Depression Continue aripiprazole 15 at bedtime   I updated her sister at the bedside  DVT prophylaxis: Heparin  Status is: Inpatient Remains inpatient appropriate because:   Requires further management and fluid loss   Subjective: Still some cough-shortness of breath is slightly improved She has been made aware of need for  fluid restriction and standing weight She really wants to go home but I have explained to her we need to get her fluid status improved  Objective + exam Vitals:   12/02/23 0609 12/02/23 0612 12/02/23 0623 12/02/23 0850  BP:  (!) 97/48    Pulse:      Resp:      Temp:      TempSrc:      SpO2: 92% 97% 98% 99%  Weight:      Height:       Filed Weights   11/30/23 1231 12/01/23 1800 12/02/23 0500  Weight: 136.1 kg (!) 139.9 kg (!) 146.3 kg    Examination: EOMI NCAT postop changes to right side of face/right eye S1-S2 no murmur no rub no gallop--- she does have JVD at 30 degrees although feels less short of breath laying flat Some rales and wheezes posterolaterally poor exam Abdomen obese nontender no rebound no guarding Lower extremity edema present bilaterally Psych euthymic coherent   Data Reviewed: reviewed   CBC    Component Value Date/Time   WBC 4.8 11/30/2023 1242   RBC 3.88 11/30/2023 1242   HGB 8.2 (L) 11/30/2023 1242   HGB 13.2 04/16/2014 1404   HCT 32.6 (L) 11/30/2023 1242   HCT 42.7 04/16/2014 1404   PLT 134 (L) 11/30/2023 1242   PLT 122 (L) 04/16/2014 1404   MCV 84.0 11/30/2023 1242   MCV 92 04/16/2014 1404   MCH 21.1 (L) 11/30/2023 1242   MCHC 25.2 (L) 11/30/2023 1242   RDW 21.6 (H) 11/30/2023 1242  RDW 16.0 (H) 04/16/2014 1404   LYMPHSABS 0.7 10/18/2023 1029   LYMPHSABS 1.5 04/16/2014 1404   MONOABS 0.6 10/18/2023 1029   MONOABS 0.7 04/16/2014 1404   EOSABS 0.1 10/18/2023 1029   EOSABS 0.1 04/16/2014 1404   BASOSABS 0.0 10/18/2023 1029   BASOSABS 0.1 04/16/2014 1404      Latest Ref Rng & Units 12/02/2023    7:55 AM 12/01/2023   10:38 AM 11/30/2023   12:42 PM  CMP  Glucose 70 - 99 mg/dL 161  096  045   BUN 8 - 23 mg/dL 14  13  13    Creatinine 0.44 - 1.00 mg/dL 4.09  8.11  9.14   Sodium 135 - 145 mmol/L 135  136  135   Potassium 3.5 - 5.1 mmol/L 3.3  3.3  3.7   Chloride 98 - 111 mmol/L 84  87  91   CO2 22 - 32 mmol/L 41  37  39   Calcium 8.9 -  10.3 mg/dL 8.3  8.4  8.6   Total Protein 6.5 - 8.1 g/dL  6.7    Total Bilirubin 0.0 - 1.2 mg/dL  0.8    Alkaline Phos 38 - 126 U/L  42    AST 15 - 41 U/L  17    ALT 0 - 44 U/L  14      Scheduled Meds:  albuterol  2.5 mg Nebulization Q6H   ARIPiprazole  15 mg Oral QHS   heparin  5,000 Units Subcutaneous Q8H   metFORMIN  500 mg Oral Q breakfast   Continuous Infusions:  Time  23  Rhetta Mura, MD  [facility]

## 2023-12-02 NOTE — Plan of Care (Signed)

## 2023-12-02 NOTE — Progress Notes (Addendum)
 Patient's saturation noted to be 66%, patient wasn't wearing her nasal cannula, hooked back patient to nasal cannula and increased to 6 lpm, saturation back to 92%.  Patient reminded to always keep nasal cannula in place. Dr. Loney Loh made aware of situation , patient currently back to baseline 2 Lpm at 98%.  BP relayed to Dr. Loney Loh as well.

## 2023-12-02 NOTE — Plan of Care (Signed)
  Problem: Clinical Measurements: Goal: Ability to maintain clinical measurements within normal limits will improve Outcome: Not Progressing   Problem: Clinical Measurements: Goal: Will remain free from infection Outcome: Not Progressing   Problem: Clinical Measurements: Goal: Diagnostic test results will improve Outcome: Not Progressing   Problem: Clinical Measurements: Goal: Respiratory complications will improve Outcome: Not Progressing   Problem: Clinical Measurements: Goal: Cardiovascular complication will be avoided Outcome: Not Progressing   Problem: Safety: Goal: Ability to remain free from injury will improve Outcome: Not Progressing   Problem: Skin Integrity: Goal: Risk for impaired skin integrity will decrease Outcome: Not Progressing

## 2023-12-02 NOTE — Progress Notes (Signed)
 Patient refused to have standing weight taken, states that she rarely stands at home, usually uses a rolling chair to navigate the house, she states that her legs feels very weak at the moment, patient may benefit for Physical Therapy assessment.

## 2023-12-02 NOTE — Progress Notes (Addendum)
 Patient insists that she wants her oxygen level to be increased, she is at 2 lpm at the moment and sats at 95 %, explained to her that with that saturation level, we don't need to raise it, she also has no wheezing at the moment. She then stated that she wants her robitussin, informed her that it was discontinued as it was changed to delsym that she took at 1800, patient stated it didn't help her and she feels that robitussin worked better. Dr. Loney Loh updated about patient's concern.  0100- followed up cough medicine request to Dr. Loney Loh.

## 2023-12-03 DIAGNOSIS — B338 Other specified viral diseases: Secondary | ICD-10-CM | POA: Diagnosis not present

## 2023-12-03 MED ORDER — POTASSIUM CHLORIDE CRYS ER 20 MEQ PO TBCR
40.0000 meq | EXTENDED_RELEASE_TABLET | Freq: Every day | ORAL | Status: DC
  Administered 2023-12-03 – 2023-12-04 (×2): 40 meq via ORAL
  Filled 2023-12-03 (×2): qty 2

## 2023-12-03 MED ORDER — ACETAZOLAMIDE 250 MG PO TABS
500.0000 mg | ORAL_TABLET | Freq: Two times a day (BID) | ORAL | Status: DC
  Filled 2023-12-03 (×5): qty 2

## 2023-12-03 NOTE — Plan of Care (Signed)

## 2023-12-03 NOTE — Progress Notes (Signed)
 Patient intially agreed to wear SCD machine sleeves. When this RN rounded with medications patient stated " I don't want to wear those things, ill be hot and they will be uncomfortable on my legs". Patient also refuses the Diamox mediation again.Patient states " no I only need to take my lasix and that's it.  MD has been notified and made aware and stated " tell her she needs it and to take it" this RN told  patient that MD recommend that she needs it in her care. Patient still declines at this time. Patient has been education on both medications and purpose of SCDs multiple times.

## 2023-12-03 NOTE — Progress Notes (Signed)
 Patient told nightshift nurse she was unable to walk lastnight so standing weight was not taken due to patient inability to stand up.   Patient able to walk as of now, Charge RN got patients standing weight. Documented in chart.  Paitent declined to received subQ heparin even after education with risk and complications informed to her. This RN paged Rhetta Mura, MD and states to do SCDs on patient.  Patient agrees to SCDs. This RN placed.

## 2023-12-03 NOTE — Progress Notes (Signed)
 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 Prior smoker 10 years    Prior admissions for acute respiratory failure  3/14 present [facility] ED SOB headache chest pain-satting 84% on 2L o2--- states has been sick for the past 3 to 4 days no ill contacts mainly cough cold increasing difficulty with laying flat no chest pain no chills no rigors Sputum present ROM intact Usually ambulates in office chair-does not really walk around much  She was found to be RSV [+] and had acute respiratory failure requiring 4 L of oxygen She was volume overloaded and started on IV lasix    Plan  RSV infection Supportive management keep on contact precautions Cough resolved albuterol inhalers  Acute decompensated HFpEF hypoxic respiratory failure on admission - 4.1 L since admission Increase diuresis to  lasix 80 IV twice daily--wght 146-->144 She is not on GDMT we will consider the same--not sure if was compliant on lasix 20 at home Decreased oxygen to 2 L to see how she sats over the next 1 to 2 days  Met alkalosis Started diamox 500 bid Monitor contraction alkalosis from diuretics  Prior maxillary sinus neoplasm with ethmoidectomy 2020 Has some postop changes-will resume her meds  Anemia Borderline microcytic--present since dec 2024 May need OP work-up--might need OP colonoscopy if never done  Depression Continue aripiprazole 15 at bedtime   I updated her sister at the bedside daily  DVT prophylaxis:  Heparin  Status is: Inpatient Remains inpatient appropriate because:   Requires further management and fluid loss   Subjective:  Well less cough no wheeze No sputum Overall feels better  Objective + exam Vitals:   12/03/23 0100 12/03/23 0500 12/03/23 0621 12/03/23 0740  BP:  110/65  115/64  Pulse:  60  (!) 55  Resp:  17  16  Temp:  98 F (36.7 C)  97.9 F (36.6 C)  TempSrc:  Oral  Oral  SpO2: 98% 97%  100%  Weight:   (!) 144.8 kg   Height:       Filed Weights   12/01/23 1800 12/02/23 0500 12/03/23 0621  Weight: (!) 139.9 kg (!) 146.3 kg (!) 144.8 kg    Examination:  Eomi ncat no focal deficit Cta b no wheeze rales Mild jvd decreased from prior S1 s2 no m Abd obese nt nd no rebound trace le edema Power 55  Data Reviewed: reviewed   CBC    Component Value Date/Time   WBC 4.8 11/30/2023 1242   RBC 3.88 11/30/2023 1242   HGB 8.2 (L) 11/30/2023 1242   HGB 13.2 04/16/2014 1404   HCT 32.6 (L) 11/30/2023 1242   HCT 42.7 04/16/2014 1404   PLT 134 (L) 11/30/2023 1242   PLT 122 (L) 04/16/2014 1404   MCV 84.0 11/30/2023 1242   MCV 92 04/16/2014 1404   MCH 21.1 (L) 11/30/2023 1242   MCHC 25.2 (L) 11/30/2023 1242   RDW 21.6 (H) 11/30/2023 1242   RDW 16.0 (H) 04/16/2014 1404   LYMPHSABS 0.7 10/18/2023  1029   LYMPHSABS 1.5 04/16/2014 1404   MONOABS 0.6 10/18/2023 1029   MONOABS 0.7 04/16/2014 1404   EOSABS 0.1 10/18/2023 1029   EOSABS 0.1 04/16/2014 1404   BASOSABS 0.0 10/18/2023 1029   BASOSABS 0.1 04/16/2014 1404      Latest Ref Rng & Units 12/02/2023    7:55 AM 12/01/2023   10:38 AM 11/30/2023   12:42 PM  CMP  Glucose 70 - 99 mg/dL 161  096  045   BUN 8 - 23 mg/dL 14  13  13    Creatinine 0.44 - 1.00 mg/dL 4.09  8.11  9.14   Sodium 135 - 145 mmol/L 135  136  135   Potassium 3.5 - 5.1 mmol/L 3.3  3.3  3.7   Chloride 98 - 111 mmol/L 84  87  91   CO2 22 - 32 mmol/L 41  37  39   Calcium 8.9 - 10.3 mg/dL 8.3  8.4  8.6   Total Protein 6.5 - 8.1 g/dL  6.7     Total Bilirubin 0.0 - 1.2 mg/dL  0.8    Alkaline Phos 38 - 126 U/L  42    AST 15 - 41 U/L  17    ALT 0 - 44 U/L  14      Scheduled Meds:  acetaZOLAMIDE  500 mg Oral BID   ARIPiprazole  15 mg Oral QHS   furosemide  80 mg Intravenous BID   heparin  5,000 Units Subcutaneous Q8H   metFORMIN  500 mg Oral Q breakfast   potassium chloride  40 mEq Oral Daily   Continuous Infusions:  Time  23  Rhetta Mura, MD  [facility]

## 2023-12-03 NOTE — Plan of Care (Signed)
  Problem: Clinical Measurements: Goal: Will remain free from infection Outcome: Not Progressing   Problem: Clinical Measurements: Goal: Respiratory complications will improve Outcome: Not Progressing   Problem: Clinical Measurements: Goal: Cardiovascular complication will be avoided Outcome: Not Progressing   Problem: Safety: Goal: Ability to remain free from injury will improve Outcome: Not Progressing   Problem: Skin Integrity: Goal: Risk for impaired skin integrity will decrease Outcome: Not Progressing

## 2023-12-04 DIAGNOSIS — B338 Other specified viral diseases: Secondary | ICD-10-CM | POA: Diagnosis not present

## 2023-12-04 LAB — COMPREHENSIVE METABOLIC PANEL
ALT: 10 U/L (ref 0–44)
AST: 13 U/L — ABNORMAL LOW (ref 15–41)
Albumin: 2.7 g/dL — ABNORMAL LOW (ref 3.5–5.0)
Alkaline Phosphatase: 41 U/L (ref 38–126)
Anion gap: 15 (ref 5–15)
BUN: 12 mg/dL (ref 8–23)
CO2: 41 mmol/L — ABNORMAL HIGH (ref 22–32)
Calcium: 8.4 mg/dL — ABNORMAL LOW (ref 8.9–10.3)
Chloride: 84 mmol/L — ABNORMAL LOW (ref 98–111)
Creatinine, Ser: 0.85 mg/dL (ref 0.44–1.00)
GFR, Estimated: >60 mL/min (ref >60)
Glucose, Bld: 87 mg/dL (ref 70–99)
Potassium: 3.2 mmol/L — ABNORMAL LOW (ref 3.5–5.1)
Sodium: 140 mmol/L (ref 135–145)
Total Bilirubin: 1.1 mg/dL (ref 0.0–1.2)
Total Protein: 6.8 g/dL (ref 6.5–8.1)

## 2023-12-04 MED ORDER — FUROSEMIDE 80 MG PO TABS
ORAL_TABLET | ORAL | 1 refills | Status: AC
Start: 2023-12-04 — End: 2024-01-13

## 2023-12-04 MED ORDER — ACETAZOLAMIDE 250 MG PO TABS: 500.0000 mg | ORAL_TABLET | Freq: Two times a day (BID) | ORAL | 0 refills | Status: AC

## 2023-12-04 MED ORDER — GUAIFENESIN-DM 100-10 MG/5ML PO SYRP: 5.0000 mL | ORAL_SOLUTION | ORAL | 0 refills | Status: DC | PRN

## 2023-12-04 NOTE — Progress Notes (Signed)
 Patient ride has arrived. Discharge Nurse has already education on discharge instructions. This RN removed PIV. Patient voiced no complaints or concerns. Sister at bedside. Patient transported via wheelchair to lobby.

## 2023-12-04 NOTE — Care Management (Addendum)
 Received call from RN Erin who reports patient is being discharged however does not have her portable home oxygen. Per chart review patient currently inpatient at [facility] for shortness of breath, RSV infection and patient has home O2 with [facility].  This RNCM spoke with Denyse Amass with [employer] after hours team who reports they will deliver portable home oxygen tank to patient at bedside.   This RNCM notified RN that [employer] will contact her or the patient once driver is on the way to deliver home O2.  No additional TOC needs at this time.

## 2023-12-04 NOTE — Progress Notes (Addendum)
 Printed and reviewed AVS with patient.  Floor to finish discharge when ride arrives.

## 2023-12-04 NOTE — Progress Notes (Signed)
 This RN attempted medication pass, patient wants to wait until after she eats breakfast to receive medications.

## 2023-12-04 NOTE — Plan of Care (Signed)

## 2023-12-04 NOTE — Discharge Summary (Signed)
 Physician Discharge Summary  Jodi [name] EAV:[id number] DOB: 03/31/1961 DOA: 11/30/2023  PCP: Patient, No Pcp Per  Admit date: 11/30/2023 Discharge date: 12/04/2023  Time spent: 33 minutes  Recommendations for Outpatient Follow-up:  Recommend twice daily dosing of Lasix 80 and then Lasix once daily and close monitoring with labs in the outpatient setting given risk for contraction alkalosis and AKI-patient was insistent on leaving the hospital and did not want to stay until she became a little bit drier Needs close PCP follow-up-have instructed patient to make sure that this occurs Needs fluid restriction unclear if she will comply  Discharge Diagnoses:  MAIN problem for hospitalization   RSV infection Superimposed acute decompensated HFpEF  Please see below for itemized issues addressed in HOpsital- refer to other progress notes for clarity if needed  Discharge Condition: Guarded  Diet recommendation: Heart healthy  63 yr old blk female home dwelling NIDDM HFpEF with pulmonary hypertension-PE ruled out-on chronic oxygen 2 L at baseline Super morbid obesity class III BMI 54 Anemia renal disease 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 Prior smoker 10 years    Prior admissions for acute respiratory failure   3/14 present [facility] ED SOB headache chest pain-satting 84% on 2L o2--- states has been sick for the past 3 to 4 days no ill contacts mainly cough cold increasing difficulty with laying flat no chest pain no chills no rigors Sputum present ROM intact Usually ambulates in office chair-does not really walk around much   She was found to be RSV [+] and had acute respiratory failure requiring 4 L of oxygen She was volume overloaded and started on IV lasix     Plan   RSV infection Supportive management keep on contact precautions Cough resolved albuterol inhalers   Acute  decompensated HFpEF hypoxic respiratory failure on admission - 4.1 L since admission Increase diuresis to  lasix 80 IV twice daily--wght was around 1 46-1 47-it was felt that she would benefit from continued diuresis but she wanted to go home but did not want to stay in the hospital-she had documented noncompliance with Diamox fluid restriction and with ambulation despite being instructed on this She is not on GDMT we will consider the same--not sure if was compliant on lasix 20 at home Patient will discharge back home on 2 L oxygen   Met alkalosis Started diamox 500 bid Monitor contraction alkalosis from diuretics Nursing mentions that she is somewhat noncompliant-I am instructed her clearly to take this I gave her prescription for at least 10 days at discharge as we will be aggressively diuresing her She is aware that she needs labs   Prior maxillary sinus neoplasm with ethmoidectomy 2020 Has some postop changes-will resume her meds   Anemia Borderline microcytic--present since dec 2024 May need OP work-up--might need OP colonoscopy if never done   Depression Continue aripiprazole 15 at bedtime  Filed Weights   12/03/23 1300 12/04/23 1300 12/04/23 1303  Weight: (!) 139.7 kg 119.6 kg (!) 147.3 kg     Discharge Exam: Vitals:   12/04/23 0437 12/04/23 0748  BP: (!) 103/56 101/61  Pulse: 64 (!) 58  Resp: 18 16  Temp: 98.9 F (37.2 C) 98.4 F (36.9 C)  SpO2: (!) 75% 98%    Subj on day of d/c   Awake coherent no distress breathing better No chest pain Cough note: No fever no chills  General Exam on discharge  EOMI NCAT  no wheeze rales rhonchi Abdomen soft no power 5/5 Trace lower extremity edema Some JVD still present Psych euthymic S1-S2 no murmur  Discharge Instructions   Discharge Instructions     Diet - low sodium heart healthy   Complete by: As directed    Discharge instructions   Complete by: As directed    Make sure you use the lasix 80 bid, and  then go to a lower dose of lasix 80 daily--it is important that u get labs to check on your kidney function Make sur ehtta you monitor your fluid accumulation tredns a You need to sue the diamox--make sure that u dont miss taking it Make sure that if u have chest pain, fever nausea vomit , that u rpesent back tot the emergency room   Increase activity slowly   Complete by: As directed       Allergies as of 12/04/2023       Reactions   Jardiance [empagliflozin] Shortness Of Breath, Rash   Penicillins Other (See Comments)   Has patient had a PCN reaction causing immediate rash, facial/tongue/throat swelling, SOB or lightheadedness with hypotension: Unknown Has patient had a PCN reaction causing severe rash involving mucus membranes or skin necrosis: Unknown Has patient had a PCN reaction that required hospitalization: No Has patient had a PCN reaction occurring within the last 10 years: Unknown Patient states her MD informed her that she was allergic. If all of the above answers are "NO", then may proceed with Cephalo        Medication List     STOP taking these medications    ibuprofen 200 MG tablet Commonly known as: ADVIL   NYQUIL PO       TAKE these medications    acetaminophen 500 MG tablet Commonly known as: TYLENOL Take 1,000 mg by mouth every 6 (six) hours as needed for mild pain (pain score 1-3) or moderate pain (pain score 4-6).   acetaZOLAMIDE 250 MG tablet Commonly known as: DIAMOX Take 2 tablets (500 mg total) by mouth 2 (two) times daily.   ARIPiprazole 15 MG tablet Commonly known as: ABILIFY Take 1 tablet (15 mg total) by mouth at bedtime.   ferrous sulfate 325 (65 FE) MG tablet Take 1 tablet (325 mg total) by mouth 2 (two) times daily with a meal.   furosemide 80 MG tablet Commonly known as: Lasix Take 1 tablet (80 mg total) by mouth 2 (two) times daily for 10 days, THEN 1 tablet (80 mg total) daily. Start taking on: December 04, 2023 What changed:   medication strength See the new instructions.   guaiFENesin-dextromethorphan 100-10 MG/5ML syrup Commonly known as: ROBITUSSIN DM Take 5 mLs by mouth every 4 (four) hours as needed for cough.   metFORMIN 500 MG tablet Commonly known as: GLUCOPHAGE Take 500 mg by mouth daily with breakfast.   NON FORMULARY Place 2 drops into both eyes in the morning and at bedtime. Family [facility] redness relief eye drops   potassium chloride SA 20 MEQ tablet Commonly known as: KLOR-CON M Take 1 tablet (20 mEq total) by mouth daily.       Allergies  Allergen Reactions   Jardiance [Empagliflozin] Shortness Of Breath and Rash   Penicillins Other (See Comments)    Has patient had a PCN reaction causing immediate rash, facial/tongue/throat swelling, SOB or lightheadedness with hypotension: Unknown Has patient had a PCN reaction causing severe rash involving mucus membranes or skin necrosis: Unknown Has patient had a PCN reaction that  required hospitalization: No Has patient had a PCN reaction occurring within the last 10 years: Unknown Patient states her MD informed her that she was allergic. If all of the above answers are "NO", then may proceed with Cephalo      The results of significant diagnostics from this hospitalization (including imaging, microbiology, ancillary and laboratory) are listed below for reference.    Significant Diagnostic Studies: DG Chest Portable 1 View Result Date: 11/30/2023 CLINICAL DATA:  Chest pain EXAM: PORTABLE CHEST 1 VIEW COMPARISON:  10/18/2023 FINDINGS: Cardiomegaly. Aortic atherosclerosis. Pulmonary vascular congestion. Bilateral interstitial opacities, most pronounced within the perihilar and bibasilar regions. No pleural effusion or pneumothorax. IMPRESSION: Appearance favors CHF and pulmonary edema. Superimposed infectious process not excluded. Electronically Signed   By: Duanne Guess D.O.   On: 11/30/2023 15:07    Microbiology: Recent Results (from the  past 240 hours)  Resp panel by RT-PCR (RSV, Flu A&B, Covid) Anterior Nasal Swab     Status: Abnormal   Collection Time: 11/30/23  1:18 PM   Specimen: Anterior Nasal Swab  Result Value Ref Range Status   SARS Coronavirus 2 by RT PCR NEGATIVE NEGATIVE Final   Influenza A by PCR NEGATIVE NEGATIVE Final   Influenza B by PCR NEGATIVE NEGATIVE Final    Comment: (NOTE) The Xpert Xpress SARS-CoV-2/FLU/RSV plus assay is intended as an aid in the diagnosis of influenza from Nasopharyngeal swab specimens and should not be used as a sole basis for treatment. Nasal washings and aspirates are unacceptable for Xpert Xpress SARS-CoV-2/FLU/RSV testing.  Fact Sheet for Patients: BloggerCourse.com  Fact Sheet for Healthcare Providers: SeriousBroker.it  This test is not yet approved or cleared by the [place] FDA and has been authorized for detection and/or diagnosis of SARS-CoV-2 by FDA under an Emergency Use Authorization (EUA). This EUA will remain in effect (meaning this test can be used) for the duration of the COVID-19 declaration under Section 564(b)(1) of the Act, 21 U.S.C. section 360bbb-3(b)(1), unless the authorization is terminated or revoked.     Resp Syncytial Virus by PCR POSITIVE (A) NEGATIVE Final    Comment: (NOTE) Fact Sheet for Patients: BloggerCourse.com  Fact Sheet for Healthcare Providers: SeriousBroker.it  This test is not yet approved or cleared by the [place] FDA and has been authorized for detection and/or diagnosis of SARS-CoV-2 by FDA under an Emergency Use Authorization (EUA). This EUA will remain in effect (meaning this test can be used) for the duration of the COVID-19 declaration under Section 564(b)(1) of the Act, 21 U.S.C. section 360bbb-3(b)(1), unless the authorization is terminated or revoked.  Performed at [facility], [address] [place], [place] [address]      Labs: Basic Metabolic Panel: Recent Labs  Lab 11/30/23 1242 12/01/23 1038 12/02/23 0755 12/04/23 0831  NA 135 136 135 140  K 3.7 3.3* 3.3* 3.2*  CL 91* 87* 84* 84*  CO2 39* 37* 41* 41*  GLUCOSE 117* 131* 107* 87  BUN 13 13 14 12   CREATININE 0.99 0.92 0.82 0.85  CALCIUM 8.6* 8.4* 8.3* 8.4*   Liver Function Tests: Recent Labs  Lab 12/01/23 1038 12/04/23 0831  AST 17 13*  ALT 14 10  ALKPHOS 42 41  BILITOT 0.8 1.1  PROT 6.7 6.8  ALBUMIN 2.8* 2.7*   No results for input(s): "LIPASE", "AMYLASE" in the last 168 hours. No results for input(s): "AMMONIA" in the last 168 hours. CBC: Recent Labs  Lab 11/30/23 1242  WBC 4.8  HGB  8.2*  HCT 32.6*  MCV 84.0  PLT 134*   Cardiac Enzymes: No results for input(s): "CKTOTAL", "CKMB", "CKMBINDEX", "TROPONINI" in the last 168 hours. BNP: BNP (last 3 results) Recent Labs    09/07/23 1945 10/18/23 1100 11/30/23 1242  BNP 220.8* 639.3* 316.5*    ProBNP (last 3 results) No results for input(s): "PROBNP" in the last 8760 hours.  CBG: No results for input(s): "GLUCAP" in the last 168 hours.  Signed:  Rhetta Mura MD   [facility] 12/04/2023, 1:57 PM

## 2023-12-04 NOTE — Progress Notes (Signed)
 Received from nightshift report. Daily weight not taken. Patient is reporting that she cannot get up, patient voided in bed throughout night, did not get up to void. Patient complains of weakness. Patient refused heparin, SCDs, Diamox. Patient non-compliant with fluid restriction.

## 2023-12-04 NOTE — Progress Notes (Addendum)
 Patient states she is not waiting for home O2 take to be delivered and states " my ride cannot wait any longer and ill be alright til I get home, I'm leaving" . This RN educated on the risks on not having her O2 tank with her because of her oxygen level would drop and causing her short or breath and could potentially stop if not having adequate oxygen. Patient still wanting to leave.   Rhetta Mura MD paged and made aware. He stated to make patient known of risk. This RN educated patient and which her and sister at bedside still requesting to leave.

## 2023-12-04 NOTE — Plan of Care (Signed)
   Problem: Education: Goal: Knowledge of General Education information will improve Description: Including pain rating scale, medication(s)/side effects and non-pharmacologic comfort measures Outcome: Progressing   Problem: Pain Managment: Goal: General experience of comfort will improve and/or be controlled Outcome: Progressing   Problem: Safety: Goal: Ability to remain free from injury will improve Outcome: Progressing

## 2023-12-05 ENCOUNTER — Other Ambulatory Visit: Payer: Self-pay

## 2024-02-06 ENCOUNTER — Encounter (HOSPITAL_COMMUNITY): Payer: Self-pay

## 2024-02-06 ENCOUNTER — Ambulatory Visit (HOSPITAL_COMMUNITY): Admission: EM | Admit: 2024-02-06 | Discharge: 2024-02-06 | Disposition: A | Discharge status: Home / Self Care

## 2024-02-06 DIAGNOSIS — H02102 Unspecified ectropion of right lower eyelid: Secondary | ICD-10-CM

## 2024-02-06 DIAGNOSIS — H6123 Impacted cerumen, bilateral: Secondary | ICD-10-CM

## 2024-02-06 MED ORDER — ERYTHROMYCIN 5 MG/GM OP OINT: TOPICAL_OINTMENT | OPHTHALMIC | 0 refills | Status: AC

## 2024-02-06 MED ORDER — CARBAMIDE PEROXIDE 6.5 % OT SOLN: 5.0000 [drp] | Freq: Two times a day (BID) | OTIC | 0 refills | Status: AC

## 2024-02-06 NOTE — ED Triage Notes (Signed)
 Patient here today with c/o bilat ear fullness X 5-7 days.   Patient also c/o watery eyes that has been going on for months now. Patient uses OTC eye drops. Patient states that she uses any kind that she can afford. Sometimes Visine.

## 2024-02-06 NOTE — ED Provider Notes (Signed)
 [facility]    CSN: [id number] Arrival date & time: 02/06/24  1549      History   Chief Complaint Chief Complaint  Patient presents with   Ear Fullness    HPI Jodi [name] is a 63 y.o. female.   Patient presents to clinic over concern of bilateral ear fullness and pressure for the past week or so.  She is also concerned over right sided eye droop and discharge.  The eye has been watery for the past month but recently she had some purulent discharge below the eye over the past few days.  Is not having any pain or itching.  No drainage from the eye.  Right eye droop is not new.  The history is provided by the patient and medical records.  Ear Fullness    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   Moderate to severe pulmonary hypertension (HCC)    Morbid obesity (HCC)    Obesity hypoventilation syndrome (HCC)    On home oxygen therapy    PRN   Pickwickian syndrome (HCC)    Thoracic aortic aneurysm [facility])     Patient Active Problem List   Diagnosis Date Noted   RSV infection 11/30/2023   Acute CHF (congestive heart failure) (HCC) 10/18/2023   Anemia 09/07/2023   Sinus bradycardia 09/07/2023   Severe bipolar affective disorder with psychosis (HCC) 03/20/2022   Schizophrenia (HCC) 03/20/2022   Major depressive disorder, recurrent, 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   Counseling regarding advance care planning and goals of care 06/05/2019   Goals of care, counseling/discussion 01/17/2019   Malignant neoplasm of maxillary sinus (HCC)  02/13/2017   Bipolar disorder, in partial remission, most recent episode manic (HCC)    Pulmonary hypertension (HCC)    Acute respiratory failure with hypoxia and hypercarbia (HCC)    Morbid obesity due to excess calories (HCC)    Nocturnal hypoxemia due to obesity    Acute on chronic diastolic (congestive) heart failure (HCC) 12/03/2015   CHF (congestive heart failure) (HCC) 11/18/2015   Non compliance with medical treatment 09/16/2015   Bipolar disorder (HCC) 09/15/2015   Type 2 diabetes mellitus (HCC) 09/15/2015   Morbid obesity (HCC) 09/15/2015    Past Surgical History:  Procedure Laterality Date   RIGHT OOPHORECTOMY     SINUS ENDO WITH FUSION N/A 02/13/2017   Procedure: ENDOSCOPIC SINUS ENDO WITH FUSION;  Surgeon: Prescott Brodie, MD;  Location: [facility] OR;  Service: ENT;  Laterality: N/A;   SINUSOTOMY Right 02/13/2017   Procedure: SINUSOTOMY VIA CALDWELL LUC;  Surgeon: Prescott Brodie, MD;  Location: [facility] OR;  Service: ENT;  Laterality: Right;   TONSILLECTOMY      OB History   No obstetric history on file.      Home Medications    Prior to Admission medications   Medication Sig Start Date End Date Taking? Authorizing Provider  carbamide peroxide (DEBROX) 6.5 %  OTIC solution Place 5 drops into both ears 2 (two) times daily. 02/06/24  Yes Madelyne Millikan  N, FNP  D 1000 25 MCG (1000 UT) capsule Take 1,000 Units by mouth daily. 11/27/23  Yes [provider]  erythromycin ophthalmic ointment Place a 1/2 inch ribbon of ointment into right lower eyelid area 4x daily x5 days 02/06/24  Yes Harlow Lighter, Ertha Nabor  N, FNP  acetaminophen  (TYLENOL ) 500 MG tablet Take 1,000 mg by mouth every 6 (six) hours as needed for mild pain (pain score 1-3) or moderate pain (pain score 4-6).    [provider]  acetaZOLAMIDE  (DIAMOX ) 250 MG tablet Take 2 tablets (500 mg total) by mouth 2 (two) times daily. Patient not taking: Reported on 02/06/2024 12/04/23   Samtani, Jai-Gurmukh, MD   ARIPiprazole  (ABILIFY ) 15 MG tablet Take 1 tablet (15 mg total) by mouth at bedtime. 02/26/22   Blue, Soijett A, PA-C  ferrous sulfate  325 (65 FE) MG tablet Take 1 tablet (325 mg total) by mouth 2 (two) times daily with a meal. 09/10/23   Leona Rake, MD  furosemide  (LASIX ) 80 MG tablet Take 1 tablet (80 mg total) by mouth 2 (two) times daily for 10 days, THEN 1 tablet (80 mg total) daily. 12/04/23 01/13/24  Samtani, Jai-Gurmukh, MD  metFORMIN  (GLUCOPHAGE ) 500 MG tablet Take 500 mg by mouth daily with breakfast.    [provider]  NON FORMULARY Place 2 drops into both eyes in the morning and at bedtime. [facility] redness relief eye drops    [provider]  potassium chloride  SA (KLOR-CON  M) 20 MEQ tablet Take 1 tablet (20 mEq total) by mouth daily. 09/10/23   Leona Rake, MD    Family History Family History  Problem Relation Age of Onset   Cancer Father    Diabetes Other    Stroke Mother    Diabetes Mother     Social History Social History   Tobacco Use   Smoking status: Former    Current packs/day: 0.00    Types: Cigarettes    Quit date: 09/19/2003    Years since quitting: 20.3   Smokeless tobacco: Never   Tobacco comments:    greater than 14 years ago  Vaping Use   Vaping status: Never Used  Substance Use Topics   Alcohol  use: Never    Comment: rarely   Drug use: Never     Allergies   Jardiance [empagliflozin] and Penicillins   Review of Systems Review of Systems  Per HPI  Physical Exam Triage Vital Signs ED Triage Vitals  Encounter Vitals Group     BP 02/06/24 1614 134/71     Systolic BP Percentile --      Diastolic BP Percentile --      Pulse Rate 02/06/24 1614 65     Resp 02/06/24 1614 16     Temp 02/06/24 1614 98.3 F (36.8 C)     Temp Source 02/06/24 1614 Oral     SpO2 02/06/24 1614 90 %     Weight --      Height --      Head Circumference --      Peak Flow --      Pain Score 02/06/24 1619 0     Pain Loc --      Pain  Education --      Exclude from Growth Chart --    No data found.  Updated Vital Signs BP 134/71 (BP Location: Left Arm)   Pulse 65  Temp 98.3 F (36.8 C) (Oral)   Resp 16   SpO2 90%   Visual Acuity Right Eye Distance:   Left Eye Distance:   Bilateral Distance:    Right Eye Near:   Left Eye Near:    Bilateral Near:     Physical Exam Vitals and nursing note reviewed.  Constitutional:      Appearance: Normal appearance.  HENT:     Head: Normocephalic and atraumatic.     Right Ear: There is impacted cerumen.     Left Ear: There is impacted cerumen.     Nose: Nose normal.     Mouth/Throat:     Mouth: Mucous membranes are moist.  Eyes:     Pupils: Pupils are equal, round, and reactive to light.   Cardiovascular:     Rate and Rhythm: Normal rate.  Pulmonary:     Effort: Pulmonary effort is normal. No respiratory distress.  Skin:    General: Skin is warm and dry.  Neurological:     General: No focal deficit present.     Mental Status: She is alert.  Psychiatric:        Mood and Affect: Mood normal.      Media Information   Document Information  UC Treatments / Results  Labs (all labs ordered are listed, but only abnormal results are displayed) Labs Reviewed - No data to display  EKG   Radiology No results found.  Procedures Procedures (including critical care time)  Medications Ordered in UC Medications - No data to display  Initial Impression / Assessment and Plan / UC Course  I have reviewed the triage vital signs and the nursing notes.  Pertinent labs & imaging results that were available during my care of the patient were reviewed by me and considered in my medical decision making (see chart for details).  Vitals in triage reviewed, patient is hemodynamically stable.  Ectropion of right lower eyelid noted with raw area and purulent discharge, see media for picture.  Concerning with purulent drainage for infection, will prescribe topical  erythromycin.  Bilateral cerumen impaction noted with ear fullness and diminished hearing.  Staff to perform irrigation.  Afterwards tympanic membranes are pearly gray, external auditory canal without erythema or drainage.  Advised Debrox.  Plan of care, follow-up care return precautions given, no questions at this time.    Final Clinical Impressions(s) / UC Diagnoses   Final diagnoses:  Bilateral impacted cerumen  Ectropion of right lower eyelid, unspecified ectropion type     Discharge Instructions      Use the antibiotic eye ointment to the raw area of your right lower eyelid.  Please cleanse the area and do not apply any make-up until the area is fully healed.  Use the eye ointment 4 times daily for the next 5 days.  Use the wax drops 1-2 times daily to help prevent buildup of wax.  Return to clinic for any new or urgent symptoms.   ED Prescriptions     Medication Sig Dispense Auth. Provider   erythromycin ophthalmic ointment Place a 1/2 inch ribbon of ointment into right lower eyelid area 4x daily x5 days 3.5 g Harlow Lighter, Estiven Kohan  N, FNP   carbamide peroxide (DEBROX) 6.5 % OTIC solution Place 5 drops into both ears 2 (two) times daily. 15 mL Harlow Lighter, Phillipe Clemon  N, FNP      PDMP not reviewed this encounter.   Harlow Lighter, Dhrithi Riche  N, FNP 02/06/24 1736

## 2024-02-06 NOTE — Discharge Instructions (Addendum)
 Use the antibiotic eye ointment to the raw area of your right lower eyelid.  Please cleanse the area and do not apply any make-up until the area is fully healed.  Use the eye ointment 4 times daily for the next 5 days.  Use the wax drops 1-2 times daily to help prevent buildup of wax.  Return to clinic for any new or urgent symptoms.

## 2024-02-08 ENCOUNTER — Other Ambulatory Visit: Payer: Self-pay

## 2024-03-10 ENCOUNTER — Encounter: Payer: Self-pay | Admitting: Hematology

## 2024-03-10 ENCOUNTER — Other Ambulatory Visit (HOSPITAL_COMMUNITY): Payer: Self-pay | Admitting: Otolaryngology

## 2024-03-10 DIAGNOSIS — C760 Malignant neoplasm of head, face and neck: Secondary | ICD-10-CM

## 2024-03-11 ENCOUNTER — Other Ambulatory Visit: Payer: Self-pay

## 2024-03-13 ENCOUNTER — Encounter (HOSPITAL_COMMUNITY): Payer: Self-pay

## 2024-03-13 ENCOUNTER — Ambulatory Visit (HOSPITAL_COMMUNITY)

## 2024-03-18 ENCOUNTER — Encounter: Payer: Self-pay | Admitting: Hematology

## 2024-03-25 ENCOUNTER — Ambulatory Visit (HOSPITAL_COMMUNITY)
Admission: RE | Admit: 2024-03-25 | Discharge: 2024-03-25 | Disposition: A | Discharge status: Home / Self Care | Source: Ambulatory Visit | Attending: Otolaryngology | Admitting: Otolaryngology

## 2024-03-25 DIAGNOSIS — C760 Malignant neoplasm of head, face and neck: Secondary | ICD-10-CM | POA: Diagnosis present

## 2024-03-25 LAB — POCT I-STAT CREATININE: Creatinine, Ser: 0.9 mg/dL (ref 0.44–1.00)

## 2024-03-25 MED ORDER — IOHEXOL 300 MG/ML  SOLN
80.0000 mL | Freq: Once | INTRAMUSCULAR | Status: AC | PRN
Start: 2024-03-25 — End: 2024-03-25
  Administered 2024-03-25: 80 mL via INTRAVENOUS

## 2024-04-22 ENCOUNTER — Other Ambulatory Visit: Payer: Self-pay

## 2024-06-09 NOTE — Progress Notes (Incomplete)
 Cardiology Office Note:   Date:  06/09/2024  ID:  Jodi [name], DOB [birth year], MRN [id number] PCP: Patient, No Pcp Per  [facility] Providers Cardiologist:  None { Chief Complaint: No chief complaint on file.     History of Present Illness:   Jodi [name] is a 63 y.o. female with a PMH of HFpEF, pulmonary HTN, DM2, morbid obesity, bipolar disorder, TAA, and maxillary sinus CA s/p ethmoidectomy (2020) who presents as a new patient referral by Rosina Cowden for the evaluation of CHF.  Patient presents today for evaluation of HFpEF management.  She has been hospitalized multiple times over the past few years for volume overload and acute hypoxic respiratory failure.  Most recently she was hospitalized in February and March for acute decompensated HFpEF in the setting of viral illnesses.  As tried Jardiance in the past and reported SOB***. Patient was followed by [facility] PCP [facility] and was referred to cardiology for the management of recurrent CHF.   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   Moderate to severe pulmonary hypertension (HCC)    Morbid obesity (HCC)    Obesity hypoventilation syndrome (HCC)    On home oxygen therapy    PRN   Pickwickian syndrome (HCC)    Thoracic aortic aneurysm      Studies Reviewed:    EKG: ***       Cardiac Studies & Procedures   ______________________________________________________________________________________________     ECHOCARDIOGRAM  ECHOCARDIOGRAM COMPLETE 09/09/2023  Narrative ECHOCARDIOGRAM  REPORT    Patient Name:   NORTON LOA SINGLE Date of Exam: 09/09/2023 Medical Rec #:  [id number]          Height:       64.0 in Accession #:    [id number]         Weight:       344.8 lb Date of Birth:  1961/08/03         BSA:          2.466 m Patient Age:    63 years           BP:           109/57 mmHg Patient Gender: F                  HR:           64 bpm. Exam Location:  Inpatient  Procedure: 2D Echo, Cardiac Doppler, Color Doppler and Intracardiac Opacification Agent  Indications:    CHF  History:        Patient has prior history of Echocardiogram examinations, most recent 11/18/2015. CHF, Arrythmias:Bradycardia; Risk Factors:Diabetes.  Sonographer:    Juanita Shaw Referring Phys: [id number] VELNA SAUNDERS PAHWANI   Sonographer Comments: Patient is obese. Image acquisition challenging due to patient body habitus. IMPRESSIONS   1. Left ventricular ejection fraction, by estimation, is 65 to 70%. The left ventricle has normal function. The  left ventricle has no regional wall motion abnormalities. There is mild left ventricular hypertrophy. Left ventricular diastolic parameters were normal. 2. Right ventricular systolic function is normal. The right ventricular size is not well visualized. There is normal pulmonary artery systolic pressure. 3. The mitral valve was not well visualized. No evidence of mitral valve regurgitation. 4. The aortic valve is tricuspid. Aortic valve regurgitation is not visualized. No aortic stenosis is present. 5. Aortic dilatation noted. There is mild dilatation of the ascending aorta, measuring 41 mm. 6. The inferior vena cava is normal in size with greater than 50% respiratory variability, suggesting right atrial pressure of 3 mmHg.  Comparison(s): No prior study available.  FINDINGS Left Ventricle: Left ventricular ejection fraction, by estimation, is 65 to 70%. The left ventricle has normal function. The left ventricle has no regional wall motion  abnormalities. The left ventricular internal cavity size was normal in size. There is mild left ventricular hypertrophy. Left ventricular diastolic parameters were normal.  Right Ventricle: The right ventricular size is not well visualized. No increase in right ventricular wall thickness. Right ventricular systolic function is normal. There is normal pulmonary artery systolic pressure. The tricuspid regurgitant velocity is 2.61 m/s, and with an assumed right atrial pressure of 3 mmHg, the estimated right ventricular systolic pressure is 30.2 mmHg.  Left Atrium: Left atrial size was normal in size.  Right Atrium: Right atrial size was normal in size.  Pericardium: Trivial pericardial effusion is present. The pericardial effusion is posterior to the left ventricle.  Mitral Valve: The mitral valve was not well visualized. No evidence of mitral valve regurgitation. MV peak gradient, 2.7 mmHg. The mean mitral valve gradient is 1.0 mmHg.  Tricuspid Valve: The tricuspid valve is normal in structure. Tricuspid valve regurgitation is trivial. No evidence of tricuspid stenosis.  Aortic Valve: The aortic valve is tricuspid. Aortic valve regurgitation is not visualized. No aortic stenosis is present. Aortic valve mean gradient measures 5.0 mmHg. Aortic valve peak gradient measures 9.2 mmHg. Aortic valve area, by VTI measures 2.49 cm.  Pulmonic Valve: The pulmonic valve was normal in structure. Pulmonic valve regurgitation is mild. No evidence of pulmonic stenosis.  Aorta: Aortic dilatation noted. There is mild dilatation of the ascending aorta, measuring 41 mm.  Venous: The inferior vena cava is normal in size with greater than 50% respiratory variability, suggesting right atrial pressure of 3 mmHg.  IAS/Shunts: The interatrial septum was not well visualized.   LEFT VENTRICLE PLAX 2D LVIDd:         4.50 cm      Diastology LVIDs:         3.20 cm      LV e' medial:    13.10 cm/s LV PW:          1.20 cm      LV E/e' medial:  4.6 LV IVS:        1.00 cm      LV e' lateral:   9.36 cm/s LVOT diam:     2.00 cm      LV E/e' lateral: 6.5 LV SV:         83 LV SV Index:   34 LVOT Area:     3.14 cm  LV Volumes (MOD) LV vol d, MOD A4C: 149.0 ml LV vol s, MOD A4C: 50.7 ml LV SV MOD A4C:     149.0 ml  RIGHT VENTRICLE             IVC RV S  prime:     12.30 cm/s  IVC diam: 1.60 cm TAPSE (M-mode): 2.2 cm  LEFT ATRIUM             Index        RIGHT ATRIUM           Index LA diam:        2.90 cm 1.18 cm/m   RA Area:     22.60 cm LA Vol (A2C):   53.2 ml 21.57 ml/m  RA Volume:   67.60 ml  27.41 ml/m LA Vol (A4C):   74.4 ml 30.17 ml/m LA Biplane Vol: 63.5 ml 25.75 ml/m AORTIC VALVE                     PULMONIC VALVE AV Area (Vmax):    2.19 cm      PV Vmax:       1.33 m/s AV Area (Vmean):   2.21 cm      PV Peak grad:  7.0 mmHg AV Area (VTI):     2.49 cm AV Vmax:           152.00 cm/s AV Vmean:          102.000 cm/s AV VTI:            0.333 m AV Peak Grad:      9.2 mmHg AV Mean Grad:      5.0 mmHg LVOT Vmax:         106.00 cm/s LVOT Vmean:        71.800 cm/s LVOT VTI:          0.264 m LVOT/AV VTI ratio: 0.79  AORTA Ao Root diam: 3.10 cm Ao Asc diam:  3.85 cm  MITRAL VALVE               TRICUSPID VALVE MV Area (PHT): 4.49 cm    TR Peak grad:   27.2 mmHg MV Area VTI:   2.98 cm    TR Vmax:        261.00 cm/s MV Peak grad:  2.7 mmHg MV Mean grad:  1.0 mmHg    SHUNTS MV Vmax:       0.82 m/s    Systemic VTI:  0.26 m MV Vmean:      43.3 cm/s   Systemic Diam: 2.00 cm MV Decel Time: 169 msec MV E velocity: 60.80 cm/s MV A velocity: 71.10 cm/s MV E/A ratio:  0.86  Stanly Leavens MD Electronically signed by Stanly Leavens MD Signature Date/Time: 09/09/2023/12:22:51 PM    Final          ______________________________________________________________________________________________      Risk Assessment/Calculations:   {Does this patient have ATRIAL  FIBRILLATION?[phone]} No BP recorded.  {Refresh Note OR Click here to enter BP  :1}***        Physical Exam:     VS:  There were no vitals taken for this visit. ***    Wt Readings from Last 3 Encounters:  12/04/23 (!) 324 lb 11.8 oz (147.3 kg)  10/19/23 (!) 320 lb 5.3 oz (145.3 kg)  09/10/23 (!) 348 lb 1.7 oz (157.9 kg)     GEN: Well nourished, well developed, in no acute distress NECK: No JVD; No carotid bruits CARDIAC: ***RRR, no murmurs, rubs, gallops RESPIRATORY:  Clear to auscultation without rales, wheezing or rhonchi  ABDOMEN: Soft, non-tender, non-distended, normal bowel sounds EXTREMITIES:  Warm and well perfused, no edema; No deformity, 2+ radial pulses PSYCH: Normal mood  and affect   Assessment & Plan   - SGLT2? - Cardio mems?    {Are you ordering a CV Procedure (e.g. stress test, cath, DCCV, TEE, etc)?   Press F2        :[id number]}   This note was written with the assistance of a dictation microphone or AI dictation software. Please excuse any typos or grammatical errors.   Signed, Georganna Archer, MD 06/09/2024 4:10 PM    [facility]

## 2024-06-10 ENCOUNTER — Ambulatory Visit
Attending: Student in an Organized Health Care Education/Training Program | Admitting: Student in an Organized Health Care Education/Training Program

## 2024-07-15 ENCOUNTER — Other Ambulatory Visit: Payer: Self-pay

## 2024-07-16 NOTE — Telephone Encounter (Signed)
 Received a message pt wanted to cancel her appointment for today and be rescheduled. Called patient to get appointment reschedule patient refused and stated she would call me back to get it scheduled, she wasn't feeling that well.

## 2024-08-27 ENCOUNTER — Ambulatory Visit: Admitting: Student in an Organized Health Care Education/Training Program

## 2024-08-29 ENCOUNTER — Other Ambulatory Visit: Payer: Self-pay

## 2024-09-19 ENCOUNTER — Encounter: Payer: Self-pay | Admitting: Hematology

## 2024-09-23 ENCOUNTER — Encounter: Payer: Self-pay | Admitting: Hematology

## 2024-09-24 ENCOUNTER — Other Ambulatory Visit: Payer: Self-pay

## 2024-09-24 ENCOUNTER — Ambulatory Visit: Admitting: Cardiology

## 2024-10-01 ENCOUNTER — Ambulatory Visit: Admitting: Cardiology
